# Patient Record
Sex: Male | Born: 1935 | Race: Black or African American | Hispanic: No | Marital: Married | Smoking: Never smoker
Health system: Southern US, Community
[De-identification: ages and names within clinical notes are randomized; demographics above are authoritative.]

## PROBLEM LIST (undated history)

## (undated) DIAGNOSIS — C61 Malignant neoplasm of prostate: Secondary | ICD-10-CM

## (undated) HISTORY — PX: PROSTATECTOMY: SHX69

## (undated) HISTORY — PX: BACK SURGERY: SHX140

---

## 2019-07-16 ENCOUNTER — Ambulatory Visit
Admission: EM | Admit: 2019-07-16 | Discharge: 2019-07-16 | Disposition: A | Discharge status: Home / Self Care | Payer: Medicare Other | Attending: Emergency Medicine | Admitting: Emergency Medicine

## 2019-07-16 ENCOUNTER — Other Ambulatory Visit: Payer: Self-pay

## 2019-07-16 DIAGNOSIS — M7989 Other specified soft tissue disorders: Secondary | ICD-10-CM | POA: Diagnosis not present

## 2019-07-16 DIAGNOSIS — T63481A Toxic effect of venom of other arthropod, accidental (unintentional), initial encounter: Secondary | ICD-10-CM

## 2019-07-16 DIAGNOSIS — L03012 Cellulitis of left finger: Secondary | ICD-10-CM

## 2019-07-16 HISTORY — DX: Malignant neoplasm of prostate: C61

## 2019-07-16 MED ORDER — PREDNISONE 20 MG PO TABS
40.0000 mg | ORAL_TABLET | Freq: Every day | ORAL | 0 refills | Status: DC
Start: 2019-07-16 — End: 2020-10-03

## 2019-07-16 MED ORDER — DOXYCYCLINE HYCLATE 100 MG PO CAPS
100.0000 mg | ORAL_CAPSULE | Freq: Two times a day (BID) | ORAL | 0 refills | Status: DC
Start: 2019-07-16 — End: 2020-10-03

## 2019-07-16 MED ORDER — MUPIROCIN CALCIUM 2 % EX CREA
1.0000 | TOPICAL_CREAM | Freq: Two times a day (BID) | CUTANEOUS | 0 refills | Status: DC
Start: 2019-07-16 — End: 2021-02-27

## 2019-07-16 NOTE — ED Triage Notes (Signed)
Pt reports he was sitting outside on Saturday when he thinks he got stung or bitten by something. A little knot on his left hand index finger. Began to turn red and swell. Warm to touch and tender.

## 2019-07-16 NOTE — Discharge Instructions (Signed)
Take medication as prescribed. Rest. Drink plenty of fluids.  Elevate.  Monitor.  Follow up with your primary care physician this week as needed. Return to Urgent care for new or worsening concerns.

## 2019-07-16 NOTE — ED Provider Notes (Signed)
[facility] ____________________________________________  Time seen: Approximately 1:02 PM  I have reviewed the triage vital signs and the nursing notes.   HISTORY  Chief Complaint Finger Swelling   HPI Jamie Brennan is a 86 y.o. male presenting for evaluation of left index finger swelling.  Patient reports this past Saturday afternoon he was sitting outside and felt a bite or sting to his left index finger, and reports when he looked he saw a small dot that was itchy and somewhat swollen.  Reports as that day progressed he had increased swelling to the same area.  Reports swelling is still present.  States somewhat itchy and somewhat red.  Denies any drainage.  Denies any paresthesias, decreased strength.  Reports some decrease movement due to swelling.  Denies fall, or other injury.  Reports immunizations are up-to-date.  Denies diabetes, renal insufficiency or recent antibiotic use.  Reports otherwise doing well.  Denies chest pain, shortness of breath, other edema.  [place]P: in [place]   Past Medical History:  Diagnosis Date   Prostate cancer ([place])     There are no problems to display for this patient.   Past Surgical History:  Procedure Laterality Date   BACK SURGERY     PROSTATECTOMY       No current facility-administered medications for this encounter.  Current Outpatient Medications:    doxycycline (VIBRAMYCIN) 100 MG capsule, Take 1 capsule (100 mg total) by mouth 2 (two) times daily., Disp: 20 capsule, Rfl: 0   mupirocin cream (BACTROBAN) 2 %, Apply 1 application topically 2 (two) times daily. For 5 days, Disp: 15 g, Rfl: 0   predniSONE (DELTASONE) 20 MG tablet, Take 2 tablets (40 mg total) by mouth daily., Disp: 10 tablet, Rfl: 0  Allergies Patient has no known allergies.  History reviewed. No pertinent family history.  Social History Social History   Tobacco Use   Smoking status: Never Smoker   Smokeless tobacco: Never Used  Brewing technologist Use: Never used  Substance Use Topics   Alcohol use: Not Currently   Drug use: Never    Review of Systems Constitutional: No fever ENT: Denies difficulty swallowing or throat swelling. Cardiovascular: Denies chest pain. Respiratory: Denies shortness of breath. Gastrointestinal: No abdominal pain.  Skin: Positive skin changes.  ____________________________________________   PHYSICAL EXAM:  VITAL SIGNS: ED Triage Vitals  Enc Vitals Group     BP 07/16/19 1222 (!) 160/82     Pulse Rate 07/16/19 1222 74     Resp 07/16/19 1222 17     Temp 07/16/19 1222 97.8 F (36.6 C)     Temp Source 07/16/19 1222 Oral     SpO2 07/16/19 1222 100 %     Weight 07/16/19 1220 145 lb (65.8 kg)     Height 07/16/19 1220 5\' 8"  (1.727 m)     Head Circumference --      Peak Flow --      Pain Score 07/16/19 1219 5     Pain Loc --      Pain Edu? --      Excl. in [place]? --     Constitutional: Alert and oriented. Well appearing and in no acute distress. Eyes: Conjunctivae are normal. ENT      Head: Normocephalic and atraumatic. Cardiovascular: Good peripheral circulation. Respiratory: Normal respiratory effort without tachypnea nor retractions. Musculoskeletal: Steady gait. Neurologic:  Normal speech and language.  Speech is normal. No gait instability.  Skin:  Skin is warm, dry.  Except: Left index finger lateral aspect of middle phalanx punctum present without foreign body noted with surrounding dorsal edema, no circumferential edema, dorsal erythema present, no circumferential erythema, no point bony tenderness, mildly diffuse tender, good distal resisted flexion and extension, normal distal sensation and capillary refill.  Left hand otherwise nontender. Psychiatric: Mood and affect are normal. Speech and behavior are normal. Patient exhibits appropriate insight and judgment   ___________________________________________   LABS (all labs ordered are listed, but only abnormal results are  displayed)  Labs Reviewed - No data to display ____________________________________________   PROCEDURES Procedures    INITIAL IMPRESSION / ASSESSMENT AND PLAN / ED COURSE  Pertinent labs & imaging results that were available during my care of the patient were reviewed by me and considered in my medical decision making (see chart for details).  Well-appearing patient.  No acute distress.  Suspect left index finger local reaction from insect bite or sting with concern of secondary cellulitis.  Will treat with oral doxycycline, topical Bactroban and oral prednisone.  Encouraged elevation, cool compress and monitoring.  Discussed follow-up and return parameters.Discussed indication, risks and benefits of medications with patient.   Discussed follow up with Primary care physician this week. Discussed follow up and return parameters including no resolution or any worsening concerns. Patient verbalized understanding and agreed to plan.   ____________________________________________   FINAL CLINICAL IMPRESSION(S) / ED DIAGNOSES  Final diagnoses:  Swelling of left index finger  Cellulitis of left index finger  Insect stings, accidental or unintentional, initial encounter     ED Discharge Orders         Ordered    predniSONE (DELTASONE) 20 MG tablet  Daily     Discontinue  Reprint     07/16/19 1301    doxycycline (VIBRAMYCIN) 100 MG capsule  2 times daily     Discontinue  Reprint     07/16/19 1301    mupirocin cream (BACTROBAN) 2 %  2 times daily     Discontinue  Reprint     07/16/19 1301           Note: This dictation was prepared with Dragon dictation along with smaller phrase technology. Any transcriptional errors that result from this process are unintentional.         [name], NP 07/16/19 1349

## 2020-10-03 ENCOUNTER — Encounter: Payer: Self-pay | Admitting: Urology

## 2020-10-03 ENCOUNTER — Ambulatory Visit: Payer: Medicare Other | Admitting: Urology

## 2020-10-03 ENCOUNTER — Other Ambulatory Visit: Payer: Self-pay

## 2020-10-03 VITALS — BP 130/78 | HR 71 | Ht 70.0 in | Wt 145.0 lb

## 2020-10-03 DIAGNOSIS — N320 Bladder-neck obstruction: Secondary | ICD-10-CM

## 2020-10-03 DIAGNOSIS — R339 Retention of urine, unspecified: Secondary | ICD-10-CM | POA: Diagnosis not present

## 2020-10-03 DIAGNOSIS — Z8546 Personal history of malignant neoplasm of prostate: Secondary | ICD-10-CM | POA: Diagnosis not present

## 2020-10-03 NOTE — Progress Notes (Signed)
10/03/2020 9:23 AM   Jamie Brennan Dec 22, 1931 [id number]  Referring provider: No referring provider defined for this encounter.  Chief Complaint  Patient presents with   Hematuria    HPI: Jamie Brennan presents for follow-up of a recent episode of hematuria, urinary retention and bilateral hydronephrosis.  Hospitalized in [place] late August 2022 after a syncopal episode/fall.  Creatinine was 3.3 and ultrasound with bilateral hydronephrosis and bladder distention Foley catheter was unable to be placed and was seen by urology with Foley catheter placement under cystoscopic guidance 09/09/2020 Cystoscopy note was reviewed and was noted to have a bladder neck contracture which was dilated over a wire to 24 [place] and a 20 [place] catheter was placed Follow-up CT 09/10/2020 with minimal bilateral hydronephrosis Prior history of prostate cancer s/p radical retropubic prostatectomy 2004 and subsequent radiation Hematuria resolved and Foley was removed prior to discharge on 09/14/2020 Discharge to rehab and was transported to the ED 09/17/2020 for hematuria and a creatinine of 2.5.  CT showed severe bilateral hydronephrosis and a distended bladder.  A Foley catheter was unable to be placed however was placed by urology. His niece lives locally.  He has no family and is living with her until he is able to return to [place] He has had no problems with the indwelling catheter   PMH: Past Medical History:  Diagnosis Date   Prostate cancer [facility])     Surgical History: Past Surgical History:  Procedure Laterality Date   BACK SURGERY     PROSTATECTOMY      Home Medications:  Allergies as of 10/03/2020   No Known Allergies      Medication List        Accurate as of October 03, 2020  9:23 AM. If you have any questions, ask your nurse or doctor.          STOP taking these medications    doxycycline 100 MG capsule Commonly known as: VIBRAMYCIN Stopped  by: Abbie Sons, MD   predniSONE 20 MG tablet Commonly known as: DELTASONE Stopped by: Abbie Sons, MD       TAKE these medications    mupirocin cream 2 % Commonly known as: BACTROBAN Apply 1 application topically 2 (two) times daily. For 5 days        Allergies: No Known Allergies  Family History: No family history on file.  Social History:  reports that he has never smoked. He has never used smokeless tobacco. He reports that he does not currently use alcohol. He reports that he does not use drugs.   Physical Exam: BP 130/78   Pulse 71   Ht 5\' 10"  (1.778 m)   Wt 145 lb (65.8 kg)   BMI 20.81 kg/m   Constitutional:  Alert and oriented, No acute distress. HEENT: [place] AT, moist mucus membranes.  Trachea midline, no masses. Cardiovascular: No clubbing, cyanosis, or edema. Respiratory: Normal respiratory effort, no increased work of breathing. GI: Abdomen is soft, nontender, nondistended, no abdominal masses GU: No CVA tenderness Lymph: No cervical or inguinal lymphadenopathy. Skin: No rashes, bruises or suspicious lesions. Neurologic: Grossly intact, no focal deficits, moving all 4 extremities. Psychiatric: Normal mood and affect.   Assessment & Plan:    1.  Urinary retention Since it is Friday did not recommend catheter removal today and he will follow-up next week for catheter removal/voiding trial  2.  Bladder neck contracture  3.  History prostate cancer   Abbie Sons, MD  [facility] [address] [place], [place] [address] [phone]

## 2020-10-06 ENCOUNTER — Ambulatory Visit: Payer: Medicare Other | Admitting: Physician Assistant

## 2020-10-06 ENCOUNTER — Other Ambulatory Visit: Payer: Self-pay

## 2020-10-06 DIAGNOSIS — R339 Retention of urine, unspecified: Secondary | ICD-10-CM | POA: Diagnosis not present

## 2020-10-06 LAB — BLADDER SCAN AMB NON-IMAGING

## 2020-10-06 NOTE — Progress Notes (Signed)
   10/06/2020 10:09 AM   Jamie Brennan [birth year] [id number]  CC: Chief Complaint  Patient presents with   Urinary Retention   HPI: Jamie Brennan is a 87 y.o. male with PMH prostate cancer and a recent history of recurrent urinary retention with bilateral hydronephrosis and hematuria with cystoscopy findings of bladder neck contracture, now s/p dilation who presents today for voiding trial.  He is accompanied today by his niece, who contributes to HPI.  Today he reports feeling well with no acute concerns.  Foley catheter removed in clinic in the morning, see procedure note for details.  Patient returns to clinic in the afternoon.  He drank at least 24 ounces of fluid and has been able to urinate.  PVR 60mL.  PMH: Past Medical History:  Diagnosis Date   Prostate cancer [facility])     Surgical History: Past Surgical History:  Procedure Laterality Date   BACK SURGERY     PROSTATECTOMY      Home Medications:  Allergies as of 10/06/2020   No Known Allergies      Medication List        Accurate as of October 06, 2020 10:09 AM. If you have any questions, ask your nurse or doctor.          mupirocin cream 2 % Commonly known as: BACTROBAN Apply 1 application topically 2 (two) times daily. For 5 days        Allergies:  No Known Allergies  Family History: No family history on file.  Social History:   reports that he has never smoked. He has never used smokeless tobacco. He reports that he does not currently use alcohol. He reports that he does not use drugs.  Physical Exam: There were no vitals taken for this visit.  Constitutional:  Alert and oriented, no acute distress, nontoxic appearing HEENT: [place], AT Cardiovascular: No clubbing, cyanosis, or edema Respiratory: Normal respiratory effort, no increased work of breathing Skin: No rashes, bruises or suspicious lesions Neurologic: Grossly intact, no focal deficits, moving all 4 extremities Psychiatric:  Normal mood and affect  Laboratory Data: Results for orders placed or performed in visit on 10/06/20  BLADDER SCAN AMB NON-IMAGING  Result Value Ref Range   Scan Result 70mL    Catheter Removal  Patient is present today for a catheter removal.  21ml of water was drained from the balloon. A 20FR Council tip foley cath was removed from the bladder no complications were noted . Patient tolerated well.  Performed by: Debroah Loop, PA-C   Assessment & Plan:   1. Urinary retention Voiding trial passed.  We will bring him back in 1 month to recheck his residual.  In the interim, we discussed return precautions including difficulty urinating, lower abdominal pain, and abdominal distention.  He expressed understanding. - BLADDER SCAN AMB NON-IMAGING   Return in about 4 weeks (around 11/03/2020) for Repeat PVR.  Debroah Loop, PA-C  [facility] [address] [place], [place] [address] [phone]

## 2020-11-03 ENCOUNTER — Ambulatory Visit: Payer: Medicare Other | Admitting: Physician Assistant

## 2020-11-04 ENCOUNTER — Encounter: Payer: Self-pay | Admitting: Physician Assistant

## 2020-11-11 ENCOUNTER — Ambulatory Visit: Payer: Medicare Other | Attending: Internal Medicine

## 2020-11-11 ENCOUNTER — Encounter: Payer: Self-pay | Admitting: Physical Therapy

## 2020-11-11 DIAGNOSIS — R2689 Other abnormalities of gait and mobility: Secondary | ICD-10-CM | POA: Insufficient documentation

## 2020-11-11 NOTE — Therapy (Addendum)
[facility] [address], [place], [address] Phone: [phone]   Fax:  [phone]  Physical Therapy Evaluation  Patient Details  Name: Jamie Brennan MRN: [id number] Date of Birth: Aug 26, 1931 No data recorded  Encounter Date: 11/11/2020   PT End of Session - 11/11/20 1551     Visit Number 1    Number of Visits 17    Date for PT Re-Evaluation 01/06/21    Authorization Type BCBS Medicare    Authorization Time Period 11/11/20-01/06/21    Authorization - Visit Number --    Progress Note Due on Visit 10    PT Start Time 1300    PT Stop Time 1348    PT Time Calculation (min) 48 min    Equipment Utilized During Treatment Gait belt    Activity Tolerance Patient tolerated treatment well    Behavior During Therapy WFL for tasks assessed/performed             Past Medical History:  Diagnosis Date   Prostate cancer [facility])     Past Surgical History:  Procedure Laterality Date   BACK SURGERY     PROSTATECTOMY      There were no vitals filed for this visit.    Subjective Assessment - 11/11/20 1259     Subjective Jamie Brennan is a 87 year old male presenting with conerns of LE weakness and balance following a fall on 09/06/20 and Covid-19/rhabdomyolysis diagnosis. Pt reports falling outside while trying to pick up a "piece of bread." Pt reports not having a history of falling other than one fall approximately 1 year prior attibuted to pneumonia. He presents today amb with a RW that his niece reports he does not use frequently. [name]. He lives in a multilevel home with 10 steps to enter with railing on L side. Patient reports he was independent with all ADLs prior to falling. He currently reports independence in bathing, dressing, cooking, and cleaning. He is not currently driving. He is currently living with his niece who is his  primary caregiver. He reports has a PMH of prostate cancer, prostectomy, previous lumbar surgery, and difficulty hearing. Pt denies any unexplained weight fluctuation, saddle paresthesia, loss of bowel/bladder function, or unrelenting night pain at this time.    Patient is accompained by: Family member   Niece   Pertinent History Jamie Brennan is a 87 year old male presenting with conerns of LE weakness and balance following a fall on 09/06/20 and Covid-19/rhabdomyolysis diagnosis. Pt reports falling outside while trying to pick up a "piece of bread." Pt reports not having a history of falling other than one fall approximately 1 year prior attibuted to pneumonia. He presents today amb with a RW that his niece reports he does not use frequently. [name]. He lives in a multilevel home with 10 steps to enter with railing on L side. Patient reports he was independent with all ADLs prior to falling. He currently reports independence in bathing, dressing, cooking, cleaning. He is not currently driving. He is currently living with his niece who is his primary caregiver. He reports has a PMH of prostate cancer, prostectomy, previous lumbar surgery, and difficulty hearing. Pt denies any unexplained weight fluctuation, saddle paresthesia, loss of bowel/bladder function, or unrelenting night pain at this time.    Limitations Walking;Lifting;House hold activities  How long can you sit comfortably? hours    How long can you stand comfortably? 30-86min    How long can you walk comfortably? 30-45    Patient Stated Goals Living independent    Currently in Pain? No/denies              Hip AROM L/R  [facility] except extension approximately 50% limited bilaterally   Hip MMT L/R  Flex 4/4  Abd 4-/4-  Add 5/5  IR 4+/4+  ER 4/4  Knee MMT L/R  Flex: 4+/4+ Ext: 5/5   Knee AROM  WFL   Ankle AROM  WFL  Ankle MMT  WFL  Mental  Status Patient's fund of knowledge is within normal limits for educational level.  Sensation Grossly intact to light touch bilateral LEs as determined by testing dermatomes L2-S2 respectively except at L4 on LLE Proprioception and hot/cold testing deferred on this date  Posture  Rounded shoulders, forward head, decreased lumbar lordosis, increased thoracic kyphosis  Test and Measures:  TUG: 16 sec with bilat UE support to achieve standing  5xSTS: 16.17 sec with bilat UE support to achieve standing  10MWT:14.21 sec = 0.70 m/s  Picking object from floor: CGA with evidenced bilat knee valgus with squatting  Standing unsupported: able to stand > 1 min without support  Gait: scissoring gait, decreased speed, decreased step length/width, increased lateral trunk leaning  Stair negotiation: step to pattern with reliance of moderate BUE support to maintain balance and lack of eccentric control  Single leg balance: unable to perform     Objective measurements completed on examination: See above findings.      Ther-Ex PT reviewed the following HEP with patient with patient able to demonstrate a set of the following with min cuing for correction needed. PT educated patient on parameters of therex (how/when to inc/decrease intensity, frequency, rep/set range, stretch hold time, and purpose of therex) with verbalized understanding.    Sit to stands 3 x 8-10 reps 7days/week Standing Hip ABD 3 x 8-10 reps 7day/week Walking with RW  7days/week   *Patient educated on risk of falling and need of utilization of RW for safety with household and community ambulation. PT adjusted RW height and cued patient for correct walker proximity.        PT Education - 11/11/20 1552     Education Details Patient was educated on diagnosis, anatomy and pathology involved, prognosis, role of PT, and was given an HEP, demonstrating exercise with proper form following verbal and tactile cues, and was  given a paper hand out to continue exercise at home. Pt was educated on and agreed to plan of care.    Person(s) Educated Patient;Caregiver(s)    Methods Explanation;Demonstration    Comprehension Verbalized understanding;Returned demonstration              PT Short Term Goals - 11/11/20 1715       PT SHORT TERM GOAL #1   Title Pt will demonstrate independence with HEP to improve LE strength and balance for increased ability to participate with ADLs    Baseline HEP given    Time 4    Period Weeks    Status New    Target Date 12/09/20               PT Long Term Goals - 11/11/20 1718       PT LONG TERM GOAL #1   Title Patient will increase FOTO score to ** to indicate predicted increase in  functional mobility to complete ADLs    Baseline 11/11/20:    Time 8    Period Weeks    Status New    Target Date 01/06/21      PT LONG TERM GOAL #2   Title Pt will increase gait speed in 10MWT to >0.55m/s to demonstrate appropriate speed for limited community ambulation    Baseline 11/11/20: 0.70 m/s    Time 8    Period Weeks    Status New    Target Date 01/06/21      PT LONG TERM GOAL #3   Title Pt will perform 5XSTS within 12 seconds without UE support in order to demonstrate improved LE strength and decrease the likelihood of falling    Baseline 11/11/20: 16 sec BUE support                    Plan - 11/11/20 1635     Clinical Impression Statement Patient is a pleasant 87 y.o. male referred to PT for gait training and balance. Pt presents with decreased LE strength, decreased hip ROM, decreased activity tolerance, balance, abnormal gait, and postural dysfunction. Activity limitations with walking, standing, stair negotiation, sit to stand transfer, and driving prevent full participation with ADLs and community activities. Patient is at increased fall risk evidenced by TUG and 5XSTS and will benefit from skilled PT to address these impairments, achieve set goals, and  decrease likelihood of falling.    Personal Factors and Comorbidities Age;Comorbidity 1    Comorbidities prostate cancer    Examination-Activity Limitations Bathing;Dressing;Stairs;Locomotion Level;Stand;Lift;Transfers    Examination-Participation Restrictions Community Activity;Yard Work;Cleaning;Meal Prep;Driving    Stability/Clinical Decision Making Evolving/Moderate complexity    Clinical Decision Making Moderate    Rehab Potential Fair    PT Frequency 2x / week    PT Duration 8 weeks    PT Treatment/Interventions ADLs/Self Care Home Management;Cryotherapy;Traction;Gait training;Stair training;Functional mobility training;Therapeutic activities;Neuromuscular re-education;Balance training;Therapeutic exercise;Patient/family education;Manual techniques;Passive range of motion;Dry needling    PT Next Visit Plan review HEP/FGA/BERG goal    PT Home Exercise Plan Sit to Stands, standing hip ABD, walking with RW    Consulted and Agree with Plan of Care Patient             Patient will benefit from skilled therapeutic intervention in order to improve the following deficits and impairments:  Abnormal gait, Decreased balance, Decreased endurance, Decreased mobility, Difficulty walking, Pain, Postural dysfunction, Decreased strength, Decreased safety awareness, Decreased activity tolerance, Improper body mechanics, Decreased range of motion  Visit Diagnosis: Other abnormalities of gait and mobility     Problem List There are no problems to display for this patient.  Jamie Brennan, SPT  Jamie Brennan, Student-PT 11/11/2020, 5:33 PM  [facility] [address], [place], [address] Phone: [phone]   Fax:  [phone]  Name: Jamie Brennan MRN: [id number] Date of Birth: 1931/05/09

## 2020-11-18 ENCOUNTER — Ambulatory Visit: Payer: Medicare Other | Admitting: Physical Therapy

## 2020-11-18 ENCOUNTER — Encounter: Payer: Self-pay | Admitting: Physical Therapy

## 2020-11-18 DIAGNOSIS — R2689 Other abnormalities of gait and mobility: Secondary | ICD-10-CM | POA: Diagnosis not present

## 2020-11-18 NOTE — Therapy (Signed)
[name] [address], [place], [address] Phone: [phone]   Fax:  [phone]  Physical Therapy Treatment  Patient Details  Name: Jamie Brennan MRN: [id number] Date of Birth: 08-27-1931 Referring Provider (PT): Tama High III   Encounter Date: 11/18/2020   PT End of Session - 11/18/20 1645     Visit Number 2    Number of Visits 17    Date for PT Re-Evaluation 01/06/21    Authorization Type BCBS Medicare    Authorization Time Period 11/11/20-01/06/21    Authorization - Visit Number 2    Progress Note Due on Visit 10    PT Start Time 1300    PT Stop Time 1340    PT Time Calculation (min) 40 min    Equipment Utilized During Treatment Gait belt    Activity Tolerance Patient tolerated treatment well    Behavior During Therapy WFL for tasks assessed/performed             Past Medical History:  Diagnosis Date   Prostate cancer [facility])     Past Surgical History:  Procedure Laterality Date   BACK SURGERY     PROSTATECTOMY      There were no vitals filed for this visit.   Subjective Assessment - 11/18/20 1309     Subjective Pt reports active participation with HEP. He    Pertinent History Jamie Brennan is a 87 year old male presenting with conerns of LE weakness and balance following a fall on 09/06/20 and Covid-19/rhabdomyolysis diagnosis. Pt reports falling outside while trying to pick up a "piece of bread." Pt reports not having a history of falling other than one fall approximately 1 year prior attibuted to pneumonia. He presents today amb with a RW that his niece reports he does not use frequently. [name]. He lives in a multilevel home with 10 steps to enter with railing on L side. Patient reports he was independent with all ADLs prior to falling. He currently reports independence in bathing, dressing, cooking,  cleaning. He is not currently driving. He is currently living with his niece who is his primary caregiver. He reports has a PMH of prostate cancer, prostectomy, previous lumbar surgery, and difficulty hearing. Pt denies any unexplained weight fluctuation, saddle paresthesia, loss of bowel/bladder function, or unrelenting night pain at this time.    Limitations Walking;Lifting;House hold activities    How long can you sit comfortably? hours    How long can you stand comfortably? 30-40min    How long can you walk comfortably? 30-45    Patient Stated Goals Living independent            Therex   Nu step level 2 x 5 min seat 9 SPM >80  Sit to Stands 2 x 10 reps with cues for decreased use of UE support and pushing off with knee to assist  Standing Hip ABD 2 x 10 reps with cueing and demonstration to complete with good carry over  Therapeutic Activity Family Education/Training: Stair negotiation x 4 trials x 4 6'" steps; PT explained, demonstrated proper guarding, form, and positioning with patient caregiver able to return demonstration and verbalize understanding. Pt and caregiver educated on guarding from behind going up/ and from the front going going down.   Dynamic gait testing FGA: 11/30 score Education on findings from FGA and carry over into real life gait  challenges                    PT Education - 11/18/20 1659     Education Details Pt and caregiver educated on safe stair negotiation.    Person(s) Educated Patient;Caregiver(s)    Methods Explanation;Demonstration    Comprehension Verbalized understanding;Returned demonstration              PT Short Term Goals - 11/11/20 1715       PT SHORT TERM GOAL #1   Title Pt will demonstrate independence with HEP to improve LE strength and balance for increased ability to participate with ADLs    Baseline HEP given    Time 4    Period Weeks    Status New    Target Date 12/09/20               PT Long  Term Goals - 11/18/20 1709       PT LONG TERM GOAL #1   Title Patient will increase FOTO score to ** to indicate predicted increase in functional mobility to complete ADLs    Baseline 11/11/20:    Time 8    Period Weeks    Status New      PT LONG TERM GOAL #2   Title Pt will increase gait speed in 10MWT to >0.62m/s to demonstrate appropriate speed for limited community ambulation    Baseline 11/11/20: 0.70 m/s    Time 8    Period Weeks    Status New      PT LONG TERM GOAL #3   Title Pt will perform 5XSTS within 12 seconds without UE support in order to demonstrate improved LE strength and decrease the likelihood of falling    Baseline 11/11/20: 16 sec BUE support    Time 8    Period Weeks    Status New    Target Date 01/06/21      PT LONG TERM GOAL #4   Title Patient will demonstrate decreased risk for falls with function gait assessment by performing 15/30. (4+MCID)    Baseline 11/18/20: 11/30    Time 8    Period Weeks    Status New    Target Date 01/06/21                   Plan - 11/18/20 1648     Clinical Impression Statement PT initiated LE strengthening by reviewing patient HEP. PT also performed FGA with patient scoring 11/30 indicating increased fall risk in community-dwelling older adults. PT also educated caregiver in safely guarding patient with negotiation of stairs. PT explained, demonstrated proper guarding, form, and positioning with patient caregiver able to return demonstration and verbalize understanding. Pt is very motivated and able to correct with multimodal cueing. Pt will continue to benefit from skilled PT to achieve set goals. Continue PT POC.    Personal Factors and Comorbidities Age;Comorbidity 1    Comorbidities prostate cancer    Examination-Activity Limitations Bathing;Dressing;Stairs;Locomotion Level;Stand;Lift;Transfers    Examination-Participation Restrictions Community Activity;Yard Work;Cleaning;Meal Prep;Driving    Stability/Clinical  Decision Making Evolving/Moderate complexity    Clinical Decision Making Moderate    Rehab Potential Fair    PT Frequency 2x / week    PT Duration 8 weeks    PT Treatment/Interventions ADLs/Self Care Home Management;Cryotherapy;Traction;Gait training;Stair training;Functional mobility training;Therapeutic activities;Neuromuscular re-education;Balance training;Therapeutic exercise;Patient/family education;Manual techniques;Passive range of motion;Dry needling    PT Next Visit Plan Gait training, balance, strength training    PT Home  Exercise Plan Sit to Stands, standing hip ABD, walking with RW    Consulted and Agree with Plan of Care Patient             Patient will benefit from skilled therapeutic intervention in order to improve the following deficits and impairments:  Abnormal gait, Decreased balance, Decreased endurance, Decreased mobility, Difficulty walking, Pain, Postural dysfunction, Decreased strength, Decreased safety awareness, Decreased activity tolerance, Improper body mechanics, Decreased range of motion  Visit Diagnosis: Other abnormalities of gait and mobility     Problem List There are no problems to display for this patient.    Durwin Reges DPT Sharion Settler, SPT  Durwin Reges, PT 11/19/2020, 10:16 AM  [facility] [address], [place], [address] Phone: [phone]   Fax:  [phone]  Name: Jamie Brennan MRN: [id number] Date of Birth: 1931-11-26

## 2020-11-20 ENCOUNTER — Ambulatory Visit: Payer: Medicare Other | Admitting: Physical Therapy

## 2020-11-20 ENCOUNTER — Encounter: Payer: Self-pay | Admitting: Physical Therapy

## 2020-11-20 DIAGNOSIS — R2689 Other abnormalities of gait and mobility: Secondary | ICD-10-CM

## 2020-11-20 NOTE — Therapy (Signed)
[facility] [address], [place], [address] Phone: [phone]   Fax:  [phone]  Physical Therapy Treatment  Patient Details  Name: Jamie Brennan MRN: [id number] Date of Birth: October 21, 1931 Referring Provider (PT): Tama High III   Encounter Date: 11/20/2020   PT End of Session - 11/20/20 1523     Visit Number 3    Number of Visits 17    Date for PT Re-Evaluation 01/06/21    Authorization Type BCBS Medicare    Authorization Time Period 11/11/20-01/06/21    Authorization - Visit Number 3    Progress Note Due on Visit 10    PT Start Time 9924    PT Stop Time 1558    PT Time Calculation (min) 41 min    Equipment Utilized During Treatment Gait belt    Activity Tolerance Patient tolerated treatment well    Behavior During Therapy WFL for tasks assessed/performed             Past Medical History:  Diagnosis Date   Prostate cancer [facility])     Past Surgical History:  Procedure Laterality Date   BACK SURGERY     PROSTATECTOMY      There were no vitals filed for this visit.   Subjective Assessment - 11/20/20 1536     Subjective Pt reports feeling well and denies having any falls.    Pertinent History Jamie Brennan is a 87 year old male presenting with conerns of LE weakness and balance following a fall on 09/06/20 and Covid-19/rhabdomyolysis diagnosis. Pt reports falling outside while trying to pick up a "piece of bread." Pt reports not having a history of falling other than one fall approximately 1 year prior attibuted to pneumonia. He presents today amb with a RW that his niece reports he does not use frequently. [name]. He lives in a multilevel home with 10 steps to enter with railing on L side. Patient reports he was independent with all ADLs prior to falling. He currently reports independence in bathing, dressing,  cooking, cleaning. He is not currently driving. He is currently living with his niece who is his primary caregiver. He reports has a PMH of prostate cancer, prostectomy, previous lumbar surgery, and difficulty hearing. Pt denies any unexplained weight fluctuation, saddle paresthesia, loss of bowel/bladder function, or unrelenting night pain at this time.    Limitations Walking;Lifting;House hold activities    How long can you sit comfortably? hours    How long can you stand comfortably? 30-36min    How long can you walk comfortably? 30-45    Patient Stated Goals Living independent             Therex   Nu Step L2 x 5 min seat Level 9 for LE strengthening   Alternating toe tap to 8" step with emphasis on increased dorsiflexion 2 x 10-12 reps each LE  Sit to Stands 1 x 10 reps without UE use; cue to decreased LE assist on edge of chair  Lateral step downs from 4' step 2 x 10 reps each LE with cues for eccentric control; patient continues to rely heavy on single UE and lack eccentric control  Sit to Stands 1 x 10 reps without UE use   Therapeutic Activity     Ambulation (approximately 287ft) on level, unlevel side walk, ramp , and 5 8" steps with utilization of 1  handrail. Pt needed CGA to safely negotiate surfaces; Pt demonstrated slow shuffling gait with decreases dorsiflexion and foot clearance L>R through swing phase of gait. Able to correct for short periods of time for cuing,cuing needed for all clearings (curbs, sidewalk cracks, doorway entry) Pt also demonstrated evidence of imbalance with increased deviations needing CGA for safety   Stair negotiation 12 4" steps with supervision<>CGA with reciprocal gait pattern and single UE support                   PT Education - 11/20/20 1615     Education Details Pt educated on step height and toe clearance and importance to increase safety with walking.    Person(s) Educated Patient    Methods  Explanation;Demonstration    Comprehension Verbalized understanding;Returned demonstration              PT Short Term Goals - 11/11/20 1715       PT SHORT TERM GOAL #1   Title Pt will demonstrate independence with HEP to improve LE strength and balance for increased ability to participate with ADLs    Baseline HEP given    Time 4    Period Weeks    Status New    Target Date 12/09/20               PT Long Term Goals - 11/18/20 1709       PT LONG TERM GOAL #1   Title Patient will increase FOTO score to ** to indicate predicted increase in functional mobility to complete ADLs    Baseline 11/11/20:    Time 8    Period Weeks    Status New      PT LONG TERM GOAL #2   Title Pt will increase gait speed in 10MWT to >0.58m/s to demonstrate appropriate speed for limited community ambulation    Baseline 11/11/20: 0.70 m/s    Time 8    Period Weeks    Status New      PT LONG TERM GOAL #3   Title Pt will perform 5XSTS within 12 seconds without UE support in order to demonstrate improved LE strength and decrease the likelihood of falling    Baseline 11/11/20: 16 sec BUE support    Time 8    Period Weeks    Status New    Target Date 01/06/21      PT LONG TERM GOAL #4   Title Patient will demonstrate decreased risk for falls with function gait assessment by performing 15/30. (4+MCID)    Baseline 11/18/20: 11/30    Time 8    Period Weeks    Status New    Target Date 01/06/21                   Plan - 11/20/20 1616     Clinical Impression Statement PT continued LE strengthening and gaiti/stair training in order to promote safe navigation at home and community. Pt educated patient on importance of step height and toe clearance for safe ambulation. Pt continues to demonstrate slow shuffling gait with decreased dorsiflexion and foot clearance L>R through swing phase of gait. Pt also demonstrated evidence of imbalance with increased deviations on unlevel surfaces needing  CGA. Continue PT POC as able.    Personal Factors and Comorbidities Age;Comorbidity 1    Comorbidities prostate cancer    Examination-Activity Limitations Bathing;Dressing;Stairs;Locomotion Level;Stand;Lift;Transfers    Examination-Participation Restrictions Community Activity;Yard Work;Cleaning;Meal Prep;Driving    Stability/Clinical Decision Making Evolving/Moderate complexity  Clinical Decision Making Moderate    Rehab Potential Fair    PT Frequency 2x / week    PT Duration 8 weeks    PT Treatment/Interventions ADLs/Self Care Home Management;Cryotherapy;Traction;Gait training;Stair training;Functional mobility training;Therapeutic activities;Neuromuscular re-education;Balance training;Therapeutic exercise;Patient/family education;Manual techniques;Passive range of motion;Dry needling    PT Next Visit Plan Gait training, balance, strength training    PT Home Exercise Plan Sit to Stands, standing hip ABD, walking with RW    Consulted and Agree with Plan of Care Patient             Patient will benefit from skilled therapeutic intervention in order to improve the following deficits and impairments:  Abnormal gait, Decreased balance, Decreased endurance, Decreased mobility, Difficulty walking, Pain, Postural dysfunction, Decreased strength, Decreased safety awareness, Decreased activity tolerance, Improper body mechanics, Decreased range of motion  Visit Diagnosis: Other abnormalities of gait and mobility     Problem List There are no problems to display for this patient.   Durwin Reges DPT Sharion Settler, SPT  Durwin Reges, PT 11/21/2020, 9:57 AM  [facility] [address], [place], [address] Phone: [phone]   Fax:  [phone]  Name: Jamie Brennan MRN: [id number] Date of Birth: 1931/11/07

## 2020-11-25 ENCOUNTER — Ambulatory Visit: Payer: Medicare Other | Admitting: Physical Therapy

## 2020-11-25 ENCOUNTER — Encounter: Payer: Self-pay | Admitting: Physical Therapy

## 2020-11-25 DIAGNOSIS — R2689 Other abnormalities of gait and mobility: Secondary | ICD-10-CM | POA: Diagnosis not present

## 2020-11-25 NOTE — Therapy (Signed)
[facility] [address] [place], [address] Phone: [phone]   Fax:  [phone]  Physical Therapy Treatment  Patient Details  Name: Jamie Brennan MRN: [id number] Date of Birth: [birth year] Referring Provider (PT): Tama High III   Encounter Date: 11/25/2020   PT End of Session - 11/25/20 1349     Visit Number 4    Number of Visits 17    Date for PT Re-Evaluation 01/06/21    Authorization Type BCBS Medicare    Authorization Time Period 11/11/20-01/06/21    Authorization - Visit Number 4    Progress Note Due on Visit 10    PT Start Time 5400    PT Stop Time 1423    PT Time Calculation (min) 38 min    Equipment Utilized During Treatment Gait belt    Activity Tolerance Patient tolerated treatment well    Behavior During Therapy WFL for tasks assessed/performed             Past Medical History:  Diagnosis Date   Prostate cancer [facility])     Past Surgical History:  Procedure Laterality Date   BACK SURGERY     PROSTATECTOMY      There were no vitals filed for this visit.   Subjective Assessment - 11/25/20 1347     Subjective Pt reports feeling well, denies having any falls, and is active in HEP.    Pertinent History Jamie Brennan is a 87 year old male presenting with conerns of LE weakness and balance following a fall on 09/06/20 and Covid-19/rhabdomyolysis diagnosis. Pt reports falling outside while trying to pick up a "piece of bread." Pt reports not having a history of falling other than one fall approximately 1 year prior attibuted to pneumonia. He presents today amb with a RW that his niece reports he does not use frequently. [name]. He lives in a multilevel home with 10 steps to enter with railing on L side. Patient reports he was independent with all ADLs prior to falling. He currently reports independence in  bathing, dressing, cooking, cleaning. He is not currently driving. He is currently living with his niece who is his primary caregiver. He reports has a PMH of prostate cancer, prostectomy, previous lumbar surgery, and difficulty hearing. Pt denies any unexplained weight fluctuation, saddle paresthesia, loss of bowel/bladder function, or unrelenting night pain at this time.    Limitations Walking;Lifting;House hold activities    How long can you sit comfortably? hours    How long can you stand comfortably? 30-72min    How long can you walk comfortably? 30-45    Patient Stated Goals Living independent            Therex    Nu Step L3 x 5 min seat Level 9 for LE strengthening   Omega Leg Extension 3 x 10 reps # 15 with cues for contraction through full ROM and eccentric control of wt    Toe tap to " colored dot"  with emphasis on motor control and balance 2 x 10-12 reps each LE FWD/LAT; CGA<>minA to maintain balance   Stair negotiation 12 4" steps with supervision<>CGA with reciprocal gait pattern and single/no UE support with reliance on railing for balance  Step Overs (half foam roller) 1 x 10 reps each LE with cues for bilateral foot clearance (lead & trail foot);CGA needed to maintain balance with stepping  over    FWD Eccentric step downs from 4' step 2 x 10 reps each LE with cues for eccentric control; patient continues to rely heavy on single UE and lack eccentric control  Seated Heel <> Toe tapping 1 x 12 reps with demonstration and multimodal cueing for proper technique               PT Education - 11/25/20 1348     Education Details therex form/technique    Person(s) Educated Patient    Methods Explanation;Demonstration    Comprehension Verbalized understanding;Returned demonstration              PT Short Term Goals - 11/11/20 1715       PT SHORT TERM GOAL #1   Title Pt will demonstrate independence with HEP to improve LE strength and balance for increased  ability to participate with ADLs    Baseline HEP given    Time 4    Period Weeks    Status New    Target Date 12/09/20               PT Long Term Goals - 11/18/20 1709       PT LONG TERM GOAL #1   Title Patient will increase FOTO score to ** to indicate predicted increase in functional mobility to complete ADLs    Baseline 11/11/20:    Time 8    Period Weeks    Status New      PT LONG TERM GOAL #2   Title Pt will increase gait speed in 10MWT to >0.31m/s to demonstrate appropriate speed for limited community ambulation    Baseline 11/11/20: 0.70 m/s    Time 8    Period Weeks    Status New      PT LONG TERM GOAL #3   Title Pt will perform 5XSTS within 12 seconds without UE support in order to demonstrate improved LE strength and decrease the likelihood of falling    Baseline 11/11/20: 16 sec BUE support    Time 8    Period Weeks    Status New    Target Date 01/06/21      PT LONG TERM GOAL #4   Title Patient will demonstrate decreased risk for falls with function gait assessment by performing 15/30. (4+MCID)    Baseline 11/18/20: 11/30    Time 8    Period Weeks    Status New    Target Date 01/06/21                   Plan - 11/25/20 1759     Clinical Impression Statement PT continued LE strengthening, gait/stair training, and dynamic balance training in order to promote safe navigation at home and community. Pt continued educating patient on importance of step height and toe clearance for safe ambulation. Pt continues to demonstrate slow shuffling gait with decreased dorsiflexion and foot clearance L>R through swing phase of gait. Continue PT POC as able.    Personal Factors and Comorbidities Age;Comorbidity 1    Comorbidities prostate cancer    Examination-Activity Limitations Bathing;Dressing;Stairs;Locomotion Level;Stand;Lift;Transfers    Examination-Participation Restrictions Community Activity;Yard Work;Cleaning;Meal Prep;Driving    Stability/Clinical  Decision Making Evolving/Moderate complexity    Clinical Decision Making Moderate    Rehab Potential Fair    PT Frequency 2x / week    PT Duration 8 weeks    PT Treatment/Interventions ADLs/Self Care Home Management;Cryotherapy;Traction;Gait training;Stair training;Functional mobility training;Therapeutic activities;Neuromuscular re-education;Balance training;Therapeutic exercise;Patient/family education;Manual techniques;Passive range of  motion;Dry needling    PT Next Visit Plan Gait training, balance, strength training    PT Home Exercise Plan Sit to Stands, standing hip ABD, walking with RW    Consulted and Agree with Plan of Care Patient             Patient will benefit from skilled therapeutic intervention in order to improve the following deficits and impairments:  Abnormal gait, Decreased balance, Decreased endurance, Decreased mobility, Difficulty walking, Pain, Postural dysfunction, Decreased strength, Decreased safety awareness, Decreased activity tolerance, Improper body mechanics, Decreased range of motion  Visit Diagnosis: Other abnormalities of gait and mobility     Problem List There are no problems to display for this patient.  Sharion Settler, SPT  [employer], PT 11/26/2020, 11:25 AM  [facility] [address] [place], [address] Phone: [phone]   Fax:  [phone]  Name: BARY LIMBACH MRN: [id number] Date of Birth: 09-18-1931

## 2020-11-27 ENCOUNTER — Ambulatory Visit: Payer: Medicare Other | Admitting: Physical Therapy

## 2020-11-27 ENCOUNTER — Encounter: Payer: Self-pay | Admitting: Physical Therapy

## 2020-11-27 DIAGNOSIS — R2689 Other abnormalities of gait and mobility: Secondary | ICD-10-CM | POA: Diagnosis not present

## 2020-11-27 NOTE — Therapy (Signed)
[facility] [address] [place], [address] Phone: [phone]   Fax:  [phone]  Physical Therapy Treatment  Patient Details  Name: Jamie Brennan MRN: [id number] Date of Birth: 06/24/1931 Referring Provider (PT): Tama High III   Encounter Date: 11/27/2020   PT End of Session - 11/27/20 1435     Visit Number 5    Number of Visits 17    Date for PT Re-Evaluation 01/06/21    Authorization Type BCBS Medicare    Authorization Time Period 11/11/20-01/06/21    Authorization - Visit Number 5    Progress Note Due on Visit 10    PT Start Time 1429    PT Stop Time 1510    PT Time Calculation (min) 41 min    Equipment Utilized During Treatment Gait belt    Activity Tolerance Patient tolerated treatment well    Behavior During Therapy WFL for tasks assessed/performed             Past Medical History:  Diagnosis Date   Prostate cancer [facility])     Past Surgical History:  Procedure Laterality Date   BACK SURGERY     PROSTATECTOMY      There were no vitals filed for this visit.   Subjective Assessment - 11/27/20 1428     Subjective Pt reports feeling well, denies having any falls, and is active in HEP.    Pertinent History Jamie Brennan is a 87 year old male presenting with conerns of LE weakness and balance following a fall on 09/06/20 and Covid-19/rhabdomyolysis diagnosis. Pt reports falling outside while trying to pick up a "piece of bread." Pt reports not having a history of falling other than one fall approximately 1 year prior attibuted to pneumonia. He presents today amb with a RW that his niece reports he does not use frequently. [name]. He lives in a multilevel home with 10 steps to enter with railing on L side. Patient reports he was independent with all ADLs prior to falling. He currently reports independence in  bathing, dressing, cooking, cleaning. He is not currently driving. He is currently living with his niece who is his primary caregiver. He reports has a PMH of prostate cancer, prostectomy, previous lumbar surgery, and difficulty hearing. Pt denies any unexplained weight fluctuation, saddle paresthesia, loss of bowel/bladder function, or unrelenting night pain at this time.    Limitations Walking;Lifting;House hold activities    How long can you sit comfortably? hours    How long can you stand comfortably? 30-76min    How long can you walk comfortably? 30-45    Patient Stated Goals Living independent              Therex    Recumbent Bike 2 x 5 min seat Level 11 for LE strengthening    TRX Squats 2 x 8 reps with cues for foot alignment and eccentric control;CGA   Step Overs (half foam roller) 1 x 10 reps each LE with cues for bilateral foot clearance (lead & trail foot);CGA needed to maintain balance with stepping over    FWD/BWD step downs from 6' step 2 x 10 reps each LE with cues for eccentric control; patient continues to rely heavy on single UE and lack eccentric control   Seated Heel <> Toe tapping 1 x 12 reps with demonstration and multimodal cueing for proper technique  AMB  approximately 300 ft with  instructed variability in gait speed (fast,slow,self-selected speed) with emphasis on turning around 360 deg x 4; CGA            PT Education - 11/27/20 1434     Education Details therex form/technique    Person(s) Educated Patient    Methods Explanation;Demonstration    Comprehension Verbalized understanding;Returned demonstration              PT Short Term Goals - 11/11/20 1715       PT SHORT TERM GOAL #1   Title Pt will demonstrate independence with HEP to improve LE strength and balance for increased ability to participate with ADLs    Baseline HEP given    Time 4    Period Weeks    Status New    Target Date 12/09/20               PT Long Term  Goals - 11/18/20 1709       PT LONG TERM GOAL #1   Title Patient will increase FOTO score to ** to indicate predicted increase in functional mobility to complete ADLs    Baseline 11/11/20:    Time 8    Period Weeks    Status New      PT LONG TERM GOAL #2   Title Pt will increase gait speed in 10MWT to >0.22m/s to demonstrate appropriate speed for limited community ambulation    Baseline 11/11/20: 0.70 m/s    Time 8    Period Weeks    Status New      PT LONG TERM GOAL #3   Title Pt will perform 5XSTS within 12 seconds without UE support in order to demonstrate improved LE strength and decrease the likelihood of falling    Baseline 11/11/20: 16 sec BUE support    Time 8    Period Weeks    Status New    Target Date 01/06/21      PT LONG TERM GOAL #4   Title Patient will demonstrate decreased risk for falls with function gait assessment by performing 15/30. (4+MCID)    Baseline 11/18/20: 11/30    Time 8    Period Weeks    Status New    Target Date 01/06/21                   Plan - 11/27/20 1532     Clinical Impression Statement PT continued LE strengthening, gait/stair training, and dynamic balance training in order to promote safe navigation at home and community. Pt continued educating patient on importance of step height and toe clearance for safe ambulation. Pt continues to demonstrate decreased dorsiflexion and foot clearance L>R through swing phase of gait. Continue PT POC as able.    Personal Factors and Comorbidities Age;Comorbidity 1    Comorbidities prostate cancer    Examination-Activity Limitations Bathing;Dressing;Stairs;Locomotion Level;Stand;Lift;Transfers    Examination-Participation Restrictions Community Activity;Yard Work;Cleaning;Meal Prep;Driving    Stability/Clinical Decision Making Evolving/Moderate complexity    Clinical Decision Making Moderate    Rehab Potential Fair    PT Frequency 2x / week    PT Duration 8 weeks    PT Treatment/Interventions  ADLs/Self Care Home Management;Cryotherapy;Traction;Gait training;Stair training;Functional mobility training;Therapeutic activities;Neuromuscular re-education;Balance training;Therapeutic exercise;Patient/family education;Manual techniques;Passive range of motion;Dry needling    PT Next Visit Plan Gait training, balance, strength training    PT Home Exercise Plan Sit to Stands, standing hip ABD, walking with RW, seated heel/toe taps    Consulted  and Agree with Plan of Care Patient             Patient will benefit from skilled therapeutic intervention in order to improve the following deficits and impairments:  Abnormal gait, Decreased balance, Decreased endurance, Decreased mobility, Difficulty walking, Pain, Postural dysfunction, Decreased strength, Decreased safety awareness, Decreased activity tolerance, Improper body mechanics, Decreased range of motion  Visit Diagnosis: Other abnormalities of gait and mobility     Problem List There are no problems to display for this patient.    Durwin Reges DPT Sharion Settler, SPT  Durwin Reges, PT 11/28/2020, 11:04 AM  [facility] [address] [place], [address] Phone: [phone]   Fax:  [phone]  Name: Jamie Brennan MRN: [id number] Date of Birth: 1931-08-19

## 2020-12-02 ENCOUNTER — Ambulatory Visit: Payer: Medicare Other | Admitting: Physical Therapy

## 2020-12-02 ENCOUNTER — Encounter: Payer: Self-pay | Admitting: Physical Therapy

## 2020-12-02 DIAGNOSIS — R2689 Other abnormalities of gait and mobility: Secondary | ICD-10-CM

## 2020-12-02 NOTE — Therapy (Signed)
[facility] [address], [place], [address] Phone: [phone]   Fax:  [phone]  Physical Therapy Treatment  Patient Details  Name: Jamie Brennan MRN: [id number] Date of Birth: [birth year] Referring Provider (PT): Tama High III   Encounter Date: 12/02/2020   PT End of Session - 12/02/20 1313     Visit Number 6    Number of Visits 17    Date for PT Re-Evaluation 01/06/21    Authorization Type BCBS Medicare    Authorization Time Period 11/11/20-01/06/21    Authorization - Visit Number 6    Progress Note Due on Visit 10    PT Start Time 1302    PT Stop Time 1340    PT Time Calculation (min) 38 min    Equipment Utilized During Treatment Gait belt    Activity Tolerance Patient tolerated treatment well    Behavior During Therapy WFL for tasks assessed/performed             Past Medical History:  Diagnosis Date   Prostate cancer [facility])     Past Surgical History:  Procedure Laterality Date   BACK SURGERY     PROSTATECTOMY      There were no vitals filed for this visit.   Subjective Assessment - 12/02/20 1305     Subjective Pt reports he is feeling well overall. Reports compliance with HEP and no falls since last visit. Pt reports he has been trying to pick his feet up more when he walks.    Pertinent History Jamie Brennan is a 87 year old male presenting with conerns of LE weakness and balance following a fall on 09/06/20 and Covid-19/rhabdomyolysis diagnosis. Pt reports falling outside while trying to pick up a "piece of bread." Pt reports not having a history of falling other than one fall approximately 1 year prior attibuted to pneumonia. He presents today amb with a RW that his niece reports he does not use frequently. [name]. He lives in a multilevel home with 10 steps to enter with railing on L side. Patient  reports he was independent with all ADLs prior to falling. He currently reports independence in bathing, dressing, cooking, cleaning. He is not currently driving. He is currently living with his niece who is his primary caregiver. He reports has a PMH of prostate cancer, prostectomy, previous lumbar surgery, and difficulty hearing. Pt denies any unexplained weight fluctuation, saddle paresthesia, loss of bowel/bladder function, or unrelenting night pain at this time.    Limitations Walking;Lifting;House hold activities    How long can you sit comfortably? hours    How long can you stand comfortably? 30-72min    How long can you walk comfortably? 30-45                Therex    Recumbent Bike 2 x 5 min seat Level 11 for LE strengthening    Alt heel taps to 6in step with unilateral handrail support 2x 12 with min cuing for "light" touch and control with lower with good carry over  Alt step over 6in hurdle to heel contact and return 2x 12 with min cuing for heel contact Carry over of the above exercise in walking with SPC 287ft with CGA for safety, cuing for heel strike with "large step" with good carry over, more difficulty with compliance of LLE  Squat to slightly elevated mat table  2x 8 with cuing for full hip ext in standing with good carry over                            PT Education - 12/02/20 1312     Education Details therex form/technique    Person(s) Educated Patient    Methods Explanation;Demonstration;Verbal cues    Comprehension Verbalized understanding;Returned demonstration;Verbal cues required              PT Short Term Goals - 11/11/20 1715       PT SHORT TERM GOAL #1   Title Pt will demonstrate independence with HEP to improve LE strength and balance for increased ability to participate with ADLs    Baseline HEP given    Time 4    Period Weeks    Status New    Target Date 12/09/20               PT Long Term Goals - 11/18/20  1709       PT LONG TERM GOAL #1   Title Patient will increase FOTO score to ** to indicate predicted increase in functional mobility to complete ADLs    Baseline 11/11/20:    Time 8    Period Weeks    Status New      PT LONG TERM GOAL #2   Title Pt will increase gait speed in 10MWT to >0.72m/s to demonstrate appropriate speed for limited community ambulation    Baseline 11/11/20: 0.70 m/s    Time 8    Period Weeks    Status New      PT LONG TERM GOAL #3   Title Pt will perform 5XSTS within 12 seconds without UE support in order to demonstrate improved LE strength and decrease the likelihood of falling    Baseline 11/11/20: 16 sec BUE support    Time 8    Period Weeks    Status New    Target Date 01/06/21      PT LONG TERM GOAL #4   Title Patient will demonstrate decreased risk for falls with function gait assessment by performing 15/30. (4+MCID)    Baseline 11/18/20: 11/30    Time 8    Period Weeks    Status New    Target Date 01/06/21                   Plan - 12/02/20 1337     Clinical Impression Statement PT continued therex progression for general BLE strengthening, and balance with contralateral heel clearance with carry over into functional gait with success. Patient is motivated throughout session and is able to complete all therex with proper technique following multimodal cuing. Patien tis able to carry over concepts of therx with increased foot clearance and heel strike, some continued cuing needed for LLE carry over. PT will continue progression as able.    Personal Factors and Comorbidities Age;Comorbidity 1    Comorbidities prostate cancer    Examination-Activity Limitations Bathing;Dressing;Stairs;Locomotion Level;Stand;Lift;Transfers    Examination-Participation Restrictions Community Activity;Yard Work;Cleaning;Meal Prep;Driving    Stability/Clinical Decision Making Evolving/Moderate complexity    Clinical Decision Making Moderate    Rehab Potential Fair     PT Frequency 2x / week    PT Duration 8 weeks    PT Treatment/Interventions ADLs/Self Care Home Management;Cryotherapy;Traction;Gait training;Stair training;Functional mobility training;Therapeutic activities;Neuromuscular re-education;Balance training;Therapeutic exercise;Patient/family education;Manual techniques;Passive range of motion;Dry needling    PT Next Visit Plan Gait training,  balance, strength training    PT Home Exercise Plan Sit to Stands, standing hip ABD, walking with RW, seated heel/toe taps    Consulted and Agree with Plan of Care Patient             Patient will benefit from skilled therapeutic intervention in order to improve the following deficits and impairments:  Abnormal gait, Decreased balance, Decreased endurance, Decreased mobility, Difficulty walking, Pain, Postural dysfunction, Decreased strength, Decreased safety awareness, Decreased activity tolerance, Improper body mechanics, Decreased range of motion  Visit Diagnosis: Other abnormalities of gait and mobility     Problem List There are no problems to display for this patient.  Durwin Reges DPT Durwin Reges, PT 12/02/2020, 1:43 PM  [facility] [address], [place], [address] Phone: [phone]   Fax:  [phone]  Name: Jamie Brennan MRN: [id number] Date of Birth: 07-06-1931

## 2020-12-09 ENCOUNTER — Ambulatory Visit: Payer: Medicare Other | Admitting: Physical Therapy

## 2020-12-09 ENCOUNTER — Other Ambulatory Visit: Payer: Self-pay

## 2020-12-09 ENCOUNTER — Encounter: Payer: Self-pay | Admitting: Physical Therapy

## 2020-12-09 DIAGNOSIS — R2689 Other abnormalities of gait and mobility: Secondary | ICD-10-CM

## 2020-12-09 NOTE — Therapy (Signed)
[facility] [address] [place], [address] Phone: [phone]   Fax:  [phone]  Physical Therapy Treatment  Patient Details  Name: Jamie Brennan MRN: [id number] Date of Birth: 04-14-1931 Referring Provider (PT): Tama High III   Encounter Date: 12/09/2020   PT End of Session - 12/09/20 1356     Visit Number 7    Number of Visits 17    Date for PT Re-Evaluation 01/06/21    Authorization Type BCBS Medicare    Authorization Time Period 11/11/20-01/06/21    Authorization - Visit Number 7    Progress Note Due on Visit 10    PT Start Time 1347    PT Stop Time 1425    PT Time Calculation (min) 38 min    Equipment Utilized During Treatment Gait belt    Activity Tolerance Patient tolerated treatment well    Behavior During Therapy WFL for tasks assessed/performed             Past Medical History:  Diagnosis Date   Prostate cancer [facility])     Past Surgical History:  Procedure Laterality Date   BACK SURGERY     PROSTATECTOMY      There were no vitals filed for this visit.   Subjective Assessment - 12/09/20 1353     Subjective Patient reports he is doing well overall, completing HEP every am. He walks into lcinic without RW, reports he wants to get away from using his, as it is bulky. No falls or other changes in medical history to report.    Pertinent History Jamie Brennan is a 87 year old male presenting with conerns of LE weakness and balance following a fall on 09/06/20 and Covid-19/rhabdomyolysis diagnosis. Pt reports falling outside while trying to pick up a "piece of bread." Pt reports not having a history of falling other than one fall approximately 1 year prior attibuted to pneumonia. He presents today amb with a RW that his niece reports he does not use frequently. [name]. He lives in a multilevel home with 10  steps to enter with railing on L side. Patient reports he was independent with all ADLs prior to falling. He currently reports independence in bathing, dressing, cooking, cleaning. He is not currently driving. He is currently living with his niece who is his primary caregiver. He reports has a PMH of prostate cancer, prostectomy, previous lumbar surgery, and difficulty hearing. Pt denies any unexplained weight fluctuation, saddle paresthesia, loss of bowel/bladder function, or unrelenting night pain at this time.    Limitations Walking;Lifting;House hold activities    How long can you sit comfortably? hours    How long can you stand comfortably? 30-73min    How long can you walk comfortably? 30-45    Patient Stated Goals Living independent               Therex    Recumbent Bike 2 x 5 min seat Level 11 for LE strengthening    Alt heel taps to 6in step with unilateral 2 finger support x12; on foam x12 with min cuing for increased hip flexion height  STS 2x 10 with cuing for light chair tap, pt unable to control descent to the point of not touching chair (decreased control after 100d); max cuing needed for full hip ext with upright position  Omega leg press 35# x10; 45# x10 (initially attempted  55# unable)  Carry over of the above exercise in walking with SPC 273ft with CGA for safety, cuing for heel strike with "large step" with good carry over, more difficulty with compliance of LLE  tandem walking 64ft with SPC CGA for safety; w/o AD CGA for safety, multiple foot down/stepping strategies needed to maintain balance                        PT Education - 12/09/20 1354     Education Details therex form/technique    Person(s) Educated Patient    Methods Explanation;Demonstration;Verbal cues    Comprehension Verbalized understanding;Verbal cues required;Returned demonstration              PT Short Term Goals - 11/11/20 1715       PT SHORT TERM GOAL #1    Title Pt will demonstrate independence with HEP to improve LE strength and balance for increased ability to participate with ADLs    Baseline HEP given    Time 4    Period Weeks    Status New    Target Date 12/09/20               PT Long Term Goals - 11/18/20 1709       PT LONG TERM GOAL #1   Title Patient will increase FOTO score to ** to indicate predicted increase in functional mobility to complete ADLs    Baseline 11/11/20:    Time 8    Period Weeks    Status New      PT LONG TERM GOAL #2   Title Pt will increase gait speed in 10MWT to >0.41m/s to demonstrate appropriate speed for limited community ambulation    Baseline 11/11/20: 0.70 m/s    Time 8    Period Weeks    Status New      PT LONG TERM GOAL #3   Title Pt will perform 5XSTS within 12 seconds without UE support in order to demonstrate improved LE strength and decrease the likelihood of falling    Baseline 11/11/20: 16 sec BUE support    Time 8    Period Weeks    Status New    Target Date 01/06/21      PT LONG TERM GOAL #4   Title Patient will demonstrate decreased risk for falls with function gait assessment by performing 15/30. (4+MCID)    Baseline 11/18/20: 11/30    Time 8    Period Weeks    Status New    Target Date 01/06/21                   Plan - 12/09/20 1414     Clinical Impression Statement PT continued therex progression for general BLE strengthening and balance with continued emphasis on components of normalized gait (increased hip  flex, increased DF with heel strike, increased hip ext in stance phase) with success. Paitent is able to comply with cuing for proper technique of all therex with excellent carry over. Patient needs some redirection with foot clearance with gait, does much better with [facility] with complying than without AD- educated patient on this with understanding. PT will continue progression as able.    Personal Factors and Comorbidities Age;Comorbidity 1    Comorbidities  prostate cancer    Examination-Activity Limitations Bathing;Dressing;Stairs;Locomotion Level;Stand;Lift;Transfers    Examination-Participation Restrictions Community Activity;Yard Work;Cleaning;Meal Prep;Driving    Stability/Clinical Decision Making Evolving/Moderate complexity    Clinical Decision Making Moderate  Rehab Potential Fair    PT Frequency 2x / week    PT Duration 8 weeks    PT Treatment/Interventions ADLs/Self Care Home Management;Cryotherapy;Traction;Gait training;Stair training;Functional mobility training;Therapeutic activities;Neuromuscular re-education;Balance training;Therapeutic exercise;Patient/family education;Manual techniques;Passive range of motion;Dry needling    PT Next Visit Plan Gait training, balance, strength training    PT Home Exercise Plan Sit to Stands, standing hip ABD, walking with RW, seated heel/toe taps    Consulted and Agree with Plan of Care Patient             Patient will benefit from skilled therapeutic intervention in order to improve the following deficits and impairments:  Abnormal gait, Decreased balance, Decreased endurance, Decreased mobility, Difficulty walking, Pain, Postural dysfunction, Decreased strength, Decreased safety awareness, Decreased activity tolerance, Improper body mechanics, Decreased range of motion  Visit Diagnosis: Other abnormalities of gait and mobility     Problem List There are no problems to display for this patient.  Durwin Reges DPT Durwin Reges, PT 12/09/2020, 2:27 PM  [facility] [address] [place], [address] Phone: [phone]   Fax:  [phone]  Name: Jamie Brennan MRN: [id number] Date of Birth: 1931-09-24

## 2020-12-11 ENCOUNTER — Ambulatory Visit: Payer: Medicare Other | Attending: Internal Medicine | Admitting: Physical Therapy

## 2020-12-11 ENCOUNTER — Encounter: Payer: Self-pay | Admitting: Physical Therapy

## 2020-12-11 DIAGNOSIS — R2689 Other abnormalities of gait and mobility: Secondary | ICD-10-CM | POA: Diagnosis not present

## 2020-12-11 NOTE — Therapy (Signed)
[facility] [address] [place], [address] Phone: [phone]   Fax:  [phone]  Physical Therapy Treatment  Patient Details  Name: Jamie Brennan MRN: [id number] Date of Birth: [birth year] Referring Provider (PT): Tama High III   Encounter Date: 12/11/2020   PT End of Session - 12/11/20 1434     Visit Number 8    Number of Visits 17    Date for PT Re-Evaluation 01/06/21    Authorization Type BCBS Medicare    Authorization Time Period 11/11/20-01/06/21    Authorization - Visit Number 8    Progress Note Due on Visit 10    PT Start Time 1430    PT Stop Time 1510    PT Time Calculation (min) 40 min    Equipment Utilized During Treatment Gait belt    Activity Tolerance Patient tolerated treatment well    Behavior During Therapy WFL for tasks assessed/performed             Past Medical History:  Diagnosis Date   Prostate cancer [facility])     Past Surgical History:  Procedure Laterality Date   BACK SURGERY     PROSTATECTOMY      There were no vitals filed for this visit.   Subjective Assessment - 12/11/20 1432     Subjective Pt reports he is doing well overall, completing HEP. Ambulates into clinic without AD, reports he does use it functionally at home and in the community. Feels like next week maybe he could graduate from therapy.    Pertinent History Jamie Brennan is a 87 year old male presenting with conerns of LE weakness and balance following a fall on 09/06/20 and Covid-19/rhabdomyolysis diagnosis. Pt reports falling outside while trying to pick up a "piece of bread." Pt reports not having a history of falling other than one fall approximately 1 year prior attibuted to pneumonia. He presents today amb with a RW that his niece reports he does not use frequently. [name]. He lives in a multilevel home with 10 steps to  enter with railing on L side. Patient reports he was independent with all ADLs prior to falling. He currently reports independence in bathing, dressing, cooking, cleaning. He is not currently driving. He is currently living with his niece who is his primary caregiver. He reports has a PMH of prostate cancer, prostectomy, previous lumbar surgery, and difficulty hearing. Pt denies any unexplained weight fluctuation, saddle paresthesia, loss of bowel/bladder function, or unrelenting night pain at this time.    Limitations Walking;Lifting;House hold activities    How long can you sit comfortably? hours    How long can you stand comfortably? 30-77min    How long can you walk comfortably? 30-45    Patient Stated Goals Living independent                Therex    Recumbent Bike L3 x 5 min seat Level 11 for LE strengthening    Dynamic balance with gait 214ft with horizontal and vertical head changes; 28ft with speed changes called out at random; all 479ft with CGA and without AD  Dynamic balance with gait 166ft with 7 6in hurdles negotation with SPC- cuing needed for sequencing with good carry over; and subsequent 131ft without AD; CGA for both min speed decrease without AD, good maintenance of balance with continued ambulation   STS 2x 10  with cuing for full stand with full bilat hip and knee ext with good carry over, min cuing for eccentric control with decent carry over  Omega leg press 45# x10; 55# x10 with min cuing for eccentric control with good carry over   SLS with 2 finger support LLE 4sec RLE 14sec R tandem with CGA for safety 12sec; L tandem with CGA for safety 5sec (multiple attempts                            PT Education - 12/11/20 1434     Education Details therex form/technique    Person(s) Educated Patient    Methods Explanation;Demonstration;Verbal cues    Comprehension Verbalized understanding;Returned demonstration;Verbal cues required               PT Short Term Goals - 11/11/20 1715       PT SHORT TERM GOAL #1   Title Pt will demonstrate independence with HEP to improve LE strength and balance for increased ability to participate with ADLs    Baseline HEP given    Time 4    Period Weeks    Status New    Target Date 12/09/20               PT Long Term Goals - 11/18/20 1709       PT LONG TERM GOAL #1   Title Patient will increase FOTO score to ** to indicate predicted increase in functional mobility to complete ADLs    Baseline 11/11/20:    Time 8    Period Weeks    Status New      PT LONG TERM GOAL #2   Title Pt will increase gait speed in 10MWT to >0.15m/s to demonstrate appropriate speed for limited community ambulation    Baseline 11/11/20: 0.70 m/s    Time 8    Period Weeks    Status New      PT LONG TERM GOAL #3   Title Pt will perform 5XSTS within 12 seconds without UE support in order to demonstrate improved LE strength and decrease the likelihood of falling    Baseline 11/11/20: 16 sec BUE support    Time 8    Period Weeks    Status New    Target Date 01/06/21      PT LONG TERM GOAL #4   Title Patient will demonstrate decreased risk for falls with function gait assessment by performing 15/30. (4+MCID)    Baseline 11/18/20: 11/30    Time 8    Period Weeks    Status New    Target Date 01/06/21                   Plan - 12/11/20 1449     Clinical Impression Statement PT continued therex progression for functional strengthening and dynamic balance with gait with success. Pt is able to comply with all cuing for proper technique of therex and dynamic balance with good motivation throughout session. Patient requires CGA for all dynamic balance for safety, but demonstrates excellent safety awareness this session. PT will continue progression as able.    Personal Factors and Comorbidities Age;Comorbidity 1    Comorbidities prostate cancer    Examination-Activity Limitations  Bathing;Dressing;Stairs;Locomotion Level;Stand;Lift;Transfers    Examination-Participation Restrictions Community Activity;Yard Work;Cleaning;Meal Prep;Driving    Stability/Clinical Decision Making Evolving/Moderate complexity    Clinical Decision Making Moderate    Rehab Potential Fair    PT  Frequency 2x / week    PT Duration 8 weeks    PT Treatment/Interventions ADLs/Self Care Home Management;Cryotherapy;Traction;Gait training;Stair training;Functional mobility training;Therapeutic activities;Neuromuscular re-education;Balance training;Therapeutic exercise;Patient/family education;Manual techniques;Passive range of motion;Dry needling    PT Next Visit Plan Gait training, balance, strength training    PT Home Exercise Plan Sit to Stands, standing hip ABD, walking with RW, seated heel/toe taps    Consulted and Agree with Plan of Care Patient             Patient will benefit from skilled therapeutic intervention in order to improve the following deficits and impairments:  Abnormal gait, Decreased balance, Decreased endurance, Decreased mobility, Difficulty walking, Pain, Postural dysfunction, Decreased strength, Decreased safety awareness, Decreased activity tolerance, Improper body mechanics, Decreased range of motion  Visit Diagnosis: Other abnormalities of gait and mobility     Problem List There are no problems to display for this patient.  Durwin Reges DPT Durwin Reges, PT 12/11/2020, 3:13 PM  [facility] [address] [place], [address] Phone: [phone]   Fax:  [phone]  Name: Jamie Brennan MRN: [id number] Date of Birth: 22-Aug-1931

## 2020-12-16 ENCOUNTER — Ambulatory Visit: Payer: Medicare Other | Admitting: Physical Therapy

## 2020-12-16 ENCOUNTER — Encounter: Payer: Self-pay | Admitting: Physical Therapy

## 2020-12-16 DIAGNOSIS — R2689 Other abnormalities of gait and mobility: Secondary | ICD-10-CM

## 2020-12-16 NOTE — Therapy (Signed)
[facility] [address], [place], [address] Phone: [phone]   Fax:  [phone]  Physical Therapy Treatment/DC Summary  Reporting Period 11/11/20 - 12/16/20  Patient Details  Name: Jamie Brennan MRN: [id number] Date of Birth: 02-04-1931 Referring Provider (PT): Tama High III   Encounter Date: 12/16/2020   PT End of Session - 12/16/20 1440     Visit Number 9    Number of Visits 17    Date for PT Re-Evaluation 01/06/21    Authorization Type BCBS Medicare    Authorization Time Period 11/11/20-01/06/21    Authorization - Visit Number 9    Progress Note Due on Visit 10    PT Start Time 5102    PT Stop Time 1429    PT Time Calculation (min) 44 min    Activity Tolerance Patient tolerated treatment well    Behavior During Therapy [facility] for tasks assessed/performed             Past Medical History:  Diagnosis Date   Prostate cancer [facility])     Past Surgical History:  Procedure Laterality Date   BACK SURGERY     PROSTATECTOMY      There were no vitals filed for this visit.   Subjective Assessment - 12/16/20 1432     Subjective Patient reports no adverse events since last visit, completing HEP. Patient has no more appts scheduled, plans to return to [place] soon, believes he is prepared to d/c to HEP for progress.    Pertinent History Jamie Brennan is a 87 year old male presenting with conerns of LE weakness and balance following a fall on 09/06/20 and Covid-19/rhabdomyolysis diagnosis. Pt reports falling outside while trying to pick up a "piece of bread." Pt reports not having a history of falling other than one fall approximately 1 year prior attibuted to pneumonia. He presents today amb with a RW that his niece reports he does not use frequently. [name]. He lives in a multilevel home with 10 steps to enter with railing on L side.  Patient reports he was independent with all ADLs prior to falling. He currently reports independence in bathing, dressing, cooking, cleaning. He is not currently driving. He is currently living with his niece who is his primary caregiver. He reports has a PMH of prostate cancer, prostectomy, previous lumbar surgery, and difficulty hearing. Pt denies any unexplained weight fluctuation, saddle paresthesia, loss of bowel/bladder function, or unrelenting night pain at this time.    Limitations Walking;Lifting;House hold activities    How long can you sit comfortably? hours    How long can you stand comfortably? 30-74mn    How long can you walk comfortably? 30-45    Patient Stated Goals Living independent             Therex    Recumbent Bike L3 x 5 min seat Level 11 for LE strengthening    FGA with patient requiring at least supervision for all parts, minA to prevent running into objects with eyes closed, and CGA for tandem walking  5xSTS x2 trials   PT reviewed the following HEP with patient with patient able to demonstrate a set of the following with min cuing for correction needed. PT educated patient on parameters of therex (how/when to inc/decrease intensity, frequency, rep/set range, stretch hold time, and purpose of therex) with verbalized understanding.  Sit to Stand  Without Arm Support - 2-3 x weekly - 3 sets - 10 reps Step Up - 1 x daily - 2-3 x weekly - 3 sets - 10 reps Side Stepping with Counter Support - 1 x daily - 2-3 x weekly - 3 sets - 10 reps Half Tandem Stance Balance with Eyes Closed - 2-3 x weekly - 10-15 hold                           PT Education - 12/16/20 1439     Education Details therex form/technique, d/c HEP    Person(s) Educated Patient    Methods Explanation;Demonstration;Verbal cues;Handout    Comprehension Verbalized understanding;Returned demonstration;Verbal cues required              PT Short Term Goals - 11/11/20 1715        PT SHORT TERM GOAL #1   Title Pt will demonstrate independence with HEP to improve LE strength and balance for increased ability to participate with ADLs    Baseline HEP given    Time 4    Period Weeks    Status New    Target Date 12/09/20               PT Long Term Goals - 12/16/20 1358       PT LONG TERM GOAL #1   Title Patient will increase FOTO score to 66 to indicate predicted increase in functional mobility to complete ADLs    Baseline 11/11/20: 51; 12/16/20 67    Time 8    Period Weeks    Status Achieved      PT LONG TERM GOAL #2   Title Pt will increase gait speed in 10MWT to >0.59ms to demonstrate appropriate speed for limited community ambulation    Baseline 11/11/20: 0.70 m/s; 12/16/20 without AD 0.829m; with SPC 1.52m67m   Time 8    Period Weeks    Status Achieved      PT LONG TERM GOAL #3   Title Pt will perform 5XSTS within 12 seconds without UE support in order to demonstrate improved LE strength and decrease the likelihood of falling    Baseline 11/11/20: 16 sec BUE support; 12/16/20 13sec without UE use    Time 8    Period Weeks    Status Partially Met      PT LONG TERM GOAL #4   Title Patient will demonstrate decreased risk for falls with function gait assessment by performing 15/30. (4+MCID)    Baseline 11/18/20: 11/30; 12/16/20 15/30    Time 8    Period Weeks    Status Achieved    Target Date 01/06/21                   Plan - 12/16/20 1447     Clinical Impression Statement PT reassessed goals this session where patient has met most goals, demonstrig increased gait speed for limited community ambulation, decreased fall risk, and increased gross strength. Pt still in fall risk category according to FGA, though he has increased his score by the MCID. Patient understands his risk to fall and verbalizes understanding of reducing fall risk with continuing ambulation with increased foot clearance, use of SPC whe out of house, and keeping his  life alert on at all times. Paitent demonstrates and verbalizes understanding of HEP for maintaining and increasing strength, and decreasing fall risk. handout given. Pt to d/c PT    Personal Factors and  Comorbidities Age;Comorbidity 1    Comorbidities prostate cancer    Examination-Activity Limitations Bathing;Dressing;Stairs;Locomotion Level;Stand;Lift;Transfers    Examination-Participation Restrictions Community Activity;Yard Work;Cleaning;Meal Prep;Driving    Stability/Clinical Decision Making Evolving/Moderate complexity    Clinical Decision Making Moderate    Rehab Potential Fair    PT Frequency 2x / week    PT Duration 8 weeks    PT Treatment/Interventions ADLs/Self Care Home Management;Cryotherapy;Traction;Gait training;Stair training;Functional mobility training;Therapeutic activities;Neuromuscular re-education;Balance training;Therapeutic exercise;Patient/family education;Manual techniques;Passive range of motion;Dry needling    PT Next Visit Plan Gait training, balance, strength training    PT Home Exercise Plan Sit to Stands, standing hip ABD, walking with RW, seated heel/toe taps    Consulted and Agree with Plan of Care Patient             Patient will benefit from skilled therapeutic intervention in order to improve the following deficits and impairments:  Abnormal gait, Decreased balance, Decreased endurance, Decreased mobility, Difficulty walking, Pain, Postural dysfunction, Decreased strength, Decreased safety awareness, Decreased activity tolerance, Improper body mechanics, Decreased range of motion  Visit Diagnosis: Other abnormalities of gait and mobility     Problem List There are no problems to display for this patient.  Thank you for this referral.  Durwin Reges DPT Durwin Reges, PT 12/16/2020, 3:06 PM  [facility] [address] [place], [address] Phone: [phone]   Fax:   [phone]  Name: Jamie Brennan MRN: [id number] Date of Birth: 15-May-1931

## 2021-02-20 ENCOUNTER — Emergency Department: Payer: Medicare Other

## 2021-02-20 ENCOUNTER — Inpatient Hospital Stay
Admission: EM | Admit: 2021-02-20 | Discharge: 2021-02-27 | DRG: 481 | Disposition: A | Discharge status: Skilled Nursing Facility | Payer: Medicare Other | Attending: Internal Medicine | Admitting: Internal Medicine

## 2021-02-20 ENCOUNTER — Encounter: Payer: Self-pay | Admitting: Family Medicine

## 2021-02-20 ENCOUNTER — Other Ambulatory Visit: Payer: Self-pay

## 2021-02-20 DIAGNOSIS — S72002A Fracture of unspecified part of neck of left femur, initial encounter for closed fracture: Secondary | ICD-10-CM | POA: Diagnosis present

## 2021-02-20 DIAGNOSIS — Y929 Unspecified place or not applicable: Secondary | ICD-10-CM | POA: Diagnosis not present

## 2021-02-20 DIAGNOSIS — Z8546 Personal history of malignant neoplasm of prostate: Secondary | ICD-10-CM | POA: Diagnosis not present

## 2021-02-20 DIAGNOSIS — Z20822 Contact with and (suspected) exposure to covid-19: Secondary | ICD-10-CM | POA: Diagnosis present

## 2021-02-20 DIAGNOSIS — Z801 Family history of malignant neoplasm of trachea, bronchus and lung: Secondary | ICD-10-CM | POA: Diagnosis not present

## 2021-02-20 DIAGNOSIS — S72009A Fracture of unspecified part of neck of unspecified femur, initial encounter for closed fracture: Secondary | ICD-10-CM

## 2021-02-20 DIAGNOSIS — Z66 Do not resuscitate: Secondary | ICD-10-CM | POA: Diagnosis present

## 2021-02-20 DIAGNOSIS — S72142A Displaced intertrochanteric fracture of left femur, initial encounter for closed fracture: Principal | ICD-10-CM | POA: Diagnosis present

## 2021-02-20 DIAGNOSIS — Z8249 Family history of ischemic heart disease and other diseases of the circulatory system: Secondary | ICD-10-CM | POA: Diagnosis not present

## 2021-02-20 DIAGNOSIS — E538 Deficiency of other specified B group vitamins: Secondary | ICD-10-CM | POA: Diagnosis present

## 2021-02-20 DIAGNOSIS — E1122 Type 2 diabetes mellitus with diabetic chronic kidney disease: Secondary | ICD-10-CM | POA: Diagnosis present

## 2021-02-20 DIAGNOSIS — Z806 Family history of leukemia: Secondary | ICD-10-CM

## 2021-02-20 DIAGNOSIS — D649 Anemia, unspecified: Secondary | ICD-10-CM | POA: Diagnosis not present

## 2021-02-20 DIAGNOSIS — W19XXXA Unspecified fall, initial encounter: Secondary | ICD-10-CM

## 2021-02-20 DIAGNOSIS — D62 Acute posthemorrhagic anemia: Secondary | ICD-10-CM | POA: Diagnosis not present

## 2021-02-20 DIAGNOSIS — N1831 Chronic kidney disease, stage 3a: Secondary | ICD-10-CM | POA: Diagnosis present

## 2021-02-20 DIAGNOSIS — W07XXXA Fall from chair, initial encounter: Secondary | ICD-10-CM | POA: Diagnosis present

## 2021-02-20 DIAGNOSIS — D638 Anemia in other chronic diseases classified elsewhere: Secondary | ICD-10-CM | POA: Diagnosis present

## 2021-02-20 DIAGNOSIS — Z23 Encounter for immunization: Secondary | ICD-10-CM | POA: Diagnosis present

## 2021-02-20 DIAGNOSIS — Z794 Long term (current) use of insulin: Secondary | ICD-10-CM | POA: Diagnosis not present

## 2021-02-20 DIAGNOSIS — M25552 Pain in left hip: Secondary | ICD-10-CM | POA: Diagnosis present

## 2021-02-20 DIAGNOSIS — S72002P Fracture of unspecified part of neck of left femur, subsequent encounter for closed fracture with malunion: Secondary | ICD-10-CM | POA: Diagnosis not present

## 2021-02-20 LAB — CBC WITH DIFFERENTIAL/PLATELET
Abs Immature Granulocytes: 0.03 10*3/uL (ref 0.00–0.07)
Basophils Absolute: 0 10*3/uL (ref 0.0–0.1)
Basophils Relative: 0 %
Eosinophils Absolute: 0 10*3/uL (ref 0.0–0.5)
Eosinophils Relative: 0 %
HCT: 37.2 % — ABNORMAL LOW (ref 39.0–52.0)
Hemoglobin: 12.2 g/dL — ABNORMAL LOW (ref 13.0–17.0)
Immature Granulocytes: 0 %
Lymphocytes Relative: 9 %
Lymphs Abs: 0.8 10*3/uL (ref 0.7–4.0)
MCH: 30.4 pg (ref 26.0–34.0)
MCHC: 32.8 g/dL (ref 30.0–36.0)
MCV: 92.8 fL (ref 80.0–100.0)
Monocytes Absolute: 0.6 10*3/uL (ref 0.1–1.0)
Monocytes Relative: 7 %
Neutro Abs: 7.2 10*3/uL (ref 1.7–7.7)
Neutrophils Relative %: 84 %
Platelets: 286 10*3/uL (ref 150–400)
RBC: 4.01 MIL/uL — ABNORMAL LOW (ref 4.22–5.81)
RDW: 12.2 % (ref 11.5–15.5)
WBC: 8.7 10*3/uL (ref 4.0–10.5)
nRBC: 0 % (ref 0.0–0.2)

## 2021-02-20 LAB — APTT: aPTT: 28 seconds (ref 24–36)

## 2021-02-20 LAB — TROPONIN I (HIGH SENSITIVITY): Troponin I (High Sensitivity): 8 ng/L (ref <18)

## 2021-02-20 LAB — COMPREHENSIVE METABOLIC PANEL
ALT: 30 U/L (ref 0–44)
AST: 28 U/L (ref 15–41)
Albumin: 4.3 g/dL (ref 3.5–5.0)
Alkaline Phosphatase: 97 U/L (ref 38–126)
Anion gap: 10 (ref 5–15)
BUN: 35 mg/dL — ABNORMAL HIGH (ref 8–23)
CO2: 24 mmol/L (ref 22–32)
Calcium: 9 mg/dL (ref 8.9–10.3)
Chloride: 105 mmol/L (ref 98–111)
Creatinine, Ser: 1.62 mg/dL — ABNORMAL HIGH (ref 0.61–1.24)
GFR, Estimated: 41 mL/min — ABNORMAL LOW (ref >60)
Glucose, Bld: 136 mg/dL — ABNORMAL HIGH (ref 70–99)
Potassium: 4.6 mmol/L (ref 3.5–5.1)
Sodium: 139 mmol/L (ref 135–145)
Total Bilirubin: 0.7 mg/dL (ref 0.3–1.2)
Total Protein: 8.2 g/dL — ABNORMAL HIGH (ref 6.5–8.1)

## 2021-02-20 LAB — RESP PANEL BY RT-PCR (FLU A&B, COVID) ARPGX2
Influenza A by PCR: NEGATIVE
Influenza B by PCR: NEGATIVE
SARS Coronavirus 2 by RT PCR: NEGATIVE

## 2021-02-20 LAB — PROTIME-INR
INR: 1 (ref 0.8–1.2)
Prothrombin Time: 13.5 seconds (ref 11.4–15.2)

## 2021-02-20 MED ORDER — CEFAZOLIN SODIUM-DEXTROSE 2-4 GM/100ML-% IV SOLN
2.0000 g | INTRAVENOUS | Status: DC
  Filled 2021-02-20: qty 100

## 2021-02-20 MED ORDER — MAGNESIUM HYDROXIDE 400 MG/5ML PO SUSP
30.0000 mL | Freq: Every day | ORAL | Status: DC | PRN
  Administered 2021-02-22: 30 mL via ORAL
  Filled 2021-02-20: qty 30

## 2021-02-20 MED ORDER — ACETAMINOPHEN 325 MG PO TABS
650.0000 mg | ORAL_TABLET | ORAL | Status: DC | PRN
  Administered 2021-02-20: 650 mg via ORAL
  Filled 2021-02-20: qty 2

## 2021-02-20 MED ORDER — ONDANSETRON HCL 4 MG/2ML IJ SOLN: 4.0000 mg | INTRAMUSCULAR | Status: DC | PRN

## 2021-02-20 MED ORDER — HYDROCODONE-ACETAMINOPHEN 5-325 MG PO TABS
1.0000 | ORAL_TABLET | Freq: Four times a day (QID) | ORAL | Status: DC | PRN
  Administered 2021-02-21 – 2021-02-22 (×2): 1 via ORAL
  Administered 2021-02-22 – 2021-02-23 (×2): 2 via ORAL
  Administered 2021-02-23 – 2021-02-27 (×8): 1 via ORAL
  Filled 2021-02-20 (×5): qty 1
  Filled 2021-02-20: qty 2
  Filled 2021-02-20 (×5): qty 1
  Filled 2021-02-20: qty 2

## 2021-02-20 MED ORDER — MORPHINE SULFATE (PF) 2 MG/ML IV SOLN: 0.5000 mg | INTRAVENOUS | Status: DC | PRN

## 2021-02-20 MED ORDER — MUPIROCIN 2 % EX OINT
1.0000 "application " | TOPICAL_OINTMENT | Freq: Two times a day (BID) | CUTANEOUS | Status: DC
  Filled 2021-02-20: qty 22

## 2021-02-20 MED ORDER — FENTANYL CITRATE PF 50 MCG/ML IJ SOSY: 50.0000 ug | PREFILLED_SYRINGE | Freq: Once | INTRAMUSCULAR | Status: DC | PRN

## 2021-02-20 MED ORDER — TRAZODONE HCL 50 MG PO TABS
25.0000 mg | ORAL_TABLET | Freq: Every evening | ORAL | Status: DC | PRN
  Administered 2021-02-20: 23:00:00 25 mg via ORAL
  Filled 2021-02-20: qty 1

## 2021-02-20 MED ORDER — PNEUMOCOCCAL VAC POLYVALENT 25 MCG/0.5ML IJ INJ
0.5000 mL | INJECTION | INTRAMUSCULAR | Status: AC
  Administered 2021-02-22: 0.5 mL via INTRAMUSCULAR
  Filled 2021-02-20: qty 0.5

## 2021-02-20 NOTE — H&P (Signed)
[place]   PATIENT NAME: Jamie Brennan    MR#:  [id number]  DATE OF BIRTH:  07/31/1931  DATE OF ADMISSION:  02/20/2021  PRIMARY CARE PHYSICIAN: System, Provider Not In   Patient is coming from: Home  REQUESTING/REFERRING PHYSICIAN: Kelton Pillar, MD  CHIEF COMPLAINT:   Chief Complaint  Patient presents with   Fall  Left hip pain.  HISTORY OF PRESENT ILLNESS:  Jamie Brennan is a 88 y.o. Caucasian male with medical history significant for prostate cancer, stage IIIa chronic kidney disease, duodenitis, who presented to the ER with acute onset of mechanical fall while getting out of his chair, twisting and losing balance per his report.  He fell on his left side with subsequent left hip pain and inability to ambulate.  He denies any presyncope or syncope, chest pain or palpitations.  Headache or dizziness or blurred vision, paresthesias or focal muscle weakness before or after his fall.  No dysuria, oliguria or hematuria or flank pain.  No cough or wheezing or dyspnea.  No chest pain or palpitations.  ED Course: When he came to the ER, BP was 160/108 with otherwise normal vital signs.  Labs revealed a BUN of 35 and creatinine 1.62 and CBC showed mild anemia with hemoglobin 12.2 hematocrit 37.2.  Involves antigens and COVID-19 PCR came back negative.  MRSA PCR was negative as Staph aureus was negative as well.    EKG as reviewed by me : EKG showed normal sinus rhythm rate of 81 with poor R wave progression and T wave inversion inferiorly Imaging: Head noncontrasted CT scan revealed prominence of the lateral ventricles that may be related to predominant atrophy although component of normal pressure/communicating hydrocephalus cannot be excluded.  No acute displaced fracture or traumatic listhesis of the cervical spine and multilevel moderate to severe degenerative changes of the spine with associated multilevel severe osseous neuroforaminal stenosis.  Pelvic CT scan revealed  intertrochanteric left hip fracture with no pelvic fracture.  The patient was given 50 mcg of IV fentanyl.  He will be admitted to a medical bed for further evaluation and management. PAST MEDICAL HISTORY:   Past Medical History:  Diagnosis Date   Prostate cancer ([place])   -Stage IIIa chronic kidney disease - Duodenitis - Urinary retention  PAST SURGICAL HISTORY:   Past Surgical History:  Procedure Laterality Date   BACK SURGERY     PROSTATECTOMY      SOCIAL HISTORY:   Social History   Tobacco Use   Smoking status: Never   Smokeless tobacco: Never  Substance Use Topics   Alcohol use: Not Currently    FAMILY HISTORY:   His sister died from lung cancer.  His mother and brother had MI.  He has a brother who had leukemia.  DRUG ALLERGIES:  No Known Allergies  REVIEW OF SYSTEMS:   ROS As per history of present illness. All pertinent systems were reviewed above. Constitutional, HEENT, cardiovascular, respiratory, GI, GU, musculoskeletal, neuro, psychiatric, endocrine, integumentary and hematologic systems were reviewed and are otherwise negative/unremarkable except for positive findings mentioned above in the HPI.   MEDICATIONS AT HOME:   Prior to Admission medications   Medication Sig Start Date End Date Taking? Authorizing Provider  mupirocin cream (BACTROBAN) 2 % Apply 1 application topically 2 (two) times daily. For 5 days Patient not taking: Reported on 02/20/2021 07/16/19   [name], NP      VITAL SIGNS:  Blood pressure 140/85, pulse 89, temperature 98  F (36.7 C), temperature source Oral, resp. rate 16, height 5\' 10"  (1.778 m), weight 81.6 kg, SpO2 99 %.  PHYSICAL EXAMINATION:  Physical Exam  GENERAL:  88 y.o.-year-old Caucasian male patient lying in the bed with no acute distress.  EYES: Pupils equal, round, reactive to light and accommodation. No scleral icterus. Extraocular muscles intact.  HEENT: Head atraumatic, normocephalic. Oropharynx and  nasopharynx clear.  NECK:  Supple, no jugular venous distention. No thyroid enlargement, no tenderness.  LUNGS: Normal breath sounds bilaterally, no wheezing, rales,rhonchi or crepitation. No use of accessory muscles of respiration.  CARDIOVASCULAR: Regular rate and rhythm, S1, S2 normal. No murmurs, rubs, or gallops.  ABDOMEN: Soft, nondistended, nontender. Bowel sounds present. No organomegaly or mass.  EXTREMITIES: No pedal edema, cyanosis, or clubbing.  NEUROLOGIC: Cranial nerves II through XII are intact. Muscle strength 5/5 in all extremities. Sensation intact. Gait not checked. Musculoskeletal: Left lateral hip tenderness. PSYCHIATRIC: The patient is alert and oriented x 3.  Normal affect and good eye contact. SKIN: No obvious rash, lesion, or ulcer.   LABORATORY PANEL:   CBC Recent Labs  Lab 02/20/21 1920  WBC 8.7  HGB 12.2*  HCT 37.2*  PLT 286   ------------------------------------------------------------------------------------------------------------------  Chemistries  Recent Labs  Lab 02/20/21 1920  NA 139  K 4.6  CL 105  CO2 24  GLUCOSE 136*  BUN 35*  CREATININE 1.62*  CALCIUM 9.0  AST 28  ALT 30  ALKPHOS 97  BILITOT 0.7   ------------------------------------------------------------------------------------------------------------------  Cardiac Enzymes No results for input(s): TROPONINI in the last 168 hours. ------------------------------------------------------------------------------------------------------------------  RADIOLOGY:  CT HEAD WO CONTRAST (5MM)  Result Date: 02/20/2021 CLINICAL DATA:  Neck trauma (Age >= 65y); Head trauma, minor (Age >= 65y) EXAM: CT HEAD WITHOUT CONTRAST CT CERVICAL SPINE WITHOUT CONTRAST TECHNIQUE: Multidetector CT imaging of the head and cervical spine was performed following the standard protocol without intravenous contrast. Multiplanar CT image reconstructions of the cervical spine were also generated. RADIATION  DOSE REDUCTION: This exam was performed according to the departmental dose-optimization program which includes automated exposure control, adjustment of the mA and/or kV according to patient size and/or use of iterative reconstruction technique. COMPARISON:  None. FINDINGS: CT HEAD FINDINGS BRAIN: BRAIN Prominence of the lateral ventricles may be related to central predominant atrophy, although a component of normal pressure/communicating hydrocephalus cannot be excluded. Patchy and confluent areas of decreased attenuation are noted throughout the deep and periventricular white matter of the cerebral hemispheres bilaterally, compatible with chronic microvascular ischemic disease. No evidence of large-territorial acute infarction. No parenchymal hemorrhage. No mass lesion. No extra-axial collection. No mass effect or midline shift. No hydrocephalus. Basilar cisterns are patent. Vascular: No hyperdense vessel. Skull: No acute fracture or focal lesion. Sinuses/Orbits: Paranasal sinuses and mastoid air cells are clear. Bilateral lens replacement. Otherwise the orbits are unremarkable. Other: None. CT CERVICAL SPINE FINDINGS Alignment: Normal. Skull base and vertebrae: Multilevel moderate to severe degenerative changes of the spine. Associated multilevel severe osseous neural foraminal stenosis. No severe osseous central canal stenosis. No acute fracture. No aggressive appearing focal osseous lesion or focal pathologic process. Soft tissues and spinal canal: No prevertebral fluid or swelling. No visible canal hematoma. Upper chest: Unremarkable. Other: None. IMPRESSION: 1. No acute intracranial abnormality. 2. Prominence of the lateral ventricles may be related to central predominant atrophy, although a component of normal pressure/communicating hydrocephalus cannot be excluded. 3. No acute displaced fracture or traumatic listhesis of the cervical spine. 4. Multilevel moderate to severe degenerative changes of  the spine.  Associated multilevel severe osseous neural foraminal stenosis. Electronically Signed   By: Iven Finn M.D.   On: 02/20/2021 20:23   CT Cervical Spine Wo Contrast  Result Date: 02/20/2021 CLINICAL DATA:  Neck trauma (Age >= 65y); Head trauma, minor (Age >= 65y) EXAM: CT HEAD WITHOUT CONTRAST CT CERVICAL SPINE WITHOUT CONTRAST TECHNIQUE: Multidetector CT imaging of the head and cervical spine was performed following the standard protocol without intravenous contrast. Multiplanar CT image reconstructions of the cervical spine were also generated. RADIATION DOSE REDUCTION: This exam was performed according to the departmental dose-optimization program which includes automated exposure control, adjustment of the mA and/or kV according to patient size and/or use of iterative reconstruction technique. COMPARISON:  None. FINDINGS: CT HEAD FINDINGS BRAIN: BRAIN Prominence of the lateral ventricles may be related to central predominant atrophy, although a component of normal pressure/communicating hydrocephalus cannot be excluded. Patchy and confluent areas of decreased attenuation are noted throughout the deep and periventricular white matter of the cerebral hemispheres bilaterally, compatible with chronic microvascular ischemic disease. No evidence of large-territorial acute infarction. No parenchymal hemorrhage. No mass lesion. No extra-axial collection. No mass effect or midline shift. No hydrocephalus. Basilar cisterns are patent. Vascular: No hyperdense vessel. Skull: No acute fracture or focal lesion. Sinuses/Orbits: Paranasal sinuses and mastoid air cells are clear. Bilateral lens replacement. Otherwise the orbits are unremarkable. Other: None. CT CERVICAL SPINE FINDINGS Alignment: Normal. Skull base and vertebrae: Multilevel moderate to severe degenerative changes of the spine. Associated multilevel severe osseous neural foraminal stenosis. No severe osseous central canal stenosis. No acute fracture. No  aggressive appearing focal osseous lesion or focal pathologic process. Soft tissues and spinal canal: No prevertebral fluid or swelling. No visible canal hematoma. Upper chest: Unremarkable. Other: None. IMPRESSION: 1. No acute intracranial abnormality. 2. Prominence of the lateral ventricles may be related to central predominant atrophy, although a component of normal pressure/communicating hydrocephalus cannot be excluded. 3. No acute displaced fracture or traumatic listhesis of the cervical spine. 4. Multilevel moderate to severe degenerative changes of the spine. Associated multilevel severe osseous neural foraminal stenosis. Electronically Signed   By: Iven Finn M.D.   On: 02/20/2021 20:23   CT PELVIS WO CONTRAST  Result Date: 02/20/2021 CLINICAL DATA:  Fall, left hip pain/fracture EXAM: CT PELVIS WITHOUT CONTRAST TECHNIQUE: Multidetector CT imaging of the pelvis was performed following the standard protocol without intravenous contrast. RADIATION DOSE REDUCTION: This exam was performed according to the departmental dose-optimization program which includes automated exposure control, adjustment of the mA and/or kV according to patient size and/or use of iterative reconstruction technique. COMPARISON:  Left hip radiographs dated 02/20/2021 FINDINGS: Urinary Tract:  Bladder is within normal limits. Bowel:  Visualized bowel is unremarkable. Vascular/Lymphatic: No evidence of aneurysm. Atherosclerotic calcifications. No suspicious pelvic lymphadenopathy. Reproductive:  Status post prostatectomy. Other:  No pelvic ascites. Musculoskeletal: Intertrochanteric left hip fracture, mildly comminuted, but without significant displacement. No associated large fluid collection/hematoma. No pelvic fracture is seen. Mild degenerative changes of the lower lumbar spine with fusion at L4-5. IMPRESSION: Intertrochanteric left hip fracture. No pelvic fracture is seen. Electronically Signed   By: Julian Hy M.D.    On: 02/20/2021 20:21   DG Chest Portable 1 View  Result Date: 02/20/2021 CLINICAL DATA:  Fall EXAM: PORTABLE CHEST 1 VIEW COMPARISON:  None. FINDINGS: Heart size is normal. Tortuosity of the aorta is noted. The lungs are clear. The vascularity is normal. No effusion. Skin fold present on the right. No acute  bone finding. IMPRESSION: No active disease. Electronically Signed   By: Nelson Chimes M.D.   On: 02/20/2021 19:15   DG Hip Unilat W or Wo Pelvis 2-3 Views Left  Result Date: 02/20/2021 CLINICAL DATA:  Fall. EXAM: DG HIP (WITH OR WITHOUT PELVIS) 2-3V LEFT COMPARISON:  None. FINDINGS: Mildly displaced and comminuted appearing intertrochanteric fracture of the left femoral neck. The bones are osteopenic. No dislocation. The soft tissues are unremarkable. Multiple surgical clips noted over the pelvis. IMPRESSION: Mildly displaced and comminuted intertrochanteric fracture of the left femoral neck. Electronically Signed   By: Anner Crete M.D.   On: 02/20/2021 19:14      IMPRESSION AND PLAN:  Principal Problem:   Closed left hip fracture (HCC)  1.  Closed left hip fracture secondary to mechanical fall. - The patient will be admitted to a medical-surgical bed. - Pain management will be provided. - Orthopedic consultation will be obtained. - I notified Dr. Sharlet Salina about the patient. -The patient has no history of coronary artery disease, CHF, CVA work.  Diabetes mellitus on insulin.  He does have a history of stage IIIa chronic kidney disease however with creatinine less than 2.  Is therefore at average risk for his age for perioperative cardiovascular events.  He has no current pulmonary issues.  2.  Stage IIIa chronic kidney disease. - He will be hydrated with IV normal saline and renal functions will be followed.     DVT prophylaxis: CDs.  Medical prophylaxis is postponed till postoperative. Advanced Care Planning:  Code Status: Discussed with the patient and he desires to be DNR/DNI.    Family Communication:  The plan of care was discussed in details with the patient (and family). I answered all questions. The patient agreed to proceed with the above mentioned plan. Further management will depend upon hospital course. Disposition Plan: Back to previous home environment Consults called: Para consult All the records are reviewed and case discussed with ED provider.  Status is: Inpatient   At the time of the admission, it appears that the appropriate admission status for this patient is inpatient.  This is judged to be reasonable and necessary in order to provide the required intensity of service to ensure the patient's safety given the presenting symptoms, physical exam findings and initial radiographic and laboratory data in the context of comorbid conditions.  The patient requires inpatient status due to high intensity of service, high risk of further deterioration and high frequency of surveillance required.  I certify that at the time of admission, it is my clinical judgment that the patient will require inpatient hospital care extending more than 2 midnights.                            Dispo: The patient is from: Home              Anticipated d/c is to: Home              Patient currently is not medically stable to d/c.              Difficult to place patient: No  Christel Mormon M.D on 02/20/2021 at 10:29 PM  [facility]   From 7 PM-7 AM, contact night-coverage [email or web address]  CC: Primary care physician; System, Provider Not In

## 2021-02-20 NOTE — ED Provider Notes (Signed)
[facility] Provider Note    Event Date/Time   First MD Initiated Contact with Patient 02/20/21 1828     (approximate)   History   Fall   HPI  Jamie Brennan is a 88 y.o. male patient reports that he had a mechanical fall in which she fell directly onto his left hip.  He is adamant that he did not his head did not lose consciousness.  Denies any neck pain or difficulties with moving his neck.  Only reports pain in his left hip when he tries to move it.  Denies any chest wall pain, abdominal pain, back pain or other concerns.  Patient reports that he is visiting his niece.  He denies any is on any medication   Physical Exam   Triage Vital Signs: ED Triage Vitals  Enc Vitals Group     BP 02/20/21 1829 (!) 160/108     Pulse Rate 02/20/21 1829 89     Resp 02/20/21 1829 16     Temp 02/20/21 1829 98 F (36.7 C)     Temp Source 02/20/21 1829 Oral     SpO2 02/20/21 1829 99 %     Weight 02/20/21 1826 180 lb (81.6 kg)     Height 02/20/21 1826 5\' 10"  (1.778 m)     Head Circumference --      Peak Flow --      Pain Score 02/20/21 1824 5     Pain Loc --      Pain Edu? --      Excl. in [place]? --     Most recent vital signs: Vitals:   02/20/21 1829  BP: (!) 160/108  Pulse: 89  Resp: 16  Temp: 98 F (36.7 C)  SpO2: 99%     General: Awake, no distress.  No hematoma or evidence of head trauma CV:  Good peripheral perfusion.  Resp:  Normal effort.  Abd:  No distention.  Patient has no C-spine tenderness.  Full range of motion of neck.  No sensation changes in the arms or legs.  He has no chest wall tenderness, no abdominal tenderness.  He has difficulty lifting up the left leg off the bed and has pain on the left hip.  He is still able to wiggle his toes sensations intact he is got 2+ distal pulse.  Otherwise he has full range of motion of his other joints.   ED Results / Procedures / Treatments   Labs (all labs ordered are listed, but only  abnormal results are displayed) Labs Reviewed  RESP PANEL BY RT-PCR (FLU A&B, COVID) [id number]  CBC WITH DIFFERENTIAL/PLATELET  COMPREHENSIVE METABOLIC PANEL  PROTIME-INR  APTT     EKG  My interpretation of EKG:  Normal sinus rate of 81 arm with some T wave inversions in the inferior leads, no ST elevations  RADIOLOGY I have reviewed the xray personally and agree with radiology read patient has a mildly displaced and comminuted intertrochanter fracture on the left femoral neck  Chest x-ray negative   PROCEDURES:  Critical Care performed: No  Procedures   MEDICATIONS ORDERED IN ED: Medications  fentaNYL (SUBLIMAZE) injection 50 mcg (has no administration in time range)     IMPRESSION / MDM / ASSESSMENT AND PLAN / ED COURSE  I reviewed the triage vital signs and the nursing notes.  Differential diagnosis includes, but is not limited to, hip fracture, dislocation.  Given my high concern we will get preop labs including coags, COVID and preop chest x-ray.  At this time patient is neurovascularly intact.  X-ray firms hip fracture.  Will discuss with the orthopedic doctor.  Patient's niece is now at bedside updated them as well.  She reports that she came to him a few seconds after the fall that he had not blacked out was acting his normal self.  She did not think he had hit his head either.  He continues to denies any headaches or concerns for hitting his head.  Discussed with Dr. Sharlet Salina who recommended CT pelvis.  Patient does not think he hit his head but given patient's age and after discussion with the niece will make sure with a CT head CT cervical due to concern for distracting injury   CBC normal white count slightly low hemoglobin COVID, flu negative CMP slightly elevated creatinine but no priors to compare to Coags are normal  CT head shows some prominence of the lateral ventricles possibly from atrophy versus hydrocephalus.  Patient  denies confusion, urinary incontinence so I suspect more likely atrophy.  Discussed with the hospital team and will admit patient for hip fracture       FINAL CLINICAL IMPRESSION(S) / ED DIAGNOSES   Final diagnoses:  Fall, initial encounter  Closed fracture of neck of left femur, initial encounter ([name])     Rx / DC Orders   ED Discharge Orders     None        Note:  This document was prepared using Dragon voice recognition software and may include unintentional dictation errors.   Vanessa [place], MD 02/20/21 2103

## 2021-02-20 NOTE — ED Triage Notes (Signed)
BIB from EMS from home. Pt had mechanical fall with no LOC. Pt in pain posterior left hip.  Pt CAOx4 and in no acute distress.

## 2021-02-20 NOTE — ED Notes (Signed)
Jenne Pane - Niece - [phone] Pt is staying with niece while he visits from [place].

## 2021-02-21 ENCOUNTER — Encounter
Admission: EM | Disposition: A | Discharge status: Skilled Nursing Facility | Payer: Self-pay | Source: Home / Self Care | Attending: Internal Medicine

## 2021-02-21 ENCOUNTER — Encounter: Payer: Self-pay | Admitting: Family Medicine

## 2021-02-21 ENCOUNTER — Inpatient Hospital Stay: Payer: Medicare Other | Admitting: Certified Registered"

## 2021-02-21 ENCOUNTER — Inpatient Hospital Stay: Payer: Medicare Other

## 2021-02-21 ENCOUNTER — Other Ambulatory Visit: Payer: Self-pay

## 2021-02-21 DIAGNOSIS — D649 Anemia, unspecified: Secondary | ICD-10-CM

## 2021-02-21 HISTORY — PX: INTRAMEDULLARY (IM) NAIL INTERTROCHANTERIC: SHX5875

## 2021-02-21 LAB — BASIC METABOLIC PANEL
Anion gap: 11 (ref 5–15)
BUN: 35 mg/dL — ABNORMAL HIGH (ref 8–23)
CO2: 21 mmol/L — ABNORMAL LOW (ref 22–32)
Calcium: 8.5 mg/dL — ABNORMAL LOW (ref 8.9–10.3)
Chloride: 106 mmol/L (ref 98–111)
Creatinine, Ser: 1.46 mg/dL — ABNORMAL HIGH (ref 0.61–1.24)
GFR, Estimated: 46 mL/min — ABNORMAL LOW (ref >60)
Glucose, Bld: 126 mg/dL — ABNORMAL HIGH (ref 70–99)
Potassium: 4.2 mmol/L (ref 3.5–5.1)
Sodium: 138 mmol/L (ref 135–145)

## 2021-02-21 LAB — CBC
HCT: 29.9 % — ABNORMAL LOW (ref 39.0–52.0)
Hemoglobin: 9.8 g/dL — ABNORMAL LOW (ref 13.0–17.0)
MCH: 30.2 pg (ref 26.0–34.0)
MCHC: 32.8 g/dL (ref 30.0–36.0)
MCV: 92.3 fL (ref 80.0–100.0)
Platelets: 238 10*3/uL (ref 150–400)
RBC: 3.24 MIL/uL — ABNORMAL LOW (ref 4.22–5.81)
RDW: 12.2 % (ref 11.5–15.5)
WBC: 9.1 10*3/uL (ref 4.0–10.5)
nRBC: 0 % (ref 0.0–0.2)

## 2021-02-21 LAB — SURGICAL PCR SCREEN
MRSA, PCR: NEGATIVE
Staphylococcus aureus: NEGATIVE

## 2021-02-21 SURGERY — FIXATION, FRACTURE, INTERTROCHANTERIC, WITH INTRAMEDULLARY ROD
Anesthesia: Spinal | Site: Hip | Laterality: Left

## 2021-02-21 SURGERY — INSERTION, INTRAMEDULLARY ROD, TIBIA
Anesthesia: General | Laterality: Left

## 2021-02-21 MED ORDER — BUPIVACAINE-EPINEPHRINE (PF) 0.5% -1:200000 IJ SOLN
INTRAMUSCULAR | Status: AC
  Filled 2021-02-21: qty 30

## 2021-02-21 MED ORDER — PROPOFOL 500 MG/50ML IV EMUL
INTRAVENOUS | Status: DC | PRN
  Administered 2021-02-21: 50 ug/kg/min via INTRAVENOUS

## 2021-02-21 MED ORDER — KETAMINE HCL 50 MG/ML IJ SOLN
INTRAMUSCULAR | Status: DC | PRN
  Administered 2021-02-21: 20 mg via INTRAVENOUS

## 2021-02-21 MED ORDER — BUPIVACAINE HCL (PF) 0.5 % IJ SOLN
INTRAMUSCULAR | Status: DC | PRN
Start: 2021-02-21 — End: 2021-02-21
  Administered 2021-02-21: 2.5 mL via INTRATHECAL

## 2021-02-21 MED ORDER — PROPOFOL 1000 MG/100ML IV EMUL
INTRAVENOUS | Status: AC
  Filled 2021-02-21: qty 100

## 2021-02-21 MED ORDER — LIDOCAINE HCL (PF) 1 % IJ SOLN
INTRAMUSCULAR | Status: AC
  Filled 2021-02-21: qty 30

## 2021-02-21 MED ORDER — SODIUM CHLORIDE 0.9 % IV SOLN
INTRAVENOUS | Status: DC | PRN
  Administered 2021-02-21: 10 mL/h via INTRAVENOUS

## 2021-02-21 MED ORDER — GLYCOPYRROLATE 0.2 MG/ML IJ SOLN
INTRAMUSCULAR | Status: DC | PRN
  Administered 2021-02-21: .1 mg via INTRAVENOUS

## 2021-02-21 MED ORDER — LIDOCAINE HCL (PF) 2 % IJ SOLN
INTRAMUSCULAR | Status: AC
  Filled 2021-02-21: qty 5

## 2021-02-21 MED ORDER — CHLORHEXIDINE GLUCONATE CLOTH 2 % EX PADS
6.0000 | MEDICATED_PAD | Freq: Every day | CUTANEOUS | Status: DC
  Administered 2021-02-21 – 2021-02-26 (×5): 6 via TOPICAL

## 2021-02-21 MED ORDER — GENTAMICIN SULFATE 40 MG/ML IJ SOLN
INTRAMUSCULAR | Status: DC | PRN
  Administered 2021-02-21: 80 mg

## 2021-02-21 MED ORDER — KETAMINE HCL 50 MG/5ML IJ SOSY
PREFILLED_SYRINGE | INTRAMUSCULAR | Status: AC
  Filled 2021-02-21: qty 5

## 2021-02-21 MED ORDER — PHENYLEPHRINE HCL-NACL 20-0.9 MG/250ML-% IV SOLN
INTRAVENOUS | Status: DC | PRN
  Administered 2021-02-21: 50 ug/min via INTRAVENOUS

## 2021-02-21 MED ORDER — ACETAMINOPHEN 500 MG PO TABS
500.0000 mg | ORAL_TABLET | Freq: Four times a day (QID) | ORAL | Status: DC | PRN
  Administered 2021-02-21: 500 mg via ORAL
  Filled 2021-02-21: qty 1

## 2021-02-21 MED ORDER — CEFAZOLIN SODIUM-DEXTROSE 2-3 GM-%(50ML) IV SOLR
INTRAVENOUS | Status: DC | PRN
  Administered 2021-02-21: 2 g via INTRAVENOUS

## 2021-02-21 MED ORDER — FENTANYL CITRATE (PF) 100 MCG/2ML IJ SOLN
INTRAMUSCULAR | Status: AC
  Filled 2021-02-21: qty 2

## 2021-02-21 MED ORDER — LIDOCAINE HCL 1 % IJ SOLN
INTRAMUSCULAR | Status: DC | PRN
  Administered 2021-02-21: 15 mL

## 2021-02-21 MED ORDER — 0.9 % SODIUM CHLORIDE (POUR BTL) OPTIME
TOPICAL | Status: DC | PRN
  Administered 2021-02-21: 1000 mL

## 2021-02-21 MED ORDER — CEFAZOLIN SODIUM-DEXTROSE 1-4 GM/50ML-% IV SOLN
1.0000 g | Freq: Three times a day (TID) | INTRAVENOUS | Status: AC
  Administered 2021-02-21 – 2021-02-22 (×3): 1 g via INTRAVENOUS
  Filled 2021-02-21 (×3): qty 50

## 2021-02-21 MED ORDER — PHENYLEPHRINE 40 MCG/ML (10ML) SYRINGE FOR IV PUSH (FOR BLOOD PRESSURE SUPPORT)
PREFILLED_SYRINGE | INTRAVENOUS | Status: DC | PRN
  Administered 2021-02-21: 160 ug via INTRAVENOUS
  Administered 2021-02-21: 80 ug via INTRAVENOUS
  Administered 2021-02-21: 120 ug via INTRAVENOUS

## 2021-02-21 MED ORDER — BUPIVACAINE-EPINEPHRINE 0.5% -1:200000 IJ SOLN
INTRAMUSCULAR | Status: DC | PRN
  Administered 2021-02-21: 15 mL

## 2021-02-21 MED ORDER — FENTANYL CITRATE (PF) 100 MCG/2ML IJ SOLN
INTRAMUSCULAR | Status: DC | PRN
  Administered 2021-02-21 (×2): 50 ug via INTRAVENOUS

## 2021-02-21 MED ORDER — GLYCOPYRROLATE 0.2 MG/ML IJ SOLN
INTRAMUSCULAR | Status: AC
  Filled 2021-02-21: qty 1

## 2021-02-21 MED ORDER — PROPOFOL 10 MG/ML IV BOLUS
INTRAVENOUS | Status: AC
  Filled 2021-02-21: qty 20

## 2021-02-21 MED ORDER — FENTANYL CITRATE (PF) 100 MCG/2ML IJ SOLN: 25.0000 ug | INTRAMUSCULAR | Status: DC | PRN

## 2021-02-21 MED ORDER — CEFAZOLIN SODIUM-DEXTROSE 2-4 GM/100ML-% IV SOLN
INTRAVENOUS | Status: AC
  Filled 2021-02-21: qty 100

## 2021-02-21 MED ORDER — GENTAMICIN SULFATE 40 MG/ML IJ SOLN
INTRAMUSCULAR | Status: AC
  Filled 2021-02-21: qty 2

## 2021-02-21 MED ORDER — LACTATED RINGERS IV SOLN: INTRAVENOUS | Status: DC | PRN

## 2021-02-21 SURGICAL SUPPLY — 40 items
BNDG COHESIVE 6X5 TAN ST LF (GAUZE/BANDAGES/DRESSINGS) ×6 IMPLANT
CHLORAPREP W/TINT 26 (MISCELLANEOUS) ×2 IMPLANT
DRAPE 3/4 80X56 (DRAPES) ×4 IMPLANT
DRAPE C-ARM 42X72 X-RAY (DRAPES) ×2 IMPLANT
DRAPE SURG 17X11 SM STRL (DRAPES) ×4 IMPLANT
DRAPE U-SHAPE 47X51 STRL (DRAPES) ×2 IMPLANT
DRSG OPSITE POSTOP 3X4 (GAUZE/BANDAGES/DRESSINGS) ×4 IMPLANT
DRSG OPSITE POSTOP 4X14 (GAUZE/BANDAGES/DRESSINGS) IMPLANT
DRSG OPSITE POSTOP 4X6 (GAUZE/BANDAGES/DRESSINGS) ×2 IMPLANT
ELECT REM PT RETURN 9FT ADLT (ELECTROSURGICAL) ×2
ELECTRODE REM PT RTRN 9FT ADLT (ELECTROSURGICAL) ×1 IMPLANT
GAUZE SPONGE 4X4 12PLY STRL (GAUZE/BANDAGES/DRESSINGS) ×2 IMPLANT
GAUZE XEROFORM 1X8 LF (GAUZE/BANDAGES/DRESSINGS) ×2 IMPLANT
GLOVE SURG ENC MOIS LTX SZ7.5 (GLOVE) ×2 IMPLANT
GLOVE SURG UNDER POLY LF SZ7.5 (GLOVE) ×2 IMPLANT
GOWN L4 XLG 20 PK N/S (GOWN DISPOSABLE) ×2 IMPLANT
GOWN STRL REUS W/ TWL XL LVL3 (GOWN DISPOSABLE) ×1 IMPLANT
GOWN STRL REUS W/TWL XL LVL3 (GOWN DISPOSABLE) ×1
HEMOVAC 400CC 10FR (MISCELLANEOUS) IMPLANT
HOLDER FOLEY CATH W/STRAP (MISCELLANEOUS) ×2 IMPLANT
KIT TURNOVER CYSTO (KITS) ×2 IMPLANT
MANIFOLD NEPTUNE II (INSTRUMENTS) ×2 IMPLANT
MAT ABSORB  FLUID 56X50 GRAY (MISCELLANEOUS) ×1
MAT ABSORB FLUID 56X50 GRAY (MISCELLANEOUS) ×1 IMPLANT
NS IRRIG 1000ML POUR BTL (IV SOLUTION) ×2 IMPLANT
PACK HIP COMPR (MISCELLANEOUS) ×2 IMPLANT
PAD ABD DERMACEA PRESS 5X9 (GAUZE/BANDAGES/DRESSINGS) ×2 IMPLANT
PAD ARMBOARD 7.5X6 YLW CONV (MISCELLANEOUS) ×2 IMPLANT
STAPLER SKIN PROX 35W (STAPLE) ×2 IMPLANT
SUCTION FRAZIER HANDLE 10FR (MISCELLANEOUS) ×1
SUCTION TUBE FRAZIER 10FR DISP (MISCELLANEOUS) ×1 IMPLANT
SUT VIC AB 0 CT1 36 (SUTURE) ×4 IMPLANT
SUT VIC AB 2-0 CT1 (SUTURE) ×2 IMPLANT
SUT VIC AB 2-0 CT1 27 (SUTURE) ×1
SUT VIC AB 2-0 CT1 TAPERPNT 27 (SUTURE) ×1 IMPLANT
SUT VICRYL 0 AB UR-6 (SUTURE) ×2 IMPLANT
SYR 30ML LL (SYRINGE) ×2 IMPLANT
TAPE PAPER 1/2X10 TAN MEDIPORE (MISCELLANEOUS) ×2 IMPLANT
TRAY FOLEY SLVR 16FR LF STAT (SET/KITS/TRAYS/PACK) ×2 IMPLANT
WATER STERILE IRR 500ML POUR (IV SOLUTION) ×2 IMPLANT

## 2021-02-21 SURGICAL SUPPLY — 51 items
BIT DRILL INTERTAN LAG SCREW (BIT) ×1 IMPLANT
BIT DRILL LONG 4.0 (BIT) IMPLANT
BLADE SURG 10 STRL SS SAFETY (BLADE) ×2 IMPLANT
BNDG COHESIVE 6X5 TAN ST LF (GAUZE/BANDAGES/DRESSINGS) ×6 IMPLANT
CHLORAPREP W/TINT 26 (MISCELLANEOUS) ×2 IMPLANT
DRAPE 3/4 80X56 (DRAPES) ×4 IMPLANT
DRAPE C-ARM 42X72 X-RAY (DRAPES) ×2 IMPLANT
DRAPE SURG 17X11 SM STRL (DRAPES) ×4 IMPLANT
DRAPE U-SHAPE 47X51 STRL (DRAPES) ×2 IMPLANT
DRILL BIT LONG 4.0 (BIT) ×2
DRSG OPSITE POSTOP 3X4 (GAUZE/BANDAGES/DRESSINGS) ×3 IMPLANT
DRSG OPSITE POSTOP 4X14 (GAUZE/BANDAGES/DRESSINGS) IMPLANT
DRSG OPSITE POSTOP 4X6 (GAUZE/BANDAGES/DRESSINGS) ×2 IMPLANT
ELECT REM PT RETURN 9FT ADLT (ELECTROSURGICAL) ×2
ELECTRODE REM PT RTRN 9FT ADLT (ELECTROSURGICAL) ×1 IMPLANT
GAUZE SPONGE 4X4 12PLY STRL (GAUZE/BANDAGES/DRESSINGS) ×2 IMPLANT
GAUZE XEROFORM 1X8 LF (GAUZE/BANDAGES/DRESSINGS) ×2 IMPLANT
GLOVE SURG ENC MOIS LTX SZ7.5 (GLOVE) ×2 IMPLANT
GLOVE SURG UNDER POLY LF SZ7.5 (GLOVE) ×2 IMPLANT
GOWN L4 XLG 20 PK N/S (GOWN DISPOSABLE) ×2 IMPLANT
GOWN STRL REUS W/ TWL XL LVL3 (GOWN DISPOSABLE) ×1 IMPLANT
GOWN STRL REUS W/TWL XL LVL3 (GOWN DISPOSABLE) ×1
GUIDE PIN 3.2X343 (PIN) ×2
GUIDE PIN 3.2X343MM (PIN) ×2
HEMOVAC 400CC 10FR (MISCELLANEOUS) IMPLANT
HOLDER FOLEY CATH W/STRAP (MISCELLANEOUS) ×2 IMPLANT
KIT PREVENA INCISION MGT20CM45 (CANNISTER) ×1 IMPLANT
KIT TURNOVER CYSTO (KITS) ×2 IMPLANT
MANIFOLD NEPTUNE II (INSTRUMENTS) ×2 IMPLANT
MAT ABSORB  FLUID 56X50 GRAY (MISCELLANEOUS) ×1
MAT ABSORB FLUID 56X50 GRAY (MISCELLANEOUS) ×1 IMPLANT
NAIL TRIGEN INTERTAN 10X18CM (Nail) ×1 IMPLANT
NS IRRIG 1000ML POUR BTL (IV SOLUTION) ×2 IMPLANT
PACK HIP COMPR (MISCELLANEOUS) ×2 IMPLANT
PAD ABD DERMACEA PRESS 5X9 (GAUZE/BANDAGES/DRESSINGS) ×2 IMPLANT
PAD ARMBOARD 7.5X6 YLW CONV (MISCELLANEOUS) ×2 IMPLANT
PIN GUIDE 3.2X343MM (PIN) IMPLANT
SCREW LAG COMPR KIT 95/90 (Screw) ×1 IMPLANT
SCREW TRIGEN LOW PROF 5.0X35 (Screw) ×1 IMPLANT
STAPLER SKIN PROX 35W (STAPLE) ×2 IMPLANT
SUCTION FRAZIER HANDLE 10FR (MISCELLANEOUS) ×1
SUCTION TUBE FRAZIER 10FR DISP (MISCELLANEOUS) ×1 IMPLANT
SUT VIC AB 0 CT1 36 (SUTURE) ×4 IMPLANT
SUT VIC AB 2-0 CT1 (SUTURE) ×2 IMPLANT
SUT VIC AB 2-0 CT1 27 (SUTURE) ×1
SUT VIC AB 2-0 CT1 TAPERPNT 27 (SUTURE) ×1 IMPLANT
SUT VICRYL 0 AB UR-6 (SUTURE) ×2 IMPLANT
SYR 30ML LL (SYRINGE) ×2 IMPLANT
TAPE PAPER 1/2X10 TAN MEDIPORE (MISCELLANEOUS) ×2 IMPLANT
TRAY FOLEY SLVR 16FR LF STAT (SET/KITS/TRAYS/PACK) ×2 IMPLANT
WATER STERILE IRR 500ML POUR (IV SOLUTION) ×2 IMPLANT

## 2021-02-21 NOTE — Consult Note (Signed)
ORTHOPAEDIC CONSULTATION  REQUESTING PHYSICIAN: [name], MD  Chief Complaint: Left hip pain  HPI: Jamie Brennan is a 88 y.o. male who complains of left hip pain after mechanical fall. The pain is sharp in character. The pain is worse with movement and better with rest. Denies any numbness, tingling or constitutional symptoms.  Patient is currently visiting his niece in [place], but states he lives in [place].  He typically utilizes a rolling walker for ambulation.  He is not on any anticoagulation.  Past Medical History:  Diagnosis Date   Prostate cancer [facility])    Past Surgical History:  Procedure Laterality Date   BACK SURGERY     PROSTATECTOMY     Social History   Socioeconomic History   Marital status: Married    Spouse name: Not on file   Number of children: Not on file   Years of education: Not on file   Highest education level: Not on file  Occupational History   Not on file  Tobacco Use   Smoking status: Never   Smokeless tobacco: Never  Vaping Use   Vaping Use: Never used  Substance and Sexual Activity   Alcohol use: Not Currently   Drug use: Never   Sexual activity: Not on file  Other Topics Concern   Not on file  Social History Narrative   Not on file   Social Determinants of Health   Financial Resource Strain: Not on file  Food Insecurity: Not on file  Transportation Needs: Not on file  Physical Activity: Not on file  Stress: Not on file  Social Connections: Not on file   History reviewed. No pertinent family history. No Known Allergies Prior to Admission medications   Medication Sig Start Date End Date Taking? Authorizing Provider  mupirocin cream (BACTROBAN) 2 % Apply 1 application topically 2 (two) times daily. For 5 days Patient not taking: Reported on 02/20/2021 07/16/19   [name], NP   CT HEAD WO CONTRAST (5MM)  Result Date: 02/20/2021 CLINICAL DATA:  Neck trauma (Age >= 65y); Head trauma, minor (Age >=  65y) EXAM: CT HEAD WITHOUT CONTRAST CT CERVICAL SPINE WITHOUT CONTRAST TECHNIQUE: Multidetector CT imaging of the head and cervical spine was performed following the standard protocol without intravenous contrast. Multiplanar CT image reconstructions of the cervical spine were also generated. RADIATION DOSE REDUCTION: This exam was performed according to the departmental dose-optimization program which includes automated exposure control, adjustment of the mA and/or kV according to patient size and/or use of iterative reconstruction technique. COMPARISON:  None. FINDINGS: CT HEAD FINDINGS BRAIN: BRAIN Prominence of the lateral ventricles may be related to central predominant atrophy, although a component of normal pressure/communicating hydrocephalus cannot be excluded. Patchy and confluent areas of decreased attenuation are noted throughout the deep and periventricular white matter of the cerebral hemispheres bilaterally, compatible with chronic microvascular ischemic disease. No evidence of large-territorial acute infarction. No parenchymal hemorrhage. No mass lesion. No extra-axial collection. No mass effect or midline shift. No hydrocephalus. Basilar cisterns are patent. Vascular: No hyperdense vessel. Skull: No acute fracture or focal lesion. Sinuses/Orbits: Paranasal sinuses and mastoid air cells are clear. Bilateral lens replacement. Otherwise the orbits are unremarkable. Other: None. CT CERVICAL SPINE FINDINGS Alignment: Normal. Skull base and vertebrae: Multilevel moderate to severe degenerative changes of the spine. Associated multilevel severe osseous neural foraminal stenosis. No severe osseous central canal stenosis. No acute fracture. No aggressive appearing focal osseous lesion or focal pathologic process. Soft tissues and spinal  canal: No prevertebral fluid or swelling. No visible canal hematoma. Upper chest: Unremarkable. Other: None. IMPRESSION: 1. No acute intracranial abnormality. 2. Prominence  of the lateral ventricles may be related to central predominant atrophy, although a component of normal pressure/communicating hydrocephalus cannot be excluded. 3. No acute displaced fracture or traumatic listhesis of the cervical spine. 4. Multilevel moderate to severe degenerative changes of the spine. Associated multilevel severe osseous neural foraminal stenosis. Electronically Signed   By: Iven Finn M.D.   On: 02/20/2021 20:23   CT Cervical Spine Wo Contrast  Result Date: 02/20/2021 CLINICAL DATA:  Neck trauma (Age >= 65y); Head trauma, minor (Age >= 65y) EXAM: CT HEAD WITHOUT CONTRAST CT CERVICAL SPINE WITHOUT CONTRAST TECHNIQUE: Multidetector CT imaging of the head and cervical spine was performed following the standard protocol without intravenous contrast. Multiplanar CT image reconstructions of the cervical spine were also generated. RADIATION DOSE REDUCTION: This exam was performed according to the departmental dose-optimization program which includes automated exposure control, adjustment of the mA and/or kV according to patient size and/or use of iterative reconstruction technique. COMPARISON:  None. FINDINGS: CT HEAD FINDINGS BRAIN: BRAIN Prominence of the lateral ventricles may be related to central predominant atrophy, although a component of normal pressure/communicating hydrocephalus cannot be excluded. Patchy and confluent areas of decreased attenuation are noted throughout the deep and periventricular white matter of the cerebral hemispheres bilaterally, compatible with chronic microvascular ischemic disease. No evidence of large-territorial acute infarction. No parenchymal hemorrhage. No mass lesion. No extra-axial collection. No mass effect or midline shift. No hydrocephalus. Basilar cisterns are patent. Vascular: No hyperdense vessel. Skull: No acute fracture or focal lesion. Sinuses/Orbits: Paranasal sinuses and mastoid air cells are clear. Bilateral lens replacement. Otherwise the  orbits are unremarkable. Other: None. CT CERVICAL SPINE FINDINGS Alignment: Normal. Skull base and vertebrae: Multilevel moderate to severe degenerative changes of the spine. Associated multilevel severe osseous neural foraminal stenosis. No severe osseous central canal stenosis. No acute fracture. No aggressive appearing focal osseous lesion or focal pathologic process. Soft tissues and spinal canal: No prevertebral fluid or swelling. No visible canal hematoma. Upper chest: Unremarkable. Other: None. IMPRESSION: 1. No acute intracranial abnormality. 2. Prominence of the lateral ventricles may be related to central predominant atrophy, although a component of normal pressure/communicating hydrocephalus cannot be excluded. 3. No acute displaced fracture or traumatic listhesis of the cervical spine. 4. Multilevel moderate to severe degenerative changes of the spine. Associated multilevel severe osseous neural foraminal stenosis. Electronically Signed   By: Iven Finn M.D.   On: 02/20/2021 20:23   CT PELVIS WO CONTRAST  Result Date: 02/20/2021 CLINICAL DATA:  Fall, left hip pain/fracture EXAM: CT PELVIS WITHOUT CONTRAST TECHNIQUE: Multidetector CT imaging of the pelvis was performed following the standard protocol without intravenous contrast. RADIATION DOSE REDUCTION: This exam was performed according to the departmental dose-optimization program which includes automated exposure control, adjustment of the mA and/or kV according to patient size and/or use of iterative reconstruction technique. COMPARISON:  Left hip radiographs dated 02/20/2021 FINDINGS: Urinary Tract:  Bladder is within normal limits. Bowel:  Visualized bowel is unremarkable. Vascular/Lymphatic: No evidence of aneurysm. Atherosclerotic calcifications. No suspicious pelvic lymphadenopathy. Reproductive:  Status post prostatectomy. Other:  No pelvic ascites. Musculoskeletal: Intertrochanteric left hip fracture, mildly comminuted, but without  significant displacement. No associated large fluid collection/hematoma. No pelvic fracture is seen. Mild degenerative changes of the lower lumbar spine with fusion at L4-5. IMPRESSION: Intertrochanteric left hip fracture. No pelvic fracture is seen. Electronically Signed  By: Julian Hy M.D.   On: 02/20/2021 20:21   DG Chest Portable 1 View  Result Date: 02/20/2021 CLINICAL DATA:  Fall EXAM: PORTABLE CHEST 1 VIEW COMPARISON:  None. FINDINGS: Heart size is normal. Tortuosity of the aorta is noted. The lungs are clear. The vascularity is normal. No effusion. Skin fold present on the right. No acute bone finding. IMPRESSION: No active disease. Electronically Signed   By: Nelson Chimes M.D.   On: 02/20/2021 19:15   DG Hip Unilat W or Wo Pelvis 2-3 Views Left  Result Date: 02/20/2021 CLINICAL DATA:  Fall. EXAM: DG HIP (WITH OR WITHOUT PELVIS) 2-3V LEFT COMPARISON:  None. FINDINGS: Mildly displaced and comminuted appearing intertrochanteric fracture of the left femoral neck. The bones are osteopenic. No dislocation. The soft tissues are unremarkable. Multiple surgical clips noted over the pelvis. IMPRESSION: Mildly displaced and comminuted intertrochanteric fracture of the left femoral neck. Electronically Signed   By: Anner Crete M.D.   On: 02/20/2021 19:14    Positive ROS: All other systems have been reviewed and were otherwise negative with the exception of those mentioned in the HPI and as above.  Physical Exam: General: Alert, no acute distress Cardiovascular: No pedal edema Respiratory: No cyanosis, no use of accessory musculature GI: No organomegaly, abdomen is soft and non-tender Skin: No lesions in the area of chief complaint Neurologic: Sensation intact distally Psychiatric: Patient is competent for consent with normal mood and affect Lymphatic: No axillary or cervical lymphadenopathy  MUSCULOSKELETAL:   Left lower extremity: Tenderness to palpation about the left hip.  Range  of motion deferred, no tenderness about the knee or ankle. Compartments soft. Good cap refill. Motor and sensory intact distally.  Assessment: 88 year old male admitted with a displaced left intertrochanteric hip fracture  Plan: -Appreciate hospitalist team support -We have a long discussion regarding risk and benefits of operative and nonoperative management.  Recommendation was made for left hip IM nail.  All questions were answered and informed consent was provided.  We will plan for surgery this morning.    Renee Harder, MD    02/21/2021 7:41 AM

## 2021-02-21 NOTE — Transfer of Care (Signed)
Immediate Anesthesia Transfer of Care Note  Patient: Jamie Brennan  Procedure(s) Performed: INTRAMEDULLARY (IM) NAIL INTERTROCHANTRIC (Left: Hip)  Patient Location: PACU  Anesthesia Type:Spinal  Level of Consciousness: drowsy  Airway & Oxygen Therapy: Patient Spontanous Breathing and Patient connected to nasal cannula oxygen  Post-op Assessment: Report given to RN and Post -op Vital signs reviewed and stable  Post vital signs: Reviewed and stable  Last Vitals:  Vitals Value Taken Time  BP 107/60 02/21/21 0943  Temp    Pulse 91 02/21/21 0944  Resp 15 02/21/21 0944  SpO2 100 % 02/21/21 0944  Vitals shown include unvalidated device data.  Last Pain:  Vitals:   02/21/21 0000  TempSrc:   PainSc: Asleep      Patients Stated Pain Goal: 0 (05/29/31 5825)  Complications: No notable events documented.

## 2021-02-21 NOTE — Anesthesia Procedure Notes (Addendum)
Spinal  Patient location during procedure: OR Start time: 02/21/2021 8:10 AM End time: 02/21/2021 8:23 AM Reason for block: surgical anesthesia Staffing Performed: resident/CRNA and anesthesiologist  Anesthesiologist: Martha Clan, MD Resident/CRNA: Orion Crook, CRNA Preanesthetic Checklist Completed: patient identified, IV checked, site marked, risks and benefits discussed, surgical consent, monitors and equipment checked, pre-op evaluation and timeout performed Spinal Block Patient position: right lateral decubitus Prep: DuraPrep Patient monitoring: heart rate, cardiac monitor, continuous pulse ox and blood pressure Approach: midline Location: L3-4 Injection technique: single-shot Needle Needle type: Sprotte  Needle gauge: 24 G Needle length: 9 cm Assessment Sensory level: T8 Events: CSF return Additional Notes IV functioning, monitors applied to pt. Expiration date of kit checked and confirmed to be in date. Sterile prep and drape, hand hygiene and sterile gloved used. Pt was positioned and spine was prepped in sterile fashion. Skin was anesthetized with lidocaine. Free flow of clear CSF obtained prior to injecting local anesthetic into CSF x 1 attempt. Spinal needle aspirated freely following injection. Needle was carefully withdrawn, and pt tolerated procedure well. Loss of motor and sensory on exam post injection.

## 2021-02-21 NOTE — Plan of Care (Signed)
°  Problem: Education: Goal: Knowledge of General Education information will improve Description: Including pain rating scale, medication(s)/side effects and non-pharmacologic comfort measures Outcome: Progressing   Problem: Health Behavior/Discharge Planning: Goal: Ability to manage health-related needs will improve Outcome: Progressing   Problem: Clinical Measurements: Goal: Ability to maintain clinical measurements within normal limits will improve Outcome: Progressing Goal: Will remain free from infection Outcome: Progressing Goal: Diagnostic test results will improve Outcome: Progressing Goal: Respiratory complications will improve Outcome: Progressing Goal: Cardiovascular complication will be avoided Outcome: Progressing   Problem: Activity: Goal: Risk for activity intolerance will decrease Outcome: Progressing   Problem: Nutrition: Goal: Adequate nutrition will be maintained Outcome: Progressing   Problem: Coping: Goal: Level of anxiety will decrease Outcome: Progressing   Problem: Elimination: Goal: Will not experience complications related to bowel motility Outcome: Progressing Goal: Will not experience complications related to urinary retention Outcome: Progressing   Problem: Pain Managment: Goal: General experience of comfort will improve Outcome: Progressing   Problem: Safety: Goal: Ability to remain free from injury will improve Outcome: Progressing   Problem: Skin Integrity: Goal: Risk for impaired skin integrity will decrease Outcome: Progressing   Problem: Education: Goal: Verbalization of understanding the information provided (i.e., activity precautions, restrictions, etc) will improve Outcome: Progressing Goal: Individualized Educational Video(s) Outcome: Progressing   Problem: Clinical Measurements: Goal: Postoperative complications will be avoided or minimized Outcome: Progressing   Problem: Self-Concept: Goal: Ability to maintain and  perform role responsibilities to the fullest extent possible will improve Outcome: Progressing   Problem: Pain Management: Goal: Pain level will decrease Outcome: Progressing

## 2021-02-21 NOTE — Progress Notes (Signed)
PROGRESS NOTE    COLLAN SCHOENFELD  NID:[id number] DOB: Mar 10, 1931 DOA: 02/20/2021 PCP: System, Provider Not In    Assessment & Plan:   Principal Problem:   Closed left hip fracture ([place])   Closed left hip fracture: secondary to mechanical fall. S/p left intermedullary nail intertrochanteric as per ortho surg. Tylenol, morphine, norco prn for pain   CKDIIIa: Cr is trending down from day prior. Avoid nephrotoxic meds  Normocytic anemia: H&H are trending down, possibly a component of acute blood loss anemia secondary to above surgery. No need for a transfusion currently    DVT prophylaxis: SCDs Code Status: DNR Family Communication: discussed pt's care w/ pt's family at bedside and answered their questions  Disposition Plan: depends on PT/OT recs   Level of care: Med-Surg  Status is: Inpatient Remains inpatient appropriate because: severity of illness,      Consultants:  Ortho surg   Procedures:   Antimicrobials:    Subjective: Pt c/o left hip pain   Objective: Vitals:   02/20/21 1829 02/20/21 2128 02/20/21 2301 02/21/21 0458  BP: (!) 160/108 140/85 135/82 132/75  Pulse: 89 89 86 89  Resp: 16 16 18 16   Temp: 98 F (36.7 C)  98.5 F (36.9 C) 98.5 F (36.9 C)  TempSrc: Oral  Oral   SpO2: 99% 99% 98% 99%  Weight:      Height:       No intake or output data in the 24 hours ending 02/21/21 0810 Filed Weights   02/20/21 1826  Weight: 81.6 kg    Examination:  General exam: Appears calm and comfortable  Respiratory system: Clear to auscultation. Respiratory effort normal. Cardiovascular system: S1 & S2 +. No rubs, gallops or clicks. Gastrointestinal system: Abdomen is nondistended, soft and nontender. Normal bowel sounds heard. Central nervous system: Alert and oriented. Moves all extremities  Psychiatry: Judgement and insight appear normal. Mood & affect appropriate.     Data Reviewed: I have personally reviewed following labs and imaging  studies  CBC: Recent Labs  Lab 02/20/21 1920 02/21/21 0514  WBC 8.7 9.1  NEUTROABS 7.2  --   HGB 12.2* 9.8*  HCT 37.2* 29.9*  MCV 92.8 92.3  PLT 286 144   Basic Metabolic Panel: Recent Labs  Lab 02/20/21 1920 02/21/21 0514  NA 139 138  K 4.6 4.2  CL 105 106  CO2 24 21*  GLUCOSE 136* 126*  BUN 35* 35*  CREATININE 1.62* 1.46*  CALCIUM 9.0 8.5*   GFR: Estimated Creatinine Clearance: 36.8 mL/min (A) (by C-G formula based on SCr of 1.46 mg/dL (H)). Liver Function Tests: Recent Labs  Lab 02/20/21 1920  AST 28  ALT 30  ALKPHOS 97  BILITOT 0.7  PROT 8.2*  ALBUMIN 4.3   No results for input(s): LIPASE, AMYLASE in the last 168 hours. No results for input(s): AMMONIA in the last 168 hours. Coagulation Profile: Recent Labs  Lab 02/20/21 1920  INR 1.0   Cardiac Enzymes: No results for input(s): CKTOTAL, CKMB, CKMBINDEX, TROPONINI in the last 168 hours. BNP (last 3 results) No results for input(s): PROBNP in the last 8760 hours. HbA1C: No results for input(s): HGBA1C in the last 72 hours. CBG: No results for input(s): GLUCAP in the last 168 hours. Lipid Profile: No results for input(s): CHOL, HDL, LDLCALC, TRIG, CHOLHDL, LDLDIRECT in the last 72 hours. Thyroid Function Tests: No results for input(s): TSH, T4TOTAL, FREET4, T3FREE, THYROIDAB in the last 72 hours. Anemia Panel: No results for input(s): VITAMINB12,  FOLATE, FERRITIN, TIBC, IRON, RETICCTPCT in the last 72 hours. Sepsis Labs: No results for input(s): PROCALCITON, LATICACIDVEN in the last 168 hours.  Recent Results (from the past 240 hour(s))  Resp Panel by RT-PCR (Flu A&B, Covid) Nasopharyngeal Swab     Status: None   Collection Time: 02/20/21  7:20 PM   Specimen: Nasopharyngeal Swab; Nasopharyngeal(NP) swabs in vial transport medium  Result Value Ref Range Status   SARS Coronavirus 2 by RT PCR NEGATIVE NEGATIVE Final    Comment: (NOTE) SARS-CoV-2 target nucleic acids are NOT DETECTED.  The  SARS-CoV-2 RNA is generally detectable in upper respiratory specimens during the acute phase of infection. The lowest concentration of SARS-CoV-2 viral copies this assay can detect is 138 copies/mL. A negative result does not preclude SARS-Cov-2 infection and should not be used as the sole basis for treatment or other patient management decisions. A negative result may occur with  improper specimen collection/handling, submission of specimen other than nasopharyngeal swab, presence of viral mutation(s) within the areas targeted by this assay, and inadequate number of viral copies(<138 copies/mL). A negative result must be combined with clinical observations, patient history, and epidemiological information. The expected result is Negative.  Fact Sheet for Patients:  EntrepreneurPulse.com.au  Fact Sheet for Healthcare Providers:  IncredibleEmployment.be  This test is no t yet approved or cleared by the [place] FDA and  has been authorized for detection and/or diagnosis of SARS-CoV-2 by FDA under an Emergency Use Authorization (EUA). This EUA will remain  in effect (meaning this test can be used) for the duration of the COVID-19 declaration under Section 564(b)(1) of the Act, 21 U.S.C.section 360bbb-3(b)(1), unless the authorization is terminated  or revoked sooner.       Influenza A by PCR NEGATIVE NEGATIVE Final   Influenza B by PCR NEGATIVE NEGATIVE Final    Comment: (NOTE) The Xpert Xpress SARS-CoV-2/FLU/RSV plus assay is intended as an aid in the diagnosis of influenza from Nasopharyngeal swab specimens and should not be used as a sole basis for treatment. Nasal washings and aspirates are unacceptable for Xpert Xpress SARS-CoV-2/FLU/RSV testing.  Fact Sheet for Patients: EntrepreneurPulse.com.au  Fact Sheet for Healthcare Providers: IncredibleEmployment.be  This test is not yet approved or  cleared by the [place] FDA and has been authorized for detection and/or diagnosis of SARS-CoV-2 by FDA under an Emergency Use Authorization (EUA). This EUA will remain in effect (meaning this test can be used) for the duration of the COVID-19 declaration under Section 564(b)(1) of the Act, 21 U.S.C. section 360bbb-3(b)(1), unless the authorization is terminated or revoked.  Performed at [facility], [address], [place], [place] [address]   Surgical PCR screen     Status: None   Collection Time: 02/20/21 11:42 PM   Specimen: Nasal Mucosa; Nasal Swab  Result Value Ref Range Status   MRSA, PCR NEGATIVE NEGATIVE Final   Staphylococcus aureus NEGATIVE NEGATIVE Final    Comment: (NOTE) The Xpert SA Assay (FDA approved for NASAL specimens in patients 15 years of age and older), is one component of a comprehensive surveillance program. It is not intended to diagnose infection nor to guide or monitor treatment. Performed at [facility], [address] [place], [place] [address]          Radiology Studies: CT HEAD WO CONTRAST (5MM)  Result Date: 02/20/2021 CLINICAL DATA:  Neck trauma (Age >= 65y); Head trauma, minor (Age >= 65y) EXAM: CT HEAD WITHOUT CONTRAST CT CERVICAL SPINE WITHOUT CONTRAST  TECHNIQUE: Multidetector CT imaging of the head and cervical spine was performed following the standard protocol without intravenous contrast. Multiplanar CT image reconstructions of the cervical spine were also generated. RADIATION DOSE REDUCTION: This exam was performed according to the departmental dose-optimization program which includes automated exposure control, adjustment of the mA and/or kV according to patient size and/or use of iterative reconstruction technique. COMPARISON:  None. FINDINGS: CT HEAD FINDINGS BRAIN: BRAIN Prominence of the lateral ventricles may be related to central predominant atrophy, although a component of normal pressure/communicating  hydrocephalus cannot be excluded. Patchy and confluent areas of decreased attenuation are noted throughout the deep and periventricular white matter of the cerebral hemispheres bilaterally, compatible with chronic microvascular ischemic disease. No evidence of large-territorial acute infarction. No parenchymal hemorrhage. No mass lesion. No extra-axial collection. No mass effect or midline shift. No hydrocephalus. Basilar cisterns are patent. Vascular: No hyperdense vessel. Skull: No acute fracture or focal lesion. Sinuses/Orbits: Paranasal sinuses and mastoid air cells are clear. Bilateral lens replacement. Otherwise the orbits are unremarkable. Other: None. CT CERVICAL SPINE FINDINGS Alignment: Normal. Skull base and vertebrae: Multilevel moderate to severe degenerative changes of the spine. Associated multilevel severe osseous neural foraminal stenosis. No severe osseous central canal stenosis. No acute fracture. No aggressive appearing focal osseous lesion or focal pathologic process. Soft tissues and spinal canal: No prevertebral fluid or swelling. No visible canal hematoma. Upper chest: Unremarkable. Other: None. IMPRESSION: 1. No acute intracranial abnormality. 2. Prominence of the lateral ventricles may be related to central predominant atrophy, although a component of normal pressure/communicating hydrocephalus cannot be excluded. 3. No acute displaced fracture or traumatic listhesis of the cervical spine. 4. Multilevel moderate to severe degenerative changes of the spine. Associated multilevel severe osseous neural foraminal stenosis. Electronically Signed   By: Iven Finn M.D.   On: 02/20/2021 20:23   CT Cervical Spine Wo Contrast  Result Date: 02/20/2021 CLINICAL DATA:  Neck trauma (Age >= 65y); Head trauma, minor (Age >= 65y) EXAM: CT HEAD WITHOUT CONTRAST CT CERVICAL SPINE WITHOUT CONTRAST TECHNIQUE: Multidetector CT imaging of the head and cervical spine was performed following the standard  protocol without intravenous contrast. Multiplanar CT image reconstructions of the cervical spine were also generated. RADIATION DOSE REDUCTION: This exam was performed according to the departmental dose-optimization program which includes automated exposure control, adjustment of the mA and/or kV according to patient size and/or use of iterative reconstruction technique. COMPARISON:  None. FINDINGS: CT HEAD FINDINGS BRAIN: BRAIN Prominence of the lateral ventricles may be related to central predominant atrophy, although a component of normal pressure/communicating hydrocephalus cannot be excluded. Patchy and confluent areas of decreased attenuation are noted throughout the deep and periventricular white matter of the cerebral hemispheres bilaterally, compatible with chronic microvascular ischemic disease. No evidence of large-territorial acute infarction. No parenchymal hemorrhage. No mass lesion. No extra-axial collection. No mass effect or midline shift. No hydrocephalus. Basilar cisterns are patent. Vascular: No hyperdense vessel. Skull: No acute fracture or focal lesion. Sinuses/Orbits: Paranasal sinuses and mastoid air cells are clear. Bilateral lens replacement. Otherwise the orbits are unremarkable. Other: None. CT CERVICAL SPINE FINDINGS Alignment: Normal. Skull base and vertebrae: Multilevel moderate to severe degenerative changes of the spine. Associated multilevel severe osseous neural foraminal stenosis. No severe osseous central canal stenosis. No acute fracture. No aggressive appearing focal osseous lesion or focal pathologic process. Soft tissues and spinal canal: No prevertebral fluid or swelling. No visible canal hematoma. Upper chest: Unremarkable. Other: None. IMPRESSION: 1. No acute intracranial  abnormality. 2. Prominence of the lateral ventricles may be related to central predominant atrophy, although a component of normal pressure/communicating hydrocephalus cannot be excluded. 3. No acute  displaced fracture or traumatic listhesis of the cervical spine. 4. Multilevel moderate to severe degenerative changes of the spine. Associated multilevel severe osseous neural foraminal stenosis. Electronically Signed   By: Iven Finn M.D.   On: 02/20/2021 20:23   CT PELVIS WO CONTRAST  Result Date: 02/20/2021 CLINICAL DATA:  Fall, left hip pain/fracture EXAM: CT PELVIS WITHOUT CONTRAST TECHNIQUE: Multidetector CT imaging of the pelvis was performed following the standard protocol without intravenous contrast. RADIATION DOSE REDUCTION: This exam was performed according to the departmental dose-optimization program which includes automated exposure control, adjustment of the mA and/or kV according to patient size and/or use of iterative reconstruction technique. COMPARISON:  Left hip radiographs dated 02/20/2021 FINDINGS: Urinary Tract:  Bladder is within normal limits. Bowel:  Visualized bowel is unremarkable. Vascular/Lymphatic: No evidence of aneurysm. Atherosclerotic calcifications. No suspicious pelvic lymphadenopathy. Reproductive:  Status post prostatectomy. Other:  No pelvic ascites. Musculoskeletal: Intertrochanteric left hip fracture, mildly comminuted, but without significant displacement. No associated large fluid collection/hematoma. No pelvic fracture is seen. Mild degenerative changes of the lower lumbar spine with fusion at L4-5. IMPRESSION: Intertrochanteric left hip fracture. No pelvic fracture is seen. Electronically Signed   By: Julian Hy M.D.   On: 02/20/2021 20:21   DG Chest Portable 1 View  Result Date: 02/20/2021 CLINICAL DATA:  Fall EXAM: PORTABLE CHEST 1 VIEW COMPARISON:  None. FINDINGS: Heart size is normal. Tortuosity of the aorta is noted. The lungs are clear. The vascularity is normal. No effusion. Skin fold present on the right. No acute bone finding. IMPRESSION: No active disease. Electronically Signed   By: Nelson Chimes M.D.   On: 02/20/2021 19:15   DG Hip  Unilat W or Wo Pelvis 2-3 Views Left  Result Date: 02/20/2021 CLINICAL DATA:  Fall. EXAM: DG HIP (WITH OR WITHOUT PELVIS) 2-3V LEFT COMPARISON:  None. FINDINGS: Mildly displaced and comminuted appearing intertrochanteric fracture of the left femoral neck. The bones are osteopenic. No dislocation. The soft tissues are unremarkable. Multiple surgical clips noted over the pelvis. IMPRESSION: Mildly displaced and comminuted intertrochanteric fracture of the left femoral neck. Electronically Signed   By: Anner Crete M.D.   On: 02/20/2021 19:14        Scheduled Meds:  pneumococcal 23 valent vaccine  0.5 mL Intramuscular Tomorrow-1000   Continuous Infusions:  ceFAZolin      ceFAZolin (ANCEF) IV       LOS: 1 day    Time spent: 32 mins     [name], MD [facility] Pager [phone]  If 7PM-7AM, please contact night-coverage 02/21/2021, 8:10 AM

## 2021-02-21 NOTE — Anesthesia Preprocedure Evaluation (Signed)
Anesthesia Evaluation  Patient identified by MRN, date of birth, ID band Patient awake    Reviewed: Allergy & Precautions, H&P , NPO status , Patient's Chart, lab work & pertinent test results, reviewed documented beta blocker date and time   History of Anesthesia Complications Negative for: history of anesthetic complications  Airway Mallampati: II  TM Distance: >3 FB Neck ROM: full    Dental  (+) Partial Lower, Edentulous Upper, Upper Dentures, Dental Advidsory Given   Pulmonary neg pulmonary ROS,    Pulmonary exam normal breath sounds clear to auscultation       Cardiovascular Exercise Tolerance: Good negative cardio ROS Normal cardiovascular exam Rhythm:regular Rate:Normal     Neuro/Psych negative neurological ROS  negative psych ROS   GI/Hepatic negative GI ROS, Neg liver ROS,   Endo/Other  negative endocrine ROS  Renal/GU negative Renal ROS  negative genitourinary   Musculoskeletal   Abdominal   Peds  Hematology negative hematology ROS (+)   Anesthesia Other Findings Past Medical History: No date: Prostate cancer (HCC)   Reproductive/Obstetrics negative OB ROS                             Anesthesia Physical Anesthesia Plan  ASA: 2  Anesthesia Plan: Spinal   Post-op Pain Management:    Induction:   PONV Risk Score and Plan: 1 and Propofol infusion and TIVA  Airway Management Planned: Natural Airway and Nasal Cannula  Additional Equipment:   Intra-op Plan:   Post-operative Plan:   Informed Consent: I have reviewed the patients History and Physical, chart, labs and discussed the procedure including the risks, benefits and alternatives for the proposed anesthesia with the patient or authorized representative who has indicated his/her understanding and acceptance.     Dental Advisory Given  Plan Discussed with: Anesthesiologist, CRNA and Surgeon  Anesthesia Plan  Comments:         Anesthesia Quick Evaluation

## 2021-02-21 NOTE — Op Note (Signed)
DATE OF SURGERY:  02/21/2021  TIME: 9:41 AM  PATIENT NAME:  Jamie Brennan  AGE: 88 y.o.  PRE-OPERATIVE DIAGNOSIS: Left intertrochanteric HIP FX  POST-OPERATIVE DIAGNOSIS:  SAME  PROCEDURE:  LEFT INTRAMEDULLARY (IM) NAIL INTERTROCHANTRIC  SURGEON:  Renee Harder  EBL:  779 cc  COMPLICATIONS: None  OPERATIVE IMPLANTS: [employer] Intertan femoral nail 10 mm x 180 mm  PREOPERATIVE INDICATIONS:  LINDLEY HINEY is a 88 y.o. year old who fell and suffered a left intertrochanteric hip fracture. He was brought into the ER and then admitted and optimized and then elected for surgical intervention.    The risks benefits and alternatives were discussed with the patient including but not limited to the risks of nonoperative treatment, versus surgical intervention including infection, bleeding, nerve injury, malunion, nonunion, hardware prominence, hardware failure, need for hardware removal, blood clots, cardiopulmonary complications, morbidity, mortality, among others, and they were willing to proceed.    OPERATIVE PROCEDURE:  The patient was brought to the operating room and placed in the supine position.  Combined spinal, general anesthesia was administered, with a foley. He was placed on the fracture table.  Closed reduction was performed under C-arm guidance. The length of the femur was also measured using fluoroscopy. Time out was then performed after sterile prep and drape. He received preoperative antibiotics.  Incision was made proximal to the greater trochanter. A guidewire was placed in the appropriate position. Confirmation was made on AP and lateral views. The above-named nail was opened. I opened the proximal femur with a reamer. I then placed the nail by hand easily down. I did not need to ream the femur.  Once the nail was completely seated, I placed a guidepin into the femoral head into the center center position through a second incision.  I measured the length,  and then reamed the lateral cortex and up into the head. I then placed the two interlocking lag screws. Slight compression was applied. Anatomic fixation achieved. Bone quality was adequate.  I then secured the proximal interlocking screws and placed a distal interlocking screw through the jig.  I then removed the instruments, and took final C-arm pictures AP and lateral the entire length of the femur. anatomic reconstruction was achieved, and the wounds were irrigated copiously and closed with Vicryl  followed by staples and dry sterile dressing. Sponge and needle count were correct.   The patient was awakened and returned to PACU in stable and satisfactory condition. There no complications and the patient tolerated the procedure well.   POSTOPERATIVE PLAN: He will be weightbearing as tolerated.   Ok to start DVT ppx POD#1 -Lovenox x14 days Dressing change by nursing staff as needed to keep dressing clean and dry Outpatient f/u in clinic in 2 weeks for staple removal and xrays  Renee Harder

## 2021-02-22 MED ORDER — ENSURE SURGERY PO LIQD
237.0000 mL | Freq: Two times a day (BID) | ORAL | Status: DC
  Administered 2021-02-22 – 2021-02-25 (×3): 237 mL via ORAL
  Filled 2021-02-22: qty 237

## 2021-02-22 NOTE — TOC Initial Note (Addendum)
Transition of Care [facility]) - Initial/Assessment Note    Patient Details  Name: Jamie Brennan MRN: [id number] Date of Birth: 25-Apr-1931  Transition of Care [facility]) CM/SW Contact:    [name], RN Phone Number: 02/22/2021, 4:14 PM  Clinical Narrative:  2/11: Patient in pain/medicated/hard of hearing without aids present. Contacted Donald Prose, niece, in [place], at [phone] [facility])  for background information.   Ms Jerelene Redden indicated that patient had been staying with her for temporary care/support after a fall in August 2022 and cold exposure outside with Covid.   Was discharged to Rehab at a place called [facility] in [place], [place] where patient lives alone in split level house. Could not locate a [facility] rehab but there is a [facility]) rehab in [place], [place]. [address], [place] [address]. [phone]  She said patient will act like he agrees or understands whether he does or not without the hearing aids. Ms Jerelene Redden is also caretaker for mother in her home with dementia as well as for her uncle, who was able to assist her before this current fall.    Also, expressed that Medicare reached out to her saying that she had right to appeal any discharge if she felt it was too soon.  Discussed PT/OT recommendations and permission for general bed search to begin.  Directed to website [email or web address], but patient also knew she did not want [facility] or [facility]. Prefers [place] or [place].  No past PASRR in [place]. Using niece address NGE #[phone] [id number]  She wants to speaks to a cousin tonight and wants to bring patient hearing aids tomorrow at [facility] to talk to him about it.    Simmie Davies RN CM   Expected Discharge Plan: Skilled Nursing Facility Barriers to Discharge: Continued Medical Work up   Patient Goals and CMS Choice   CMS Medicare.gov Compare Post Acute Care list provided to:: Other (Comment Required) (Niece listed in designated party as patient medicated and HOH)     Expected Discharge Plan and Services Expected Discharge Plan: [place]   Discharge Planning Services: CM Consult   Living arrangements for the past 2 months:  (with Niece past STR in [place] after a fall in August 2022)                                      Prior Living Arrangements/Services Living arrangements for the past 2 months:  (with Niece past STR in [place] after a fall in August 2022) Lives with:: Relatives (Temporary) Patient language and need for interpreter reviewed:: No (HoH and hearing aids not present)        Need for Family Participation in Patient Care: Yes (Comment) Care giver support system in place?: Yes (comment)   Criminal Activity/Legal Involvement Pertinent to Current Situation/Hospitalization: No - Comment as needed  Activities of Daily Living Home Assistive Devices/Equipment: None ADL Screening (condition at time of admission) Patient's cognitive ability adequate to safely complete daily activities?: Yes Is the patient deaf or have difficulty hearing?: Yes (hearing aides at home) Does the patient have difficulty seeing, even when wearing glasses/contacts?: No Does the patient have difficulty concentrating, remembering, or making decisions?: No Patient able to express need for assistance with ADLs?: Yes Does the patient have difficulty dressing or bathing?: No Independently performs ADLs?: No Communication: Independent Dressing (OT): Independent Grooming: Independent Feeding: Independent Bathing: Independent Toileting: Independent In/Out  Bed: Independent Walks in Home: Independent Does the patient have difficulty walking or climbing stairs?: No Weakness of Legs: None Weakness of Arms/Hands: None  Permission Sought/Granted Permission sought to share information with : Facility Sport and exercise psychologist, Family Supports (Niece and nephew listed in [place].) Permission granted to share information with : Yes, Verbal Permission Granted  Donald Prose (Niece)   [phone] (Mobile))  Share Information with NAME: Donald Prose     Permission granted to share info w Relationship: Niece  Permission granted to share info w Contact Information: Donald Prose (Niece)   [phone] (Mobile)  Emotional Assessment       Orientation: : Oriented to Self, Oriented to Place, Oriented to  Time Alcohol / Substance Use: Not Applicable Psych Involvement: No (comment)  Admission diagnosis:  Closed left hip fracture ([place]) [S72.002A] Fall, initial encounter [W19.XXXA] Closed fracture of neck of left femur, initial encounter [facility]) [S72.002A] Patient Active Problem List   Diagnosis Date Noted   Closed left hip fracture ([place]) 02/20/2021   PCP:  System, Provider Not In Pharmacy:   [id number] [place], [address] [place] [address] Phone: [phone] Fax: [phone]  [place], [place] - [address] [place] [address] Phone: [phone] Fax: [phone]     Social Determinants of Health (SDOH) Interventions    Readmission Risk Interventions No flowsheet data found.

## 2021-02-22 NOTE — TOC Progression Note (Signed)
Transition of Care [facility]) - Progression Note    Patient Details  Name: Jamie Brennan MRN: [id number] Date of Birth: 30-Dec-1931  Transition of Care [facility]) CM/SW Contact  [name], RN Phone Number: 02/22/2021, 5:03 PM  Clinical Narrative:  2/12: Broad bed search initiated per niece until she can bring patient's hearing aids and speak to him directly. She does not want [facility] or [facility]. Simmie Davies RN CM      Expected Discharge Plan: Skilled Nursing Facility Barriers to Discharge: Continued Medical Work up  Expected Discharge Plan and Services Expected Discharge Plan: [place]   Discharge Planning Services: CM Consult   Living arrangements for the past 2 months:  (with Niece past STR in [place] after a fall in August 2022)                                       Social Determinants of Health (SDOH) Interventions    Readmission Risk Interventions No flowsheet data found.

## 2021-02-22 NOTE — Evaluation (Signed)
Physical Therapy Evaluation Patient Details Name: Jamie Brennan MRN: [id number] DOB: 01/20/1931 Today's Date: 02/22/2021  History of Present Illness  Patient is a 88 year old male who fell and suffered a left intertrochanteric hip fracture s/p IM nail. [place] from [place] and reports he is usually independent with mobility.   Clinical Impression  PT evaluation completed. Patient was sitting up in the recliner chair on arrival to room and was agreeable to mobilizing. He reports he is previously independent with mobility.  The patient required maximal assistance for sit to stand transfer with cues for technique. He was able to stand for less than 2 minutes with pre-gait activity performed using rolling walker for support. Unable to progress to walking due to pain with weight bearing to LLE. He is not at his baseline level of functional mobility. Recommend PT to maximize independence and facilitate return to prior level of function. SNF is recommended at discharge.      Recommendations for follow up therapy are one component of a multi-disciplinary discharge planning process, led by the attending physician.  Recommendations may be updated based on patient status, additional functional criteria and insurance authorization.  Follow Up Recommendations Skilled nursing-short term rehab (<3 hours/day)    Assistance Recommended at Discharge Frequent or constant Supervision/Assistance  Patient can return home with the following  A lot of help with walking and/or transfers;A lot of help with bathing/dressing/bathroom;Assist for transportation;Help with stairs or ramp for entrance    Equipment Recommendations Rolling walker (2 wheels)  Recommendations for Other Services       Functional Status Assessment Patient has had a recent decline in their functional status and demonstrates the ability to make significant improvements in function in a reasonable and predictable amount of time.      Precautions / Restrictions Precautions Precautions: Fall Precaution Comments: provided post-op hip fracture HEP Restrictions Weight Bearing Restrictions: Yes LLE Weight Bearing: Weight bearing as tolerated      Mobility  Bed Mobility               General bed mobility comments: not assessed as patient sitting up on arrival and post session    Transfers Overall transfer level: Needs assistance Equipment used: Rolling walker (2 wheels) Transfers: Sit to/from Stand Sit to Stand: Max assist           General transfer comment: assistance for lifting and lowering provided. decreased eccentric control. verbal cues for technique and hand placement    Ambulation/Gait             Pre-gait activities: weight shifting performed in standing to right in preparation for advancement of LLE. patient has difficulty weight shifting to left due to pain with increased weight bearing of LLE. cues provided for UE support on rolling walker in preparation for ambulation. standing tolerance less than 2 minutes General Gait Details: unable to progress to walking due to limited standing tolerance  Stairs            Wheelchair Mobility    Modified Rankin (Stroke Patients Only)       Balance Overall balance assessment: Needs assistance, History of Falls Sitting-balance support: Feet supported Sitting balance-Leahy Scale: Good     Standing balance support: Bilateral upper extremity supported, Reliant on assistive device for balance Standing balance-Leahy Scale: Fair Standing balance comment: Min guard for safety in standing with UE supported on rolling walker  Pertinent Vitals/Pain Pain Assessment Pain Assessment: Faces Faces Pain Scale: Hurts little more Pain Location: left hip during mobility Pain Descriptors / Indicators: Sore Pain Intervention(s): Limited activity within patient's tolerance, Ice applied, Repositioned     Home Living Family/patient expects to be discharged to:: Private residence Living Arrangements: Alone Available Help at Discharge: Family Type of Home: House             Additional Comments: the patient reports he is visiting from out of state. he is staying here with his niece [name] where he can stay on the main floor with bedroom/bathroom. he has some confusion during session and is a poor historian at times    Prior Function Prior Level of Function : Independent/Modified Independent             Mobility Comments: independent per patient report ADLs Comments: independent per patient report     Hand Dominance        Extremity/Trunk Assessment   Upper Extremity Assessment Upper Extremity Assessment: Overall WFL for tasks assessed    Lower Extremity Assessment Lower Extremity Assessment: LLE deficits/detail;RLE deficits/detail RLE Deficits / Details: grossly WFL for tasks assessed LLE Deficits / Details: pain with AROM of left hip. patient is able to perform LAQ with assistance in sitting. no knee buckling with weight bearing       Communication   Communication: No difficulties  Cognition Arousal/Alertness: Awake/alert Behavior During Therapy: WFL for tasks assessed/performed Overall Cognitive Status: No family/caregiver present to determine baseline cognitive functioning                                 General Comments: patient is disoriented to place but oriented to situation and person. he is able to follow single step commands with extra time        General Comments      Exercises General Exercises - Lower Extremity Ankle Circles/Pumps: AROM, Strengthening, Both, 10 reps, Seated Long Arc Quad: Strengthening, AAROM, Left, 10 reps, Seated Hip ABduction/ADduction: AAROM, Strengthening, Left, 10 reps, Seated Other Exercises Other Exercises: verbal cues for exercise technique   Assessment/Plan    PT Assessment Patient needs continued  PT services  PT Problem List Decreased strength;Decreased range of motion;Decreased activity tolerance;Decreased balance;Decreased mobility;Decreased safety awareness;Pain       PT Treatment Interventions DME instruction;Gait training;Stair training;Functional mobility training;Therapeutic activities;Therapeutic exercise;Balance training;Neuromuscular re-education;Patient/family education    PT Goals (Current goals can be found in the Care Plan section)  Acute Rehab PT Goals Patient Stated Goal: to be able to walk PT Goal Formulation: With patient Time For Goal Achievement: 03/08/21 Potential to Achieve Goals: Fair    Frequency BID     Co-evaluation               AM-PAC PT "6 Clicks" Mobility  Outcome Measure Help needed turning from your back to your side while in a flat bed without using bedrails?: A Little Help needed moving from lying on your back to sitting on the side of a flat bed without using bedrails?: A Lot Help needed moving to and from a bed to a chair (including a wheelchair)?: A Lot Help needed standing up from a chair using your arms (e.g., wheelchair or bedside chair)?: A Lot Help needed to walk in hospital room?: A Lot Help needed climbing 3-5 steps with a railing? : Total 6 Click Score: 12    End of Session Equipment Utilized During  Treatment: Gait belt Activity Tolerance: Patient limited by fatigue;Patient limited by pain Patient left: in chair;with call bell/phone within reach (ice pack applied to left hip) Nurse Communication: Mobility status PT Visit Diagnosis: Muscle weakness (generalized) (M62.81);Other abnormalities of gait and mobility (R26.89);Pain Pain - Right/Left: Left Pain - part of body: Hip    Time: 1610-9604 PT Time Calculation (min) (ACUTE ONLY): 21 min   Charges:   PT Evaluation $PT Eval Low Complexity: 1 Low PT Treatments $Therapeutic Exercise: 8-22 mins      Minna Merritts, PT, MPT   Percell Locus 02/22/2021, 1:32  PM

## 2021-02-22 NOTE — Evaluation (Signed)
Occupational Therapy Evaluation Patient Details Name: Jamie Brennan MRN: [id number] DOB: January 18, 1931 Today's Date: 02/22/2021   History of Present Illness Patient is a 88 year old male who fell and suffered a left intertrochanteric hip fracture s/p IM nail. Past medical history significant for prostate cancer, stage IIIa chronic kidney disease, and duodenitis   Clinical Impression   Pt seen for OT evaluation this date. Upon arrival to room, pt awake and seated upright in recliner. Pt oriented to self, place (intermittently), and situation. Pt agreeable to OT eval/tx. Patient reports he is visiting from out of state and is staying here with his niece [name]) where he can stay on the main floor with bedroom/bathroom. Patient stated that his goal is to return to living in his home in [place] (a multilevel home with 10 steps to enter with railing on L side). Pt is a questionable historian; per chart review, pt was receiving outpatient PT services in [place] in Nov/Dec 2022 following a fall. No family present at time of eval; plan to confirm PLOF/home set-up with family when able.   Pt currently requires MAX A for LB dressing and SUPERVISION/SET-UP for seated grooming tasks. During session, pt vocalized urge for BM. Pt required MAX A for stand pivot transfer to/from [facility] and MIN GUARD for seated posterior peri-care, however was unable to void. Pt currently presents with increased pain, decreased balance, and decreased activity tolerance. Pt would benefit from additional skilled OT services to address these impairments and ultimately maximize return to PLOF and minimize risk of future falls, injury, caregiver burden, and readmission. Upon discharge, recommend SNF.      Recommendations for follow up therapy are one component of a multi-disciplinary discharge planning process, led by the attending physician.  Recommendations may be updated based on patient status, additional functional criteria  and insurance authorization.   Follow Up Recommendations  Skilled nursing-short term rehab (<3 hours/day)    Assistance Recommended at Discharge Intermittent Supervision/Assistance  Patient can return home with the following Two people to help with walking and/or transfers;Two people to help with bathing/dressing/bathroom    Functional Status Assessment  Patient has had a recent decline in their functional status and demonstrates the ability to make significant improvements in function in a reasonable and predictable amount of time.  Equipment Recommendations  Other (comment) (defer to next venue of care)       Precautions / Restrictions Precautions Precautions: Fall  Weight Bearing Restrictions: Yes LLE Weight Bearing: Weight bearing as tolerated      Mobility Bed Mobility               General bed mobility comments: not assessed as patient sitting up on arrival and post session    Transfers Overall transfer level: Needs assistance Equipment used: Rolling walker (2 wheels) Transfers: Sit to/from Stand, Bed to chair/wheelchair/BSC Sit to Stand: Max assist Stand pivot transfers: Max assist         General transfer comment: assistance for lifting and lowering provided. decreased eccentric control. verbal cues for technique and hand placement      Balance Overall balance assessment: Needs assistance, History of Falls Sitting-balance support: No upper extremity supported, Feet supported Sitting balance-Leahy Scale: Good Sitting balance - Comments: good sitting balance reaching within BOS   Standing balance support: Bilateral upper extremity supported, Reliant on assistive device for balance Standing balance-Leahy Scale: Poor Standing balance comment: Requires MOD-MAX A for standing balance during stand pivot transfer  ADL either performed or assessed with clinical judgement   ADL Overall ADL's : Needs assistance/impaired      Grooming: Wash/dry hands;Set up;Sitting               Lower Body Dressing: Moderate assistance;Sitting/lateral leans Lower Body Dressing Details (indicate cue type and reason): able to don/doff R sock, but requires physical assistance to don/doff L sock Toilet Transfer: Maximal assistance;Stand-pivot;BSC/3in1;Rolling walker (2 wheels) Toilet Transfer Details (indicate cue type and reason): Requires cues for sequencing and for safe hand placement with RW use Toileting- Clothing Manipulation and Hygiene: Min guard;Sitting/lateral lean Toileting - Clothing Manipulation Details (indicate cue type and reason): With increased time/effort, performs posterior peri-care with no physical assistance             Vision Ability to See in Adequate Light: 0 Adequate Patient Visual Report: No change from baseline              Pertinent Vitals/Pain Pain Assessment Pain Assessment: Faces Faces Pain Scale: Hurts whole lot Pain Location: left hip during mobility Pain Descriptors / Indicators: Sore, Aching Pain Intervention(s): Monitored during session, Repositioned, Ice applied        Extremity/Trunk Assessment Upper Extremity Assessment Upper Extremity Assessment: Generalized weakness   Lower Extremity Assessment Lower Extremity Assessment: RLE deficits/detail;LLE deficits/detail RLE Deficits / Details: grossly WFL for tasks assessed LLE Deficits / Details: s/p IM nailing. Endorses pain with AROM of left hip       Communication Communication Communication: No difficulties   Cognition Arousal/Alertness: Awake/alert Behavior During Therapy: WFL for tasks assessed/performed Overall Cognitive Status: No family/caregiver present to determine baseline cognitive functioning                                 General Comments: Pt oriented to self, place (intermittently - occassionally saying he is at home), and situation. Pt is pleasant and agreebale throughout although  requires increased processing time                Home Living Family/patient expects to be discharged to:: Private residence Living Arrangements: Alone Available Help at Discharge: Family Type of Home: House                           Additional Comments: Patient reports he is visiting from out of state and is staying here with his niece [name]) where he can stay on the main floor with bedroom/bathroom. Patient stated that his goal is to return to living in his home in [place] (a multilevel home with 10 steps to enter with railing on L side). Pt is a questionable historian; per chart review, pt was receiving outpatient PT services in [place] in Nov/Dec 2022 following a fall.      Prior Functioning/Environment Prior Level of Function : Independent/Modified Independent             Mobility Comments: Independent with functional mobility. Chart review reveals pt has hx of falls ADLs Comments: Independent with ADLs        OT Problem List: Decreased activity tolerance;Impaired balance (sitting and/or standing);Decreased safety awareness;Decreased knowledge of use of DME or AE;Decreased knowledge of precautions;Pain      OT Treatment/Interventions: Self-care/ADL training;Therapeutic exercise;Energy conservation;DME and/or AE instruction;Therapeutic activities;Patient/family education;Balance training    OT Goals(Current goals can be found in the care plan section) Acute Rehab OT Goals Patient Stated Goal: to return to  his home in [place] when safe to do so OT Goal Formulation: With patient Time For Goal Achievement: 03/08/21 Potential to Achieve Goals: Good ADL Goals Pt Will Perform Grooming: with min assist;standing Pt Will Perform Lower Body Dressing: with min assist;with adaptive equipment;sitting/lateral leans Pt Will Transfer to Toilet: with min assist;stand pivot transfer;bedside commode  OT Frequency: Min 2X/week       AM-PAC OT "6 Clicks" Daily  Activity     Outcome Measure Help from another person eating meals?: None Help from another person taking care of personal grooming?: A Little Help from another person toileting, which includes using toliet, bedpan, or urinal?: A Lot Help from another person bathing (including washing, rinsing, drying)?: A Lot Help from another person to put on and taking off regular upper body clothing?: A Little Help from another person to put on and taking off regular lower body clothing?: A Lot 6 Click Score: 16   End of Session Equipment Utilized During Treatment: Gait belt;Rolling walker (2 wheels) Nurse Communication: Mobility status  Activity Tolerance: Patient tolerated treatment well Patient left: in chair;with call bell/phone within reach;with chair alarm set  OT Visit Diagnosis: Unsteadiness on feet (R26.81);History of falling (Z91.81);Pain Pain - Right/Left: Left Pain - part of body: Hip                Time: 1100-1136 OT Time Calculation (min): 36 min Charges:  OT General Charges $OT Visit: 1 Visit OT Evaluation $OT Eval Moderate Complexity: 1 Mod OT Treatments $Self Care/Home Management : 23-37 mins  Fredirick Maudlin, OTR/L [place]

## 2021-02-22 NOTE — NC FL2 (Signed)
[place] LEVEL OF CARE SCREENING TOOL     IDENTIFICATION  Patient Name: Jamie Brennan Birthdate: 1931/01/30 Sex: male Admission Date (Current Location): 02/20/2021  [place] and [place] Number:  Engineering geologist and Address:  [facility], [address], [place], [place] [address]      Provider Number: [id number]  Attending Physician Name and Address:  [name], MD  Relative Name and Phone Number:  Donald Prose [phone] [facility]) Patient living with temporarily. From [place], [place].    Current Level of Care: Hospital Recommended Level of Care: [place], Other (Comment) (CIR) Prior Approval Number:    Date Approved/Denied: 02/22/21 PASRR Number: [id number]  Discharge Plan: SNF    Current Diagnoses: Patient Active Problem List   Diagnosis Date Noted   Closed left hip fracture ([place]) 02/20/2021    Orientation RESPIRATION BLADDER Height & Weight     Self, Time, Situation, Place  O2 Indwelling catheter Weight: 81.6 kg Height:  5\' 10"  (177.8 cm)  BEHAVIORAL SYMPTOMS/MOOD NEUROLOGICAL BOWEL NUTRITION STATUS      Continent Diet  AMBULATORY STATUS COMMUNICATION OF NEEDS Skin   Extensive Assist Verbally [facility]) Surgical wounds                       Personal Care Assistance Level of Assistance  Bathing, Dressing           Functional Limitations Info  Hearing (Has hearing aids but not with him.)   Hearing Info: Impaired      SPECIAL CARE FACTORS FREQUENCY  PT (By licensed PT), OT (By licensed OT)     PT Frequency: 5x/week OT Frequency: 5x/week            Contractures Contractures Info: Not present    Additional Factors Info  Code Status, Allergies Code Status Info: DNR see chart for specific details. Allergies Info: No known Allergies           Current Medications (02/22/2021):  This is the current hospital active medication list Current Facility-Administered Medications   Medication Dose Route Frequency Provider Last Rate Last Admin   0.9 %  sodium chloride infusion   Intravenous PRN Mansy, Jan A, MD 10 mL/hr at 02/21/21 2156 10 mL/hr at 02/21/21 2156   acetaminophen (TYLENOL) tablet 500 mg  500 mg Oral Q6H PRN [name], MD   500 mg at 02/21/21 2202   Chlorhexidine Gluconate Cloth 2 % PADS 6 each  6 each Topical Daily [name], MD   6 each at 02/22/21 0800   feeding supplement (ENSURE SURGERY) liquid 237 mL  237 mL Oral BID BM [name], MD   237 mL at 02/22/21 1605   fentaNYL (SUBLIMAZE) injection 50 mcg  50 mcg Intravenous Once PRN Renee Harder, MD       HYDROcodone-acetaminophen (NORCO/VICODIN) 5-325 MG per tablet 1-2 tablet  1-2 tablet Oral Q6H PRN Renee Harder, MD   1 tablet at 02/22/21 0753   magnesium hydroxide (MILK OF MAGNESIA) suspension 30 mL  30 mL Oral Daily PRN Renee Harder, MD   30 mL at 02/22/21 0753   morphine (PF) 2 MG/ML injection 0.5 mg  0.5 mg Intravenous Q2H PRN Renee Harder, MD       ondansetron [facility]) injection 4 mg  4 mg Intravenous Q4H PRN Renee Harder, MD       traZODone (DESYREL) tablet 25 mg  25 mg Oral QHS PRN Renee Harder, MD  25 mg at 02/20/21 2300     Discharge Medications: Please see discharge summary for a list of discharge medications.  Relevant Imaging Results:  Relevant Lab Results:   Additional Information SS#[id number]; PATIENT from [place], [place]. Has been living with niece since dc from rehab in [place].  [name], RN

## 2021-02-22 NOTE — Progress Notes (Signed)
Initial Nutrition Assessment RD working remotely.   DOCUMENTATION CODES:   Not applicable  INTERVENTION:  - will order Ensure Surgery BID, each supplement provides 330 kcal and 18 grams of protein. - weigh patient today.  - complete NFPE when feasible.    NUTRITION DIAGNOSIS:   Increased nutrient needs related to hip fracture, post-op healing as evidenced by estimated needs.  GOAL:   Patient will meet greater than or equal to 90% of their needs  MONITOR:   PO intake, Supplement acceptance, Labs, Weight trends, Skin  REASON FOR ASSESSMENT:   Consult Hip fracture protocol  ASSESSMENT:   88 y.o. male with medical history of prostate cancer, stage 3 CKD, and duodenitis. He presented to the ED after a fall that resulted from him losing his balance. He had subsequent L hip pain and inability to ambulate.  Patient is POD #1 L IM nail due to L hip fracture.  Diet advanced from NPO to Regular yesterday at 1116 and he ate 100% of lunch and 50% of dinner.  Weight on 2/10 was documented as 180 lb which appears to be a stated weight. PTA the most recently documented weight was 145 lb on 10/03/20 although this appears copied forward from 07/16/19.   No information documented in the edema section of flow sheet this admission.    Labs reviewed; BUN: 35 mg/dl, creatinine: 1.46 mg/dl, Ca: 8.5 mg/dl, GFR: 46 ml/min.  Medications reviewed.    NUTRITION - FOCUSED PHYSICAL EXAM:  RD working remotely.  Diet Order:   Diet Order             Diet regular Room service appropriate? Yes; Fluid consistency: Thin  Diet effective now                   EDUCATION NEEDS:   No education needs have been identified at this time  Skin:  Skin Assessment: Skin Integrity Issues: Skin Integrity Issues:: Incisions Incisions: L hip (2/11)  Last BM:  2/11 (type 6, large amount)  Height:   Ht Readings from Last 1 Encounters:  02/20/21 5\' 10"  (1.778 m)    Weight:   Wt Readings from  Last 1 Encounters:  02/20/21 81.6 kg     BMI:  Body mass index is 25.83 kg/m.   Estimated Nutritional Needs:  Kcal:  1800-2000 kcal Protein:  88-100 grams Fluid:  >/= 2 L/day      Jarome Matin, MS, RD, LDN Inpatient Clinical Dietitian RD pager # available in [place]  After hours/weekend pager # available in [facility]

## 2021-02-22 NOTE — Progress Notes (Signed)
PROGRESS NOTE    Jamie Brennan  MBW:[id number] DOB: [birth year] DOA: 02/20/2021 PCP: System, Provider Not In    Assessment & Plan:   Principal Problem:   Closed left hip fracture ([place])   Closed left hip fracture: secondary to mechanical fall. S/p left intermedullary nail intertrochanteric a per ortho surg. PT/OT consulted. Tylenol, morphine, norco prn for pain   CKDIIIa: Cr is trending down again today. Avoid nephrotoxic meds   Normocytic anemia: w/ component of likely acute blood loss anemia secondary to above surgery. Will transfuse if Hb < 7.0     DVT prophylaxis: SCDs Code Status: DNR Family Communication: discussed pt's care w/ pt's family at bedside and answered their questions  Disposition Plan: depends on PT/OT recs   Level of care: Med-Surg  Status is: Inpatient Remains inpatient appropriate because: severity of illness,      Consultants:  Ortho surg   Procedures:   Antimicrobials:    Subjective: Pt c/o hip pain   Objective: Vitals:   02/21/21 1116 02/21/21 1608 02/21/21 2032 02/22/21 0531  BP: 103/78 132/70 123/80 129/76  Pulse: 90 79 83 81  Resp: 16 16 16 16   Temp: 97.7 F (36.5 C) 98.4 F (36.9 C) 98.5 F (36.9 C) 97.9 F (36.6 C)  TempSrc:      SpO2: 100% 100% 98% 100%  Weight:      Height:        Intake/Output Summary (Last 24 hours) at 02/22/2021 0745 Last data filed at 02/22/2021 0716 Gross per 24 hour  Intake 1347.95 ml  Output 1200 ml  Net 147.95 ml   Filed Weights   02/20/21 1826  Weight: 81.6 kg    Examination:  General exam: Appears calm & comfortable  Respiratory system: clear breath sounds b/l  Cardiovascular system: S1/S2+. No clicks or rubs  Gastrointestinal system: Abd is soft, NT, ND & normal bowel sounds  Central nervous system: alert and oriented. Moves all extremities   Psychiatry: Judgement and insight appears. Appropriate mood and affect     Data Reviewed: I have personally reviewed following labs  and imaging studies  CBC: Recent Labs  Lab 02/20/21 1920 02/21/21 0514  WBC 8.7 9.1  NEUTROABS 7.2  --   HGB 12.2* 9.8*  HCT 37.2* 29.9*  MCV 92.8 92.3  PLT 286 017   Basic Metabolic Panel: Recent Labs  Lab 02/20/21 1920 02/21/21 0514  NA 139 138  K 4.6 4.2  CL 105 106  CO2 24 21*  GLUCOSE 136* 126*  BUN 35* 35*  CREATININE 1.62* 1.46*  CALCIUM 9.0 8.5*   GFR: Estimated Creatinine Clearance: 36.8 mL/min (A) (by C-G formula based on SCr of 1.46 mg/dL (H)). Liver Function Tests: Recent Labs  Lab 02/20/21 1920  AST 28  ALT 30  ALKPHOS 97  BILITOT 0.7  PROT 8.2*  ALBUMIN 4.3   No results for input(s): LIPASE, AMYLASE in the last 168 hours. No results for input(s): AMMONIA in the last 168 hours. Coagulation Profile: Recent Labs  Lab 02/20/21 1920  INR 1.0   Cardiac Enzymes: No results for input(s): CKTOTAL, CKMB, CKMBINDEX, TROPONINI in the last 168 hours. BNP (last 3 results) No results for input(s): PROBNP in the last 8760 hours. HbA1C: No results for input(s): HGBA1C in the last 72 hours. CBG: No results for input(s): GLUCAP in the last 168 hours. Lipid Profile: No results for input(s): CHOL, HDL, LDLCALC, TRIG, CHOLHDL, LDLDIRECT in the last 72 hours. Thyroid Function Tests: No results for input(s):  TSH, T4TOTAL, FREET4, T3FREE, THYROIDAB in the last 72 hours. Anemia Panel: No results for input(s): VITAMINB12, FOLATE, FERRITIN, TIBC, IRON, RETICCTPCT in the last 72 hours. Sepsis Labs: No results for input(s): PROCALCITON, LATICACIDVEN in the last 168 hours.  Recent Results (from the past 240 hour(s))  Resp Panel by RT-PCR (Flu A&B, Covid) Nasopharyngeal Swab     Status: None   Collection Time: 02/20/21  7:20 PM   Specimen: Nasopharyngeal Swab; Nasopharyngeal(NP) swabs in vial transport medium  Result Value Ref Range Status   SARS Coronavirus 2 by RT PCR NEGATIVE NEGATIVE Final    Comment: (NOTE) SARS-CoV-2 target nucleic acids are NOT  DETECTED.  The SARS-CoV-2 RNA is generally detectable in upper respiratory specimens during the acute phase of infection. The lowest concentration of SARS-CoV-2 viral copies this assay can detect is 138 copies/mL. A negative result does not preclude SARS-Cov-2 infection and should not be used as the sole basis for treatment or other patient management decisions. A negative result may occur with  improper specimen collection/handling, submission of specimen other than nasopharyngeal swab, presence of viral mutation(s) within the areas targeted by this assay, and inadequate number of viral copies(<138 copies/mL). A negative result must be combined with clinical observations, patient history, and epidemiological information. The expected result is Negative.  Fact Sheet for Patients:  EntrepreneurPulse.com.au  Fact Sheet for Healthcare Providers:  IncredibleEmployment.be  This test is no t yet approved or cleared by the [place] FDA and  has been authorized for detection and/or diagnosis of SARS-CoV-2 by FDA under an Emergency Use Authorization (EUA). This EUA will remain  in effect (meaning this test can be used) for the duration of the COVID-19 declaration under Section 564(b)(1) of the Act, 21 U.S.C.section 360bbb-3(b)(1), unless the authorization is terminated  or revoked sooner.       Influenza A by PCR NEGATIVE NEGATIVE Final   Influenza B by PCR NEGATIVE NEGATIVE Final    Comment: (NOTE) The Xpert Xpress SARS-CoV-2/FLU/RSV plus assay is intended as an aid in the diagnosis of influenza from Nasopharyngeal swab specimens and should not be used as a sole basis for treatment. Nasal washings and aspirates are unacceptable for Xpert Xpress SARS-CoV-2/FLU/RSV testing.  Fact Sheet for Patients: EntrepreneurPulse.com.au  Fact Sheet for Healthcare Providers: IncredibleEmployment.be  This test is not yet  approved or cleared by the [place] FDA and has been authorized for detection and/or diagnosis of SARS-CoV-2 by FDA under an Emergency Use Authorization (EUA). This EUA will remain in effect (meaning this test can be used) for the duration of the COVID-19 declaration under Section 564(b)(1) of the Act, 21 U.S.C. section 360bbb-3(b)(1), unless the authorization is terminated or revoked.  Performed at [facility], [address] [place], [place] [address]   Surgical PCR screen     Status: None   Collection Time: 02/20/21 11:42 PM   Specimen: Nasal Mucosa; Nasal Swab  Result Value Ref Range Status   MRSA, PCR NEGATIVE NEGATIVE Final   Staphylococcus aureus NEGATIVE NEGATIVE Final    Comment: (NOTE) The Xpert SA Assay (FDA approved for NASAL specimens in patients 48 years of age and older), is one component of a comprehensive surveillance program. It is not intended to diagnose infection nor to guide or monitor treatment. Performed at [facility], [address] [place], [place] [address]          Radiology Studies: CT HEAD WO CONTRAST (5MM)  Result Date: 02/20/2021 CLINICAL DATA:  Neck trauma (Age >=  65y); Head trauma, minor (Age >= 65y) EXAM: CT HEAD WITHOUT CONTRAST CT CERVICAL SPINE WITHOUT CONTRAST TECHNIQUE: Multidetector CT imaging of the head and cervical spine was performed following the standard protocol without intravenous contrast. Multiplanar CT image reconstructions of the cervical spine were also generated. RADIATION DOSE REDUCTION: This exam was performed according to the departmental dose-optimization program which includes automated exposure control, adjustment of the mA and/or kV according to patient size and/or use of iterative reconstruction technique. COMPARISON:  None. FINDINGS: CT HEAD FINDINGS BRAIN: BRAIN Prominence of the lateral ventricles may be related to central predominant atrophy, although a component of normal  pressure/communicating hydrocephalus cannot be excluded. Patchy and confluent areas of decreased attenuation are noted throughout the deep and periventricular white matter of the cerebral hemispheres bilaterally, compatible with chronic microvascular ischemic disease. No evidence of large-territorial acute infarction. No parenchymal hemorrhage. No mass lesion. No extra-axial collection. No mass effect or midline shift. No hydrocephalus. Basilar cisterns are patent. Vascular: No hyperdense vessel. Skull: No acute fracture or focal lesion. Sinuses/Orbits: Paranasal sinuses and mastoid air cells are clear. Bilateral lens replacement. Otherwise the orbits are unremarkable. Other: None. CT CERVICAL SPINE FINDINGS Alignment: Normal. Skull base and vertebrae: Multilevel moderate to severe degenerative changes of the spine. Associated multilevel severe osseous neural foraminal stenosis. No severe osseous central canal stenosis. No acute fracture. No aggressive appearing focal osseous lesion or focal pathologic process. Soft tissues and spinal canal: No prevertebral fluid or swelling. No visible canal hematoma. Upper chest: Unremarkable. Other: None. IMPRESSION: 1. No acute intracranial abnormality. 2. Prominence of the lateral ventricles may be related to central predominant atrophy, although a component of normal pressure/communicating hydrocephalus cannot be excluded. 3. No acute displaced fracture or traumatic listhesis of the cervical spine. 4. Multilevel moderate to severe degenerative changes of the spine. Associated multilevel severe osseous neural foraminal stenosis. Electronically Signed   By: Iven Finn M.D.   On: 02/20/2021 20:23   CT Cervical Spine Wo Contrast  Result Date: 02/20/2021 CLINICAL DATA:  Neck trauma (Age >= 65y); Head trauma, minor (Age >= 65y) EXAM: CT HEAD WITHOUT CONTRAST CT CERVICAL SPINE WITHOUT CONTRAST TECHNIQUE: Multidetector CT imaging of the head and cervical spine was performed  following the standard protocol without intravenous contrast. Multiplanar CT image reconstructions of the cervical spine were also generated. RADIATION DOSE REDUCTION: This exam was performed according to the departmental dose-optimization program which includes automated exposure control, adjustment of the mA and/or kV according to patient size and/or use of iterative reconstruction technique. COMPARISON:  None. FINDINGS: CT HEAD FINDINGS BRAIN: BRAIN Prominence of the lateral ventricles may be related to central predominant atrophy, although a component of normal pressure/communicating hydrocephalus cannot be excluded. Patchy and confluent areas of decreased attenuation are noted throughout the deep and periventricular white matter of the cerebral hemispheres bilaterally, compatible with chronic microvascular ischemic disease. No evidence of large-territorial acute infarction. No parenchymal hemorrhage. No mass lesion. No extra-axial collection. No mass effect or midline shift. No hydrocephalus. Basilar cisterns are patent. Vascular: No hyperdense vessel. Skull: No acute fracture or focal lesion. Sinuses/Orbits: Paranasal sinuses and mastoid air cells are clear. Bilateral lens replacement. Otherwise the orbits are unremarkable. Other: None. CT CERVICAL SPINE FINDINGS Alignment: Normal. Skull base and vertebrae: Multilevel moderate to severe degenerative changes of the spine. Associated multilevel severe osseous neural foraminal stenosis. No severe osseous central canal stenosis. No acute fracture. No aggressive appearing focal osseous lesion or focal pathologic process. Soft tissues and spinal canal: No prevertebral  fluid or swelling. No visible canal hematoma. Upper chest: Unremarkable. Other: None. IMPRESSION: 1. No acute intracranial abnormality. 2. Prominence of the lateral ventricles may be related to central predominant atrophy, although a component of normal pressure/communicating hydrocephalus cannot be  excluded. 3. No acute displaced fracture or traumatic listhesis of the cervical spine. 4. Multilevel moderate to severe degenerative changes of the spine. Associated multilevel severe osseous neural foraminal stenosis. Electronically Signed   By: Iven Finn M.D.   On: 02/20/2021 20:23   CT PELVIS WO CONTRAST  Result Date: 02/20/2021 CLINICAL DATA:  Fall, left hip pain/fracture EXAM: CT PELVIS WITHOUT CONTRAST TECHNIQUE: Multidetector CT imaging of the pelvis was performed following the standard protocol without intravenous contrast. RADIATION DOSE REDUCTION: This exam was performed according to the departmental dose-optimization program which includes automated exposure control, adjustment of the mA and/or kV according to patient size and/or use of iterative reconstruction technique. COMPARISON:  Left hip radiographs dated 02/20/2021 FINDINGS: Urinary Tract:  Bladder is within normal limits. Bowel:  Visualized bowel is unremarkable. Vascular/Lymphatic: No evidence of aneurysm. Atherosclerotic calcifications. No suspicious pelvic lymphadenopathy. Reproductive:  Status post prostatectomy. Other:  No pelvic ascites. Musculoskeletal: Intertrochanteric left hip fracture, mildly comminuted, but without significant displacement. No associated large fluid collection/hematoma. No pelvic fracture is seen. Mild degenerative changes of the lower lumbar spine with fusion at L4-5. IMPRESSION: Intertrochanteric left hip fracture. No pelvic fracture is seen. Electronically Signed   By: Julian Hy M.D.   On: 02/20/2021 20:21   DG Chest Portable 1 View  Result Date: 02/20/2021 CLINICAL DATA:  Fall EXAM: PORTABLE CHEST 1 VIEW COMPARISON:  None. FINDINGS: Heart size is normal. Tortuosity of the aorta is noted. The lungs are clear. The vascularity is normal. No effusion. Skin fold present on the right. No acute bone finding. IMPRESSION: No active disease. Electronically Signed   By: Nelson Chimes M.D.   On: 02/20/2021  19:15   DG C-Arm 1-60 Min-No Report  Result Date: 02/21/2021 Fluoroscopy was utilized by the requesting physician.  No radiographic interpretation.   DG HIP UNILAT WITH PELVIS 2-3 VIEWS LEFT  Result Date: 02/21/2021 CLINICAL DATA:  Left hip femoral nail EXAM: DG HIP (WITH OR WITHOUT PELVIS) 2-3V LEFT COMPARISON:  CT pelvis dated 02/20/2021 Radiation Exposure Index (as provided by the fluoroscopic device): 5.2 mGy Kerma FINDINGS: Intraoperative fluoroscopic radiographs during IM nail with dynamic hip screw fixation of an intertrochanteric left hip fracture. IMPRESSION: Status post ORIF of the left hip, as above. Electronically Signed   By: Julian Hy M.D.   On: 02/21/2021 19:28   DG Hip Unilat W or Wo Pelvis 2-3 Views Left  Result Date: 02/20/2021 CLINICAL DATA:  Fall. EXAM: DG HIP (WITH OR WITHOUT PELVIS) 2-3V LEFT COMPARISON:  None. FINDINGS: Mildly displaced and comminuted appearing intertrochanteric fracture of the left femoral neck. The bones are osteopenic. No dislocation. The soft tissues are unremarkable. Multiple surgical clips noted over the pelvis. IMPRESSION: Mildly displaced and comminuted intertrochanteric fracture of the left femoral neck. Electronically Signed   By: Anner Crete M.D.   On: 02/20/2021 19:14        Scheduled Meds:  Chlorhexidine Gluconate Cloth  6 each Topical Daily   pneumococcal 23 valent vaccine  0.5 mL Intramuscular Tomorrow-1000   Continuous Infusions:  sodium chloride 10 mL/hr (02/21/21 2156)     LOS: 2 days    Time spent: 25 mins     [name], MD [facility] Pager [phone]  If  7PM-7AM, please contact night-coverage 02/22/2021, 7:45 AM

## 2021-02-22 NOTE — Plan of Care (Signed)

## 2021-02-23 ENCOUNTER — Encounter: Payer: Self-pay | Admitting: Orthopaedic Surgery

## 2021-02-23 LAB — BASIC METABOLIC PANEL
Anion gap: 10 (ref 5–15)
BUN: 53 mg/dL — ABNORMAL HIGH (ref 8–23)
CO2: 22 mmol/L (ref 22–32)
Calcium: 8.2 mg/dL — ABNORMAL LOW (ref 8.9–10.3)
Chloride: 102 mmol/L (ref 98–111)
Creatinine, Ser: 1.7 mg/dL — ABNORMAL HIGH (ref 0.61–1.24)
GFR, Estimated: 39 mL/min — ABNORMAL LOW (ref >60)
Glucose, Bld: 112 mg/dL — ABNORMAL HIGH (ref 70–99)
Potassium: 4.2 mmol/L (ref 3.5–5.1)
Sodium: 134 mmol/L — ABNORMAL LOW (ref 135–145)

## 2021-02-23 LAB — CBC
HCT: 24.5 % — ABNORMAL LOW (ref 39.0–52.0)
Hemoglobin: 7.9 g/dL — ABNORMAL LOW (ref 13.0–17.0)
MCH: 29.8 pg (ref 26.0–34.0)
MCHC: 32.2 g/dL (ref 30.0–36.0)
MCV: 92.5 fL (ref 80.0–100.0)
Platelets: 214 10*3/uL (ref 150–400)
RBC: 2.65 MIL/uL — ABNORMAL LOW (ref 4.22–5.81)
RDW: 12.4 % (ref 11.5–15.5)
WBC: 9.6 10*3/uL (ref 4.0–10.5)
nRBC: 0 % (ref 0.0–0.2)

## 2021-02-23 MED ORDER — DOCUSATE SODIUM 100 MG PO CAPS
200.0000 mg | ORAL_CAPSULE | Freq: Two times a day (BID) | ORAL | Status: DC
  Administered 2021-02-23 – 2021-02-24 (×2): 200 mg via ORAL
  Filled 2021-02-23 (×4): qty 2

## 2021-02-23 MED ORDER — POLYETHYLENE GLYCOL 3350 17 G PO PACK: 17.0000 g | PACK | Freq: Every day | ORAL | Status: DC | PRN

## 2021-02-23 NOTE — Progress Notes (Signed)
PROGRESS NOTE    Jamie Brennan  HGD:[id number] DOB: [birth year] DOA: 02/20/2021 PCP: System, Provider Not In    Assessment & Plan:   Principal Problem:   Closed left hip fracture ([place])   Closed left hip fracture: secondary to mechanical fall. S/p left intermedullary nail intertrochanteric a per ortho surg. PT/OT recs SNF. CM is working on SNF placement. Tylenol, norco prn for pain   CKDIIIa: Cr is labile. Avoid nephrotoxic meds   Normocytic anemia: w/ component of acute blood loss anemia likely secondary to above surg. Will transfuse if Hb < 7.0     DVT prophylaxis: SCDs Code Status: DNR Family Communication: discussed pt's care w/ pt's niece, [name] and answered their questions  Disposition Plan: depends on PT/OT recs   Level of care: Med-Surg  Status is: Inpatient Remains inpatient appropriate because: severity of illness,      Consultants:  Ortho surg   Procedures:   Antimicrobials:    Subjective: Pt c/o hip pain especially when doing therapy   Objective: Vitals:   02/22/21 2051 02/23/21 0500 02/23/21 0628 02/23/21 0743  BP: 120/77  113/76 128/79  Pulse: 97  91 98  Resp: 16  16 17   Temp: 98.3 F (36.8 C)  98.5 F (36.9 C) 98.6 F (37 C)  TempSrc:      SpO2: 99%  97% 97%  Weight:  63.6 kg    Height:        Intake/Output Summary (Last 24 hours) at 02/23/2021 0759 Last data filed at 02/22/2021 1851 Gross per 24 hour  Intake 669.34 ml  Output 850 ml  Net -180.66 ml   Filed Weights   02/20/21 1826 02/23/21 0500  Weight: 81.6 kg 63.6 kg    Examination:  General exam: Appears comfortable. Hard of hearing  Respiratory system: clear breath sounds b/l  Cardiovascular system: S1 & S2+. No gallops or rubs Gastrointestinal system: abd is soft, NT, ND & hypoactive bowel sounds  Central nervous system: alert and oriented. Moves all extremities   Psychiatry: Judgement and insight appears normal. Flat mood and affect     Data Reviewed: I have  personally reviewed following labs and imaging studies  CBC: Recent Labs  Lab 02/20/21 1920 02/21/21 0514 02/23/21 0605  WBC 8.7 9.1 9.6  NEUTROABS 7.2  --   --   HGB 12.2* 9.8* 7.9*  HCT 37.2* 29.9* 24.5*  MCV 92.8 92.3 92.5  PLT 286 238 962   Basic Metabolic Panel: Recent Labs  Lab 02/20/21 1920 02/21/21 0514 02/23/21 0605  NA 139 138 134*  K 4.6 4.2 4.2  CL 105 106 102  CO2 24 21* 22  GLUCOSE 136* 126* 112*  BUN 35* 35* 53*  CREATININE 1.62* 1.46* 1.70*  CALCIUM 9.0 8.5* 8.2*   GFR: Estimated Creatinine Clearance: 27.5 mL/min (A) (by C-G formula based on SCr of 1.7 mg/dL (H)). Liver Function Tests: Recent Labs  Lab 02/20/21 1920  AST 28  ALT 30  ALKPHOS 97  BILITOT 0.7  PROT 8.2*  ALBUMIN 4.3   No results for input(s): LIPASE, AMYLASE in the last 168 hours. No results for input(s): AMMONIA in the last 168 hours. Coagulation Profile: Recent Labs  Lab 02/20/21 1920  INR 1.0   Cardiac Enzymes: No results for input(s): CKTOTAL, CKMB, CKMBINDEX, TROPONINI in the last 168 hours. BNP (last 3 results) No results for input(s): PROBNP in the last 8760 hours. HbA1C: No results for input(s): HGBA1C in the last 72 hours. CBG: No results for  input(s): GLUCAP in the last 168 hours. Lipid Profile: No results for input(s): CHOL, HDL, LDLCALC, TRIG, CHOLHDL, LDLDIRECT in the last 72 hours. Thyroid Function Tests: No results for input(s): TSH, T4TOTAL, FREET4, T3FREE, THYROIDAB in the last 72 hours. Anemia Panel: No results for input(s): VITAMINB12, FOLATE, FERRITIN, TIBC, IRON, RETICCTPCT in the last 72 hours. Sepsis Labs: No results for input(s): PROCALCITON, LATICACIDVEN in the last 168 hours.  Recent Results (from the past 240 hour(s))  Resp Panel by RT-PCR (Flu A&B, Covid) Nasopharyngeal Swab     Status: None   Collection Time: 02/20/21  7:20 PM   Specimen: Nasopharyngeal Swab; Nasopharyngeal(NP) swabs in vial transport medium  Result Value Ref Range  Status   SARS Coronavirus 2 by RT PCR NEGATIVE NEGATIVE Final    Comment: (NOTE) SARS-CoV-2 target nucleic acids are NOT DETECTED.  The SARS-CoV-2 RNA is generally detectable in upper respiratory specimens during the acute phase of infection. The lowest concentration of SARS-CoV-2 viral copies this assay can detect is 138 copies/mL. A negative result does not preclude SARS-Cov-2 infection and should not be used as the sole basis for treatment or other patient management decisions. A negative result may occur with  improper specimen collection/handling, submission of specimen other than nasopharyngeal swab, presence of viral mutation(s) within the areas targeted by this assay, and inadequate number of viral copies(<138 copies/mL). A negative result must be combined with clinical observations, patient history, and epidemiological information. The expected result is Negative.  Fact Sheet for Patients:  EntrepreneurPulse.com.au  Fact Sheet for Healthcare Providers:  IncredibleEmployment.be  This test is no t yet approved or cleared by the [place] FDA and  has been authorized for detection and/or diagnosis of SARS-CoV-2 by FDA under an Emergency Use Authorization (EUA). This EUA will remain  in effect (meaning this test can be used) for the duration of the COVID-19 declaration under Section 564(b)(1) of the Act, 21 U.S.C.section 360bbb-3(b)(1), unless the authorization is terminated  or revoked sooner.       Influenza A by PCR NEGATIVE NEGATIVE Final   Influenza B by PCR NEGATIVE NEGATIVE Final    Comment: (NOTE) The Xpert Xpress SARS-CoV-2/FLU/RSV plus assay is intended as an aid in the diagnosis of influenza from Nasopharyngeal swab specimens and should not be used as a sole basis for treatment. Nasal washings and aspirates are unacceptable for Xpert Xpress SARS-CoV-2/FLU/RSV testing.  Fact Sheet for  Patients: EntrepreneurPulse.com.au  Fact Sheet for Healthcare Providers: IncredibleEmployment.be  This test is not yet approved or cleared by the [place] FDA and has been authorized for detection and/or diagnosis of SARS-CoV-2 by FDA under an Emergency Use Authorization (EUA). This EUA will remain in effect (meaning this test can be used) for the duration of the COVID-19 declaration under Section 564(b)(1) of the Act, 21 U.S.C. section 360bbb-3(b)(1), unless the authorization is terminated or revoked.  Performed at [facility], [address] [place], [place] [address]   Surgical PCR screen     Status: None   Collection Time: 02/20/21 11:42 PM   Specimen: Nasal Mucosa; Nasal Swab  Result Value Ref Range Status   MRSA, PCR NEGATIVE NEGATIVE Final   Staphylococcus aureus NEGATIVE NEGATIVE Final    Comment: (NOTE) The Xpert SA Assay (FDA approved for NASAL specimens in patients 59 years of age and older), is one component of a comprehensive surveillance program. It is not intended to diagnose infection nor to guide or monitor treatment. Performed at [facility], [address] [place], [place] [address]          Radiology Studies: DG C-Arm 1-60 Min-No Report  Result Date: 02/21/2021 Fluoroscopy was utilized by the requesting physician.  No radiographic interpretation.   DG HIP UNILAT WITH PELVIS 2-3 VIEWS LEFT  Result Date: 02/21/2021 CLINICAL DATA:  Left hip femoral nail EXAM: DG HIP (WITH OR WITHOUT PELVIS) 2-3V LEFT COMPARISON:  CT pelvis dated 02/20/2021 Radiation Exposure Index (as provided by the fluoroscopic device): 5.2 mGy Kerma FINDINGS: Intraoperative fluoroscopic radiographs during IM nail with dynamic hip screw fixation of an intertrochanteric left hip fracture. IMPRESSION: Status post ORIF of the left hip, as above. Electronically Signed   By: Julian Hy M.D.   On: 02/21/2021 19:28         Scheduled Meds:  Chlorhexidine Gluconate Cloth  6 each Topical Daily   feeding supplement  237 mL Oral BID BM   Continuous Infusions:  sodium chloride 10 mL/hr (02/21/21 2156)     LOS: 3 days    Time spent: 20 mins     [name], MD [facility] Pager [phone]  If 7PM-7AM, please contact night-coverage 02/23/2021, 7:59 AM

## 2021-02-23 NOTE — TOC Progression Note (Addendum)
Transition of Care [facility]) - Progression Note    Patient Details  Name: GRYFFIN ALTICE MRN: [id number] Date of Birth: 05/27/1931  Transition of Care [facility]) CM/SW [place], RN Phone Number: 02/23/2021, 1:39 PM  Clinical Narrative: Spoke with Juanell [place] about SNF options, [facility] and [facility], she will do research and let us know tomorrow. Attending notified.   2:02pm: Niece decided to proceed with [facility], called and left voice message to confirm. [employer] authorization started.    Expected Discharge Plan: Skilled Nursing Facility Barriers to Discharge: Continued Medical Work up  Expected Discharge Plan and Services Expected Discharge Plan: [place]   Discharge Planning Services: CM Consult   Living arrangements for the past 2 months:  (with Niece past STR in [place] after a fall in August 2022)                                       Social Determinants of Health (SDOH) Interventions    Readmission Risk Interventions No flowsheet data found.

## 2021-02-23 NOTE — Progress Notes (Addendum)
Physical Therapy Treatment Patient Details Name: Jamie Brennan MRN: [id number] DOB: 1931-07-11 Today's Date: 02/23/2021   History of Present Illness Patient is a 88 year old male who fell and suffered a left intertrochanteric hip fracture s/p IM nail. Past medical history significant for prostate cancer, stage IIIa chronic kidney disease, and duodenitis    PT Comments    Pre medicated before session.  Pt on commode upon arrival with nursing staff.  Difficulty to transfer and needing assist.  Stood with max a x 2 for care and was able to remain standing for task.  Very difficult for pt to step to transfer.  While turning, pt sits without warning on bed.  Short rest given and he stands back up but has difficulty getting to recliner with walker.  After short seated rest, walker removed and squat pivot to recliner at bedside.  Remained up with niece in attendance.   Recommendations for follow up therapy are one component of a multi-disciplinary discharge planning process, led by the attending physician.  Recommendations may be updated based on patient status, additional functional criteria and insurance authorization.  Follow Up Recommendations  Skilled nursing-short term rehab (<3 hours/day)     Assistance Recommended at Discharge Frequent or constant Supervision/Assistance  Patient can return home with the following A lot of help with walking and/or transfers;A lot of help with bathing/dressing/bathroom;Assist for transportation;Help with stairs or ramp for entrance;Two people to help with walking and/or transfers;Two people to help with bathing/dressing/bathroom   Equipment Recommendations  Rolling walker (2 wheels)    Recommendations for Other Services       Precautions / Restrictions Precautions Precautions: Fall Restrictions Weight Bearing Restrictions: Yes LLE Weight Bearing: Weight bearing as tolerated     Mobility  Bed Mobility               General bed mobility  comments: on commode with nursing upon arrival    Transfers Overall transfer level: Needs assistance Equipment used: Rolling walker (2 wheels), None Transfers: Sit to/from Stand, Bed to chair/wheelchair/BSC Sit to Stand: Max assist Stand pivot transfers: Max assist, +2 physical assistance Step pivot transfers: Max assist       General transfer comment: poor control and sits quickly upon turning to bed.    Ambulation/Gait Ambulation/Gait assistance: Max assist, +2 physical assistance Gait Distance (Feet): 2 Feet Assistive device: Rollator (4 wheels) Gait Pattern/deviations: Step-to pattern Gait velocity: decreased     General Gait Details: unable to progress to walking due to limited standing tolerance   Stairs             Wheelchair Mobility    Modified Rankin (Stroke Patients Only)       Balance Overall balance assessment: Needs assistance, History of Falls Sitting-balance support: No upper extremity supported, Feet supported Sitting balance-Leahy Scale: Good     Standing balance support: Bilateral upper extremity supported, Reliant on assistive device for balance Standing balance-Leahy Scale: Poor                              Cognition Arousal/Alertness: Awake/alert Behavior During Therapy: WFL for tasks assessed/performed Overall Cognitive Status: Within Functional Limits for tasks assessed                                          Exercises Other Exercises Other Exercises:  verbal cues for hand placement and transfers    General Comments        Pertinent Vitals/Pain Pain Assessment Pain Assessment: Faces Faces Pain Scale: Hurts even more Pain Location: left hip during mobility Pain Descriptors / Indicators: Sore Pain Intervention(s): Premedicated before session, Repositioned, Limited activity within patient's tolerance    Home Living                          Prior Function            PT Goals  (current goals can now be found in the care plan section) Progress towards PT goals: Progressing toward goals    Frequency    BID      PT Plan Current plan remains appropriate    Co-evaluation              AM-PAC PT "6 Clicks" Mobility   Outcome Measure  Help needed turning from your back to your side while in a flat bed without using bedrails?: A Little Help needed moving from lying on your back to sitting on the side of a flat bed without using bedrails?: A Lot Help needed moving to and from a bed to a chair (including a wheelchair)?: Total Help needed standing up from a chair using your arms (e.g., wheelchair or bedside chair)?: Total Help needed to walk in hospital room?: Total Help needed climbing 3-5 steps with a railing? : Total 6 Click Score: 9    End of Session Equipment Utilized During Treatment: Gait belt Activity Tolerance: Patient limited by fatigue;Patient limited by pain Patient left: in chair;with call bell/phone within reach;with family/visitor present;with chair alarm set Nurse Communication: Mobility status PT Visit Diagnosis: Muscle weakness (generalized) (M62.81);Other abnormalities of gait and mobility (R26.89);Pain Pain - Right/Left: Left Pain - part of body: Hip     Time: 0109-0121 PT Time Calculation (min) (ACUTE ONLY): 12 min  Charges:  $Therapeutic Activity: 8-22 mins                    Chesley Noon, PTA 02/23/21, 2:43 PM

## 2021-02-23 NOTE — Progress Notes (Signed)
Nutrition Follow-up  DOCUMENTATION CODES:   Not applicable  INTERVENTION:   -D/c Ensure Surgery -MVI with minerals daily -Ensure Enlive po BID, each supplement provides 350 kcal and 20 grams of protein.   NUTRITION DIAGNOSIS:   Increased nutrient needs related to hip fracture, post-op healing as evidenced by estimated needs.  Ongoing  GOAL:   Patient will meet greater than or equal to 90% of their needs  Progressing   MONITOR:   PO intake, Supplement acceptance, Labs, Weight trends, Skin  REASON FOR ASSESSMENT:   Consult Hip fracture protocol  ASSESSMENT:   88 y.o. male with medical history of prostate cancer, stage 3 CKD, and duodenitis. He presented to the ED after a fall that resulted from him losing his balance. He had subsequent L hip pain and inability to ambulate.  2/11- s/p PROCEDURE:  LEFT INTRAMEDULLARY (IM) NAIL INTERTROCHANTRIC  Reviewed I/O's: -55 ml x 24 hours and -33 ml since admission  UOP: 850 ml x 24 hours   Pt sitting up in recliner chair at time of visit. He reports feeling better today and continues to make progress with therapy "one day at a time". Pt reports good appetite and has been eating most of his meals here. Noted meal completions 50-100%.   PTA pt reports good appetite. He consumes 3 meals per day (Breakfast: eggs and toast; Lunch and Dinner: meat, starch, and vegetable). Pt pt, he is "not a snacker" and "tries not to eat sweets".   Pt denies any weight loss. Reviewed wt jt' wt has been stable over the past 5 months.   Discussed importance of good meal and supplement intake to promote healing.   Per pt, plan to d/c to SNF tomorrow.   Medications reviewed and include colace.   Labs reviewed: Na: 134.    NUTRITION - FOCUSED PHYSICAL EXAM:  Flowsheet Row Most Recent Value  Orbital Region No depletion  Upper Arm Region Mild depletion  Thoracic and Lumbar Region No depletion  Buccal Region No depletion  Temple Region No  depletion  Clavicle Bone Region Mild depletion  Clavicle and Acromion Bone Region Mild depletion  Scapular Bone Region Mild depletion  Dorsal Hand Mild depletion  Patellar Region Mild depletion  Anterior Thigh Region Mild depletion  Posterior Calf Region Mild depletion  Edema (RD Assessment) None  Hair Reviewed  Eyes Reviewed  Mouth Reviewed  Skin Reviewed  Nails Reviewed       Diet Order:   Diet Order             Diet regular Room service appropriate? Yes; Fluid consistency: Thin  Diet effective now                   EDUCATION NEEDS:   Education needs have been addressed  Skin:  Skin Assessment: Skin Integrity Issues: Skin Integrity Issues:: Incisions Incisions: L hip (2/11)  Last BM:  02/23/21  Height:   Ht Readings from Last 1 Encounters:  02/20/21 5\' 10"  (1.778 m)    Weight:   Wt Readings from Last 1 Encounters:  02/23/21 63.6 kg    Ideal Body Weight:  75.45 kg  BMI:  Body mass index is 20.12 kg/m.  Estimated Nutritional Needs:   Kcal:  1700-1900  Protein:  85-100 grams  Fluid:  > 1.7 L    Loistine Chance, RD, LDN, [place] Registered Dietitian II Certified Diabetes Care and Education Specialist Please refer to [facility] for RD and/or RD on-call/weekend/after hours pager

## 2021-02-23 NOTE — Progress Notes (Signed)
°  Subjective:  Patient reports pain as improved as she was able to sleep better.  He had discomfort with standing at the bed with PT yesterday.  Objective:   VITALS:   Vitals:   02/22/21 1541 02/22/21 2051 02/23/21 0500 02/23/21 0628  BP: 128/80 120/77  113/76  Pulse: 79 97  91  Resp: 16 16  16   Temp: 98.5 F (36.9 C) 98.3 F (36.8 C)  98.5 F (36.9 C)  TempSrc:      SpO2: 100% 99%  97%  Weight:   63.6 kg   Height:        PHYSICAL EXAM:  General: Alert, comfortable Left lower extremity: Proximal dressing with mild saturation, appears dried, remainder of dressings are clean dry and intact, left lower extremity is neurovascular intact  LABS  Results for orders placed or performed during the hospital encounter of 02/20/21 (from the past 24 hour(s))  CBC     Status: Abnormal   Collection Time: 02/23/21  6:05 AM  Result Value Ref Range   WBC 9.6 4.0 - 10.5 K/uL   RBC 2.65 (L) 4.22 - 5.81 MIL/uL   Hemoglobin 7.9 (L) 13.0 - 17.0 g/dL   HCT 24.5 (L) 39.0 - 52.0 %   MCV 92.5 80.0 - 100.0 fL   MCH 29.8 26.0 - 34.0 pg   MCHC 32.2 30.0 - 36.0 g/dL   RDW 12.4 11.5 - 15.5 %   Platelets 214 150 - 400 K/uL   nRBC 0.0 0.0 - 0.2 %  Basic metabolic panel     Status: Abnormal   Collection Time: 02/23/21  6:05 AM  Result Value Ref Range   Sodium 134 (L) 135 - 145 mmol/L   Potassium 4.2 3.5 - 5.1 mmol/L   Chloride 102 98 - 111 mmol/L   CO2 22 22 - 32 mmol/L   Glucose, Bld 112 (H) 70 - 99 mg/dL   BUN 53 (H) 8 - 23 mg/dL   Creatinine, Ser 1.70 (H) 0.61 - 1.24 mg/dL   Calcium 8.2 (L) 8.9 - 10.3 mg/dL   GFR, Estimated 39 (L) >60 mL/min   Anion gap 10 5 - 15    DG C-Arm 1-60 Min-No Report  Result Date: 02/21/2021 Fluoroscopy was utilized by the requesting physician.  No radiographic interpretation.   DG HIP UNILAT WITH PELVIS 2-3 VIEWS LEFT  Result Date: 02/21/2021 CLINICAL DATA:  Left hip femoral nail EXAM: DG HIP (WITH OR WITHOUT PELVIS) 2-3V LEFT COMPARISON:  CT pelvis dated  02/20/2021 Radiation Exposure Index (as provided by the fluoroscopic device): 5.2 mGy Kerma FINDINGS: Intraoperative fluoroscopic radiographs during IM nail with dynamic hip screw fixation of an intertrochanteric left hip fracture. IMPRESSION: Status post ORIF of the left hip, as above. Electronically Signed   By: Julian Hy M.D.   On: 02/21/2021 19:28    Assessment/Plan: 2 Days Post-Op  Status post left hip IM nail  -Appreciate hospitalist team support -PT/OT: Weight-bear as tolerated LLE -DVT prophylaxis: Lovenox x2 weeks -Discharge per PT/hospitalist Case management: SNF -Follow-up in clinic in approximately 2 weeks for x-rays and staple removal   Renee Harder , MD 02/23/2021, 7:42 AM

## 2021-02-23 NOTE — Progress Notes (Signed)
Physical Therapy Treatment Patient Details Name: Jamie Brennan MRN: [id number] DOB: 1931/12/28 Today's Date: 02/23/2021   History of Present Illness Patient is a 88 year old male who fell and suffered a left intertrochanteric hip fracture s/p IM nail. Past medical history significant for prostate cancer, stage IIIa chronic kidney disease, and duodenitis    PT Comments    Ready to return to bed.  Stood with mod a x 2 to walker and max a x 2 to transfer to bed.  Mod a x 2 to return to supine. Participated in exercises as described below.    Recommendations for follow up therapy are one component of a multi-disciplinary discharge planning process, led by the attending physician.  Recommendations may be updated based on patient status, additional functional criteria and insurance authorization.  Follow Up Recommendations  Skilled nursing-short term rehab (<3 hours/day)     Assistance Recommended at Discharge Frequent or constant Supervision/Assistance  Patient can return home with the following A lot of help with walking and/or transfers;A lot of help with bathing/dressing/bathroom;Assist for transportation;Help with stairs or ramp for entrance;Two people to help with walking and/or transfers;Two people to help with bathing/dressing/bathroom   Equipment Recommendations  Rolling walker (2 wheels)    Recommendations for Other Services       Precautions / Restrictions Precautions Precautions: Fall Restrictions Weight Bearing Restrictions: Yes LLE Weight Bearing: Weight bearing as tolerated     Mobility  Bed Mobility Overal bed mobility: Needs Assistance Bed Mobility: Sit to Supine       Sit to supine: Mod assist, +2 for physical assistance   General bed mobility comments: on commode with nursing upon arrival    Transfers Overall transfer level: Needs assistance Equipment used: Rolling walker (2 wheels) Transfers: Sit to/from Stand Sit to Stand: Mod assist, +2 physical  assistance Stand pivot transfers: Max assist, +2 physical assistance Step pivot transfers: Max assist, +2 physical assistance       General transfer comment: poor control and sits quickly upon turning to bed.    Ambulation/Gait Ambulation/Gait assistance: Max assist, +2 physical assistance Gait Distance (Feet): 2 Feet Assistive device: Rolling walker (2 wheels) Gait Pattern/deviations: Step-to pattern Gait velocity: decreased     General Gait Details: very small poor quality steps   Stairs             Wheelchair Mobility    Modified Rankin (Stroke Patients Only)       Balance Overall balance assessment: Needs assistance, History of Falls Sitting-balance support: No upper extremity supported, Feet supported Sitting balance-Leahy Scale: Good     Standing balance support: Bilateral upper extremity supported, Reliant on assistive device for balance Standing balance-Leahy Scale: Poor                              Cognition Arousal/Alertness: Awake/alert Behavior During Therapy: WFL for tasks assessed/performed Overall Cognitive Status: Within Functional Limits for tasks assessed                                          Exercises Other Exercises Other Exercises: supine AAROM x 10    General Comments        Pertinent Vitals/Pain Pain Assessment Pain Assessment: Faces Faces Pain Scale: Hurts even more Pain Location: left hip during mobility Pain Descriptors / Indicators: Sore Pain Intervention(s): Limited  activity within patient's tolerance, Monitored during session, Premedicated before session, Repositioned    Home Living                          Prior Function            PT Goals (current goals can now be found in the care plan section) Progress towards PT goals: Progressing toward goals    Frequency    BID      PT Plan Current plan remains appropriate    Co-evaluation              AM-PAC  PT "6 Clicks" Mobility   Outcome Measure  Help needed turning from your back to your side while in a flat bed without using bedrails?: A Little Help needed moving from lying on your back to sitting on the side of a flat bed without using bedrails?: A Lot Help needed moving to and from a bed to a chair (including a wheelchair)?: Total Help needed standing up from a chair using your arms (e.g., wheelchair or bedside chair)?: Total Help needed to walk in hospital room?: Total Help needed climbing 3-5 steps with a railing? : Total 6 Click Score: 9    End of Session Equipment Utilized During Treatment: Gait belt Activity Tolerance: Patient tolerated treatment well;Patient limited by pain Patient left: in bed;with call bell/phone within reach;with bed alarm set Nurse Communication: Mobility status PT Visit Diagnosis: Muscle weakness (generalized) (M62.81);Other abnormalities of gait and mobility (R26.89);Pain Pain - Right/Left: Left Pain - part of body: Hip     Time: 0315-0326 PT Time Calculation (min) (ACUTE ONLY): 11 min  Charges:  $Therapeutic Exercise: 8-22 mins $Therapeutic Activity: 8-22 mins                    Chesley Noon, PTA 02/23/21, 4:00 PM

## 2021-02-23 NOTE — Anesthesia Postprocedure Evaluation (Signed)
Anesthesia Post Note  Patient: Kylian Loh Maute  Procedure(s) Performed: INTRAMEDULLARY (IM) NAIL INTERTROCHANTRIC (Left: Hip)  Patient location during evaluation: Nursing Unit Anesthesia Type: Spinal Level of consciousness: oriented and awake and alert Pain management: pain level controlled Vital Signs Assessment: post-procedure vital signs reviewed and stable Respiratory status: spontaneous breathing and respiratory function stable Cardiovascular status: blood pressure returned to baseline and stable Postop Assessment: no headache, no backache, no apparent nausea or vomiting and patient able to bend at knees Anesthetic complications: no   No notable events documented.   Last Vitals:  Vitals:   02/22/21 2051 02/23/21 0628  BP: 120/77 113/76  Pulse: 97 91  Resp: 16 16  Temp: 36.8 C 36.9 C  SpO2: 99% 97%    Last Pain:  Vitals:   02/22/21 2131  TempSrc:   PainSc: Asleep                 Alison Stalling

## 2021-02-24 LAB — BASIC METABOLIC PANEL
Anion gap: 9 (ref 5–15)
BUN: 57 mg/dL — ABNORMAL HIGH (ref 8–23)
CO2: 22 mmol/L (ref 22–32)
Calcium: 8.4 mg/dL — ABNORMAL LOW (ref 8.9–10.3)
Chloride: 106 mmol/L (ref 98–111)
Creatinine, Ser: 1.5 mg/dL — ABNORMAL HIGH (ref 0.61–1.24)
GFR, Estimated: 45 mL/min — ABNORMAL LOW (ref >60)
Glucose, Bld: 105 mg/dL — ABNORMAL HIGH (ref 70–99)
Potassium: 4 mmol/L (ref 3.5–5.1)
Sodium: 137 mmol/L (ref 135–145)

## 2021-02-24 LAB — CBC
HCT: 25.6 % — ABNORMAL LOW (ref 39.0–52.0)
Hemoglobin: 8.8 g/dL — ABNORMAL LOW (ref 13.0–17.0)
MCH: 32.2 pg (ref 26.0–34.0)
MCHC: 34.4 g/dL (ref 30.0–36.0)
MCV: 93.8 fL (ref 80.0–100.0)
Platelets: 268 10*3/uL (ref 150–400)
RBC: 2.73 MIL/uL — ABNORMAL LOW (ref 4.22–5.81)
RDW: 12.4 % (ref 11.5–15.5)
WBC: 8.6 10*3/uL (ref 4.0–10.5)
nRBC: 0 % (ref 0.0–0.2)

## 2021-02-24 NOTE — Progress Notes (Signed)
PROGRESS NOTE    Jamie Brennan  SWF:[id number] DOB: 05-27-1931 DOA: 02/20/2021 PCP: System, Provider Not In    Assessment & Plan:   Principal Problem:   Closed left hip fracture ([place])   Closed left hip fracture: secondary to mechanical fall. S/p left intermedullary nail intertrochanteric a per ortho surg.Tylenol, norco prn for pain. PT/OT recs SNF  CKDIIIa: Cr is trending down today. Avoid nephrotoxic meds  Normocytic anemia: w/ component of acute blood loss anemia likely secondary to above surg. Will transfuse if Hb < 7.0      DVT prophylaxis: SCDs Code Status: DNR Family Communication: discussed pt's care w/ pt's niece, [name] and answered her questions  Disposition Plan: likely d/c to SNF. Waiting on insurance auth   Level of care: Med-Surg  Status is: Inpatient Remains inpatient appropriate because: severity of illness,      Consultants:  Ortho surg   Procedures:   Antimicrobials:    Subjective: Pt c/o hip pain but slightly improved from day prior.   Objective: Vitals:   02/23/21 2008 02/24/21 0500 02/24/21 0505 02/24/21 0735  BP: 119/79  114/70 113/80  Pulse: 86  83 79  Resp: 18  16 15   Temp: 98.5 F (36.9 C)  98.3 F (36.8 C) 98.4 F (36.9 C)  TempSrc:      SpO2: 100%  98% 99%  Weight:  64.4 kg    Height:       No intake or output data in the 24 hours ending 02/24/21 0754  Filed Weights   02/20/21 1826 02/23/21 0500 02/24/21 0500  Weight: 81.6 kg 63.6 kg 64.4 kg    Examination:  General exam: Appears calm & comfortable. Hard of hearing  Respiratory system: clear breath sounds b/l  Cardiovascular system: S1/S2+. No rubs or gallops  Gastrointestinal system: Abd is soft, NT, ND & normal bowel sounds  Central nervous system: Alert and oriented. Moves all extremities   Psychiatry: Judgement and insight appears normal. Flat mood and affect     Data Reviewed: I have personally reviewed following labs and imaging  studies  CBC: Recent Labs  Lab 02/20/21 1920 02/21/21 0514 02/23/21 0605 02/24/21 0625  WBC 8.7 9.1 9.6 8.6  NEUTROABS 7.2  --   --   --   HGB 12.2* 9.8* 7.9* 8.8*  HCT 37.2* 29.9* 24.5* 25.6*  MCV 92.8 92.3 92.5 93.8  PLT 286 238 214 220   Basic Metabolic Panel: Recent Labs  Lab 02/20/21 1920 02/21/21 0514 02/23/21 0605 02/24/21 0625  NA 139 138 134* 137  K 4.6 4.2 4.2 4.0  CL 105 106 102 106  CO2 24 21* 22 22  GLUCOSE 136* 126* 112* 105*  BUN 35* 35* 53* 57*  CREATININE 1.62* 1.46* 1.70* 1.50*  CALCIUM 9.0 8.5* 8.2* 8.4*   GFR: Estimated Creatinine Clearance: 31.6 mL/min (A) (by C-G formula based on SCr of 1.5 mg/dL (H)). Liver Function Tests: Recent Labs  Lab 02/20/21 1920  AST 28  ALT 30  ALKPHOS 97  BILITOT 0.7  PROT 8.2*  ALBUMIN 4.3   No results for input(s): LIPASE, AMYLASE in the last 168 hours. No results for input(s): AMMONIA in the last 168 hours. Coagulation Profile: Recent Labs  Lab 02/20/21 1920  INR 1.0   Cardiac Enzymes: No results for input(s): CKTOTAL, CKMB, CKMBINDEX, TROPONINI in the last 168 hours. BNP (last 3 results) No results for input(s): PROBNP in the last 8760 hours. HbA1C: No results for input(s): HGBA1C in the last 72  hours. CBG: No results for input(s): GLUCAP in the last 168 hours. Lipid Profile: No results for input(s): CHOL, HDL, LDLCALC, TRIG, CHOLHDL, LDLDIRECT in the last 72 hours. Thyroid Function Tests: No results for input(s): TSH, T4TOTAL, FREET4, T3FREE, THYROIDAB in the last 72 hours. Anemia Panel: No results for input(s): VITAMINB12, FOLATE, FERRITIN, TIBC, IRON, RETICCTPCT in the last 72 hours. Sepsis Labs: No results for input(s): PROCALCITON, LATICACIDVEN in the last 168 hours.  Recent Results (from the past 240 hour(s))  Resp Panel by RT-PCR (Flu A&B, Covid) Nasopharyngeal Swab     Status: None   Collection Time: 02/20/21  7:20 PM   Specimen: Nasopharyngeal Swab; Nasopharyngeal(NP) swabs in vial  transport medium  Result Value Ref Range Status   SARS Coronavirus 2 by RT PCR NEGATIVE NEGATIVE Final    Comment: (NOTE) SARS-CoV-2 target nucleic acids are NOT DETECTED.  The SARS-CoV-2 RNA is generally detectable in upper respiratory specimens during the acute phase of infection. The lowest concentration of SARS-CoV-2 viral copies this assay can detect is 138 copies/mL. A negative result does not preclude SARS-Cov-2 infection and should not be used as the sole basis for treatment or other patient management decisions. A negative result may occur with  improper specimen collection/handling, submission of specimen other than nasopharyngeal swab, presence of viral mutation(s) within the areas targeted by this assay, and inadequate number of viral copies(<138 copies/mL). A negative result must be combined with clinical observations, patient history, and epidemiological information. The expected result is Negative.  Fact Sheet for Patients:  EntrepreneurPulse.com.au  Fact Sheet for Healthcare Providers:  IncredibleEmployment.be  This test is no t yet approved or cleared by the [place] FDA and  has been authorized for detection and/or diagnosis of SARS-CoV-2 by FDA under an Emergency Use Authorization (EUA). This EUA will remain  in effect (meaning this test can be used) for the duration of the COVID-19 declaration under Section 564(b)(1) of the Act, 21 U.S.C.section 360bbb-3(b)(1), unless the authorization is terminated  or revoked sooner.       Influenza A by PCR NEGATIVE NEGATIVE Final   Influenza B by PCR NEGATIVE NEGATIVE Final    Comment: (NOTE) The Xpert Xpress SARS-CoV-2/FLU/RSV plus assay is intended as an aid in the diagnosis of influenza from Nasopharyngeal swab specimens and should not be used as a sole basis for treatment. Nasal washings and aspirates are unacceptable for Xpert Xpress SARS-CoV-2/FLU/RSV testing.  Fact  Sheet for Patients: EntrepreneurPulse.com.au  Fact Sheet for Healthcare Providers: IncredibleEmployment.be  This test is not yet approved or cleared by the [place] FDA and has been authorized for detection and/or diagnosis of SARS-CoV-2 by FDA under an Emergency Use Authorization (EUA). This EUA will remain in effect (meaning this test can be used) for the duration of the COVID-19 declaration under Section 564(b)(1) of the Act, 21 U.S.C. section 360bbb-3(b)(1), unless the authorization is terminated or revoked.  Performed at [facility], [address], [place], [place] [address]   Surgical PCR screen     Status: None   Collection Time: 02/20/21 11:42 PM   Specimen: Nasal Mucosa; Nasal Swab  Result Value Ref Range Status   MRSA, PCR NEGATIVE NEGATIVE Final   Staphylococcus aureus NEGATIVE NEGATIVE Final    Comment: (NOTE) The Xpert SA Assay (FDA approved for NASAL specimens in patients 93 years of age and older), is one component of a comprehensive surveillance program. It is not intended to diagnose infection nor to guide or monitor treatment. Performed at [facility]  [facility], [address] [place], [place] [address]          Radiology Studies: No results found.      Scheduled Meds:  Chlorhexidine Gluconate Cloth  6 each Topical Daily   docusate sodium  200 mg Oral BID   feeding supplement  237 mL Oral BID BM   Continuous Infusions:  sodium chloride 10 mL/hr (02/21/21 2156)     LOS: 4 days    Time spent: 15 mins     [name], MD [facility] Pager [phone]  If 7PM-7AM, please contact night-coverage 02/24/2021, 7:54 AM

## 2021-02-24 NOTE — TOC Progression Note (Signed)
Transition of Care [facility]) - Progression Note    Patient Details  Name: TREVAN MESSMAN MRN: [id number] Date of Birth: [birth year]  Transition of Care [facility]) CM/SW [place], RN Phone Number: 02/24/2021, 2:27 PM  Clinical Narrative:   Single Case agreement approved [facility] [id number] care coordinator is Horace Porteous Per Arp[facility]; it could 2-3 days before they get the letter to the facility, I called [facility] and notified, she stated that they spoke to April earlier and explained that they need letter in hand prior to DC, If the auth expires will have to call [employer] and update the DC date    Expected Discharge Plan: [place] Barriers to Discharge: Continued Medical Work up  Expected Discharge Plan and Services Expected Discharge Plan: [place]   Discharge Planning Services: CM Consult   Living arrangements for the past 2 months:  (with Niece past STR in [place] after a fall in August 2022)                                       Social Determinants of Health (SDOH) Interventions    Readmission Risk Interventions No flowsheet data found.

## 2021-02-24 NOTE — Progress Notes (Signed)
L hip post op site leaking significant amount of serosanguinous drainage. MD Maren [place] made aware. Reinforced with 3 ABD pads, still leaking through. Patient cleansed and full linen change at this time. Will continue to monitor

## 2021-02-24 NOTE — Progress Notes (Signed)
Physical Therapy Treatment Patient Details Name: Jamie Brennan MRN: [id number] DOB: 1931-11-12 Today's Date: 02/24/2021   History of Present Illness Patient is a 88 year old male who fell and suffered a left intertrochanteric hip fracture s/p IM nail. Past medical history significant for prostate cancer, stage IIIa chronic kidney disease, and duodenitis    PT Comments    Co-tx with OT.  Billed 1 unit each per protocol  To EOB with min a x 2.  Stood EOB with mod a x 2 for pericare. Stood x 3 during session and is able to get to chair at bedside with max a x 2.  Continues with significant difficulty with stepping to chair and fatigues quickly.  For pt and staff safety is +2 at this time.  Discussed with tech. Participated in exercises as described below.  See OT note for OT interventions during session.   Recommendations for follow up therapy are one component of a multi-disciplinary discharge planning process, led by the attending physician.  Recommendations may be updated based on patient status, additional functional criteria and insurance authorization.  Follow Up Recommendations  Skilled nursing-short term rehab (<3 hours/day)     Assistance Recommended at Discharge Frequent or constant Supervision/Assistance  Patient can return home with the following A lot of help with walking and/or transfers;A lot of help with bathing/dressing/bathroom;Assist for transportation;Help with stairs or ramp for entrance;Two people to help with walking and/or transfers;Two people to help with bathing/dressing/bathroom   Equipment Recommendations       Recommendations for Other Services       Precautions / Restrictions Precautions Precautions: Fall Restrictions Weight Bearing Restrictions: Yes LLE Weight Bearing: Weight bearing as tolerated     Mobility  Bed Mobility Overal bed mobility: Needs Assistance Bed Mobility: Supine to Sit     Supine to sit: Min assist, +2 for physical  assistance          Transfers Overall transfer level: Needs assistance Equipment used: Rolling walker (2 wheels) Transfers: Sit to/from Stand Sit to Stand: Mod assist, +2 physical assistance   Step pivot transfers: Max assist, +2 physical assistance            Ambulation/Gait Ambulation/Gait assistance: Max assist, +2 physical assistance Gait Distance (Feet): 2 Feet Assistive device: Rolling walker (2 wheels) Gait Pattern/deviations: Step-to pattern, Decreased step length - right, Decreased step length - left Gait velocity: decreased     General Gait Details: very small poor quality steps   Stairs             Wheelchair Mobility    Modified Rankin (Stroke Patients Only)       Balance Overall balance assessment: Needs assistance Sitting-balance support: Single extremity supported Sitting balance-Leahy Scale: Fair     Standing balance support: Bilateral upper extremity supported Standing balance-Leahy Scale: Poor                              Cognition Arousal/Alertness: Awake/alert Behavior During Therapy: WFL for tasks assessed/performed Overall Cognitive Status: Within Functional Limits for tasks assessed                                          Exercises Other Exercises Other Exercises: supine and seated AROM x 10    General Comments        Pertinent Vitals/Pain Pain  Assessment Pain Assessment: Faces Faces Pain Scale: Hurts little more Pain Descriptors / Indicators: Sore Pain Intervention(s): Limited activity within patient's tolerance, Monitored during session, Premedicated before session    Home Living                          Prior Function            PT Goals (current goals can now be found in the care plan section) Progress towards PT goals: Progressing toward goals    Frequency    BID      PT Plan Current plan remains appropriate    Co-evaluation PT/OT/SLP  Co-Evaluation/Treatment: Yes Reason for Co-Treatment: For patient/therapist safety PT goals addressed during session: Mobility/safety with mobility;Strengthening/ROM OT goals addressed during session: ADL's and self-care      AM-PAC PT "6 Clicks" Mobility   Outcome Measure  Help needed turning from your back to your side while in a flat bed without using bedrails?: A Little Help needed moving from lying on your back to sitting on the side of a flat bed without using bedrails?: A Lot Help needed moving to and from a bed to a chair (including a wheelchair)?: A Lot Help needed standing up from a chair using your arms (e.g., wheelchair or bedside chair)?: A Lot Help needed to walk in hospital room?: A Lot Help needed climbing 3-5 steps with a railing? : Total 6 Click Score: 12    End of Session Equipment Utilized During Treatment: Gait belt Activity Tolerance: Patient tolerated treatment well;Patient limited by pain Patient left: in chair;with call bell/phone within reach;with chair alarm set Nurse Communication: Mobility status PT Visit Diagnosis: Muscle weakness (generalized) (M62.81);Other abnormalities of gait and mobility (R26.89);Pain Pain - Right/Left: Left Pain - part of body: Hip     Time: 6468-0321 PT Time Calculation (min) (ACUTE ONLY): 28 min  Charges:  $Therapeutic Exercise: 8-22 mins                    Chesley Noon, PTA 02/24/21, 9:44 AM

## 2021-02-24 NOTE — Plan of Care (Signed)

## 2021-02-24 NOTE — Plan of Care (Signed)

## 2021-02-24 NOTE — Progress Notes (Signed)
Occupational Therapy Treatment Patient Details Name: Jamie Brennan MRN: [id number] DOB: 1931/01/14 Today's Date: 02/24/2021   History of present illness Patient is a 88 year old male who fell and suffered a left intertrochanteric hip fracture s/p IM nail. Past medical history significant for prostate cancer, stage IIIa chronic kidney disease, and duodenitis   OT comments  Pt seen for OT tx and co-tx with PT to address ADL transfers. Pt endorsing 4/10 L hip pain at rest, increasing to 7/10 with standing/small side steps from EOB to recliner. Pt required MINA  for bed mobility, MOD-MAX A +2 for repeated standing with BUE support on RW and with attempts at pericare in standing with RUE support on the RW. Pt set up with items for brushing his teeth and dentures and wash his face. Pt required MIN A for thoroughness for cleaning dentures. Pt progressing towards goals, continues to benefit from skilled OT services. Continue to recommend SNF prior to returning to home in [place].    Recommendations for follow up therapy are one component of a multi-disciplinary discharge planning process, led by the attending physician.  Recommendations may be updated based on patient status, additional functional criteria and insurance authorization.    Follow Up Recommendations  Skilled nursing-short term rehab (<3 hours/day)    Assistance Recommended at Discharge Intermittent Supervision/Assistance  Patient can return home with the following  Two people to help with walking and/or transfers;Two people to help with bathing/dressing/bathroom;Assistance with cooking/housework;Assist for transportation;Help with stairs or ramp for entrance;Direct supervision/assist for medications management   Equipment Recommendations  BSC/3in1;Other (comment) (defer to next venue; anticipate will benefit from [facility])    Recommendations for Other Services      Precautions / Restrictions Precautions Precautions:  Fall Restrictions Weight Bearing Restrictions: Yes LLE Weight Bearing: Weight bearing as tolerated       Mobility Bed Mobility Overal bed mobility: Independent Bed Mobility: Supine to Sit     Supine to sit: Min assist, +2 for safety/equipment          Transfers Overall transfer level: Needs assistance Equipment used: Rolling walker (2 wheels) Transfers: Sit to/from Stand, Bed to chair/wheelchair/BSC Sit to Stand: Mod assist, +2 physical assistance Stand pivot transfers: Mod assist, Max assist, +2 physical assistance   Step pivot transfers: Max assist, +2 physical assistance, Mod assist           Balance Overall balance assessment: Needs assistance Sitting-balance support: Single extremity supported, Bilateral upper extremity supported, Feet supported Sitting balance-Leahy Scale: Fair Sitting balance - Comments: leans to R side to offweight L hip 2/2 pain   Standing balance support: Bilateral upper extremity supported, Single extremity supported, During functional activity, Reliant on assistive device for balance Standing balance-Leahy Scale: Poor Standing balance comment: requires assist in standing with attempts at pericare with RUE support on RW                           ADL either performed or assessed with clinical judgement   ADL Overall ADL's : Needs assistance/impaired     Grooming: Sitting;Set up;Oral care;Wash/dry face;Minimal assistance Grooming Details (indicate cue type and reason): MIN A for thoroughness in cleaning dentures         Upper Body Dressing : Sitting;Set up                   Functional mobility during ADLs: Moderate assistance;Rolling walker (2 wheels);+2 for physical assistance General ADL Comments: VC for  hand placement, anterior weight shift    Extremity/Trunk Assessment              Vision       Perception     Praxis      Cognition Arousal/Alertness: Awake/alert Behavior During Therapy: WFL for  tasks assessed/performed Overall Cognitive Status: Within Functional Limits for tasks assessed                                          Exercises      Shoulder Instructions       General Comments      Pertinent Vitals/ Pain       Pain Assessment Pain Assessment: 0-10 Pain Score: 7  Pain Location: L hip during mobility, down to a 4/10 at rest Pain Descriptors / Indicators: Aching, Guarding Pain Intervention(s): Limited activity within patient's tolerance, Monitored during session, Premedicated before session, Repositioned  Home Living                                          Prior Functioning/Environment              Frequency  Min 2X/week        Progress Toward Goals  OT Goals(current goals can now be found in the care plan section)  Progress towards OT goals: Progressing toward goals  Acute Rehab OT Goals Patient Stated Goal: to return to his home in [place] when safe to do so OT Goal Formulation: With patient Time For Goal Achievement: 03/08/21 Potential to Achieve Goals: Good  Plan Discharge plan remains appropriate;Frequency remains appropriate    Co-evaluation                 AM-PAC OT "6 Clicks" Daily Activity     Outcome Measure   Help from another person eating meals?: None Help from another person taking care of personal grooming?: A Little Help from another person toileting, which includes using toliet, bedpan, or urinal?: A Lot Help from another person bathing (including washing, rinsing, drying)?: A Lot Help from another person to put on and taking off regular upper body clothing?: A Little Help from another person to put on and taking off regular lower body clothing?: A Lot 6 Click Score: 16    End of Session Equipment Utilized During Treatment: Gait belt;Rolling walker (2 wheels)  OT Visit Diagnosis: Unsteadiness on feet (R26.81);History of falling (Z91.81);Pain Pain - Right/Left: Left Pain - part  of body: Hip   Activity Tolerance Patient tolerated treatment well   Patient Left in chair;with call bell/phone within reach;with chair alarm set   Nurse Communication Mobility status        Time: 8242-3536 OT Time Calculation (min): 22 min  Charges: OT General Charges $OT Visit: 1 Visit OT Treatments $Self Care/Home Management : 8-22 mins  Ardeth Perfect., MPH, MS, OTR/L ascom [phone] 02/24/21, 9:31 AM

## 2021-02-24 NOTE — Care Management Important Message (Signed)
Important Message  Patient Details  Name: Jamie Brennan MRN: [id number] Date of Birth: December 15, 1931   Medicare Important Message Given:  Yes     Juliann Pulse A Taylan Mayhan 02/24/2021, 2:43 PM

## 2021-02-24 NOTE — Progress Notes (Signed)
Physical Therapy Treatment Patient Details Name: Jamie Brennan MRN: [id number] DOB: 1931-06-21 Today's Date: 02/24/2021   History of Present Illness Patient is a 88 year old male who fell and suffered a left intertrochanteric hip fracture s/p IM nail. Past medical history significant for prostate cancer, stage IIIa chronic kidney disease, and duodenitis    PT Comments    Pt returned to bed with mod a x 2.  Did better this session as transfer it to R so step quality is improved.  Positioned for comfort in bed.    Recommendations for follow up therapy are one component of a multi-disciplinary discharge planning process, led by the attending physician.  Recommendations may be updated based on patient status, additional functional criteria and insurance authorization.  Follow Up Recommendations  Skilled nursing-short term rehab (<3 hours/day)     Assistance Recommended at Discharge Frequent or constant Supervision/Assistance  Patient can return home with the following A lot of help with walking and/or transfers;A lot of help with bathing/dressing/bathroom;Assist for transportation;Help with stairs or ramp for entrance;Two people to help with walking and/or transfers;Two people to help with bathing/dressing/bathroom   Equipment Recommendations       Recommendations for Other Services       Precautions / Restrictions Precautions Precautions: Fall Restrictions Weight Bearing Restrictions: Yes LLE Weight Bearing: Weight bearing as tolerated     Mobility  Bed Mobility Overal bed mobility: Needs Assistance Bed Mobility: Sit to Supine     Supine to sit: Min assist, +2 for physical assistance Sit to supine: Mod assist, +2 for physical assistance        Transfers Overall transfer level: Needs assistance Equipment used: Rolling walker (2 wheels) Transfers: Sit to/from Stand Sit to Stand: Mod assist, +2 physical assistance   Step pivot transfers: Mod assist, +2 physical  assistance       General transfer comment: did better stepping to return to bed.  going to right vs left to chair which is expected givne L hip fx.    Ambulation/Gait Ambulation/Gait assistance: Mod assist, +2 physical assistance Gait Distance (Feet): 2 Feet Assistive device: Rolling walker (2 wheels) Gait Pattern/deviations: Step-to pattern, Decreased step length - right, Decreased step length - left Gait velocity: decreased     General Gait Details: did better stepping to return to bed.  going to right vs left to chair which is expected givne L hip fx.   Stairs             Wheelchair Mobility    Modified Rankin (Stroke Patients Only)       Balance Overall balance assessment: Needs assistance Sitting-balance support: Single extremity supported Sitting balance-Leahy Scale: Fair     Standing balance support: Bilateral upper extremity supported Standing balance-Leahy Scale: Poor                              Cognition Arousal/Alertness: Awake/alert Behavior During Therapy: WFL for tasks assessed/performed Overall Cognitive Status: Within Functional Limits for tasks assessed                                          Exercises Other Exercises Other Exercises: supine and seated AROM x 10    General Comments        Pertinent Vitals/Pain Pain Assessment Pain Assessment: Faces Faces Pain Scale: Hurts little more Pain  Location: L hip during mobility, down to a 4/10 at rest Pain Descriptors / Indicators: Sore Pain Intervention(s): Limited activity within patient's tolerance, Monitored during session, Premedicated before session    Home Living                          Prior Function            PT Goals (current goals can now be found in the care plan section) Progress towards PT goals: Progressing toward goals    Frequency    BID      PT Plan Current plan remains appropriate    Co-evaluation PT/OT/SLP  Co-Evaluation/Treatment: Yes Reason for Co-Treatment: For patient/therapist safety PT goals addressed during session: Mobility/safety with mobility;Strengthening/ROM OT goals addressed during session: ADL's and self-care      AM-PAC PT "6 Clicks" Mobility   Outcome Measure  Help needed turning from your back to your side while in a flat bed without using bedrails?: A Little Help needed moving from lying on your back to sitting on the side of a flat bed without using bedrails?: A Lot Help needed moving to and from a bed to a chair (including a wheelchair)?: A Lot Help needed standing up from a chair using your arms (e.g., wheelchair or bedside chair)?: A Lot Help needed to walk in hospital room?: A Lot Help needed climbing 3-5 steps with a railing? : Total 6 Click Score: 12    End of Session Equipment Utilized During Treatment: Gait belt Activity Tolerance: Patient tolerated treatment well;Patient limited by pain Patient left: in chair;with call bell/phone within reach;with chair alarm set Nurse Communication: Mobility status PT Visit Diagnosis: Muscle weakness (generalized) (M62.81);Other abnormalities of gait and mobility (R26.89);Pain Pain - Right/Left: Left Pain - part of body: Hip     Time: 6314-9702 PT Time Calculation (min) (ACUTE ONLY): 14 min  Charges:  $Therapeutic Exercise: 8-22 mins $Therapeutic Activity: 8-22 mins                    Chesley Noon, PTA 02/24/21, 11:35 AM

## 2021-02-24 NOTE — TOC Progression Note (Signed)
Transition of Care [facility]) - Progression Note    Patient Details  Name: VANESSA ALESI MRN: [id number] Date of Birth: January 06, 1932  Transition [facility]) CM/SW [place], RN Phone Number: 02/24/2021, 12:16 PM  Clinical Narrative:   [place] has issued approval Ref number [id number] 2/14-2/16  To go to [facility] place    Expected Discharge Plan: [place] Barriers to Discharge: Continued Medical Work up  Expected Discharge Plan and Services Expected Discharge Plan: [place]   Discharge Planning Services: CM Consult   Living arrangements for the past 2 months:  (with Niece past STR in [place] after a fall in August 2022)                                       Social Determinants of Health (SDOH) Interventions    Readmission Risk Interventions No flowsheet data found.

## 2021-02-25 DIAGNOSIS — E538 Deficiency of other specified B group vitamins: Secondary | ICD-10-CM

## 2021-02-25 DIAGNOSIS — S72002P Fracture of unspecified part of neck of left femur, subsequent encounter for closed fracture with malunion: Secondary | ICD-10-CM

## 2021-02-25 DIAGNOSIS — N1831 Chronic kidney disease, stage 3a: Secondary | ICD-10-CM | POA: Diagnosis present

## 2021-02-25 DIAGNOSIS — D62 Acute posthemorrhagic anemia: Secondary | ICD-10-CM | POA: Diagnosis not present

## 2021-02-25 LAB — CBC
HCT: 23.3 % — ABNORMAL LOW (ref 39.0–52.0)
Hemoglobin: 7.5 g/dL — ABNORMAL LOW (ref 13.0–17.0)
MCH: 29.8 pg (ref 26.0–34.0)
MCHC: 32.2 g/dL (ref 30.0–36.0)
MCV: 92.5 fL (ref 80.0–100.0)
Platelets: 277 10*3/uL (ref 150–400)
RBC: 2.52 MIL/uL — ABNORMAL LOW (ref 4.22–5.81)
RDW: 12.4 % (ref 11.5–15.5)
WBC: 8.1 10*3/uL (ref 4.0–10.5)
nRBC: 0 % (ref 0.0–0.2)

## 2021-02-25 LAB — BASIC METABOLIC PANEL
Anion gap: 7 (ref 5–15)
BUN: 60 mg/dL — ABNORMAL HIGH (ref 8–23)
CO2: 22 mmol/L (ref 22–32)
Calcium: 8 mg/dL — ABNORMAL LOW (ref 8.9–10.3)
Chloride: 105 mmol/L (ref 98–111)
Creatinine, Ser: 1.73 mg/dL — ABNORMAL HIGH (ref 0.61–1.24)
GFR, Estimated: 38 mL/min — ABNORMAL LOW (ref >60)
Glucose, Bld: 108 mg/dL — ABNORMAL HIGH (ref 70–99)
Potassium: 4.4 mmol/L (ref 3.5–5.1)
Sodium: 134 mmol/L — ABNORMAL LOW (ref 135–145)

## 2021-02-25 LAB — FERRITIN: Ferritin: 105 ng/mL (ref 24–336)

## 2021-02-25 LAB — IRON AND TIBC
Iron: 30 ug/dL — ABNORMAL LOW (ref 45–182)
Saturation Ratios: 14 % — ABNORMAL LOW (ref 17.9–39.5)
TIBC: 223 ug/dL — ABNORMAL LOW (ref 250–450)
UIBC: 193 ug/dL

## 2021-02-25 LAB — HEMOGLOBIN AND HEMATOCRIT, BLOOD
HCT: 26.2 % — ABNORMAL LOW (ref 39.0–52.0)
Hemoglobin: 8.5 g/dL — ABNORMAL LOW (ref 13.0–17.0)

## 2021-02-25 LAB — FOLATE: Folate: 17.6 ng/mL (ref >5.9)

## 2021-02-25 LAB — VITAMIN B12: Vitamin B-12: 124 pg/mL — ABNORMAL LOW (ref 180–914)

## 2021-02-25 MED ORDER — CYANOCOBALAMIN 1000 MCG/ML IJ SOLN
1000.0000 ug | Freq: Every day | INTRAMUSCULAR | Status: DC
  Administered 2021-02-25 – 2021-02-26 (×2): 1000 ug via SUBCUTANEOUS
  Filled 2021-02-25 (×3): qty 1

## 2021-02-25 MED ORDER — CEFDINIR 300 MG PO CAPS
300.0000 mg | ORAL_CAPSULE | Freq: Two times a day (BID) | ORAL | Status: DC
  Administered 2021-02-25 – 2021-02-27 (×5): 300 mg via ORAL
  Filled 2021-02-25 (×7): qty 1

## 2021-02-25 MED ORDER — DOXYCYCLINE HYCLATE 100 MG PO TABS
100.0000 mg | ORAL_TABLET | Freq: Two times a day (BID) | ORAL | Status: DC
  Administered 2021-02-25 – 2021-02-27 (×5): 100 mg via ORAL
  Filled 2021-02-25 (×5): qty 1

## 2021-02-25 MED ORDER — SODIUM CHLORIDE 0.9 % IV SOLN: INTRAVENOUS | Status: DC

## 2021-02-25 NOTE — Progress Notes (Signed)
PROGRESS NOTE    Jamie Brennan  YHC:[id number] DOB: Mar 18, 1931 DOA: 02/20/2021 PCP: System, Provider Not In    Chief Complaint  Patient presents with   Fall    Brief Narrative:  Patient is a 88 year old gentleman, history of prostate cancer, stage IIIa CKD, duodenitis presented with left hip pain after mechanical fall.  Patient denied any presyncope or syncopal episodes.  Patient seen in the ED imaging/CT scan revealed intertrochanteric left hip fracture.  Patient seen in consultation by orthopedics.  Patient subsequently underwent left IM nail to the left intertrochanteric.  Patient noted to have increased drainage around wound/operative site.  PT OT assessed patient.  Patient also noted to have a post up acute blood loss anemia as well.  Patient seen by PT/OT who are recommending SNF   Assessment & Plan:   Principal Problem:   Closed left hip fracture ([place]) Active Problems:   Acute postoperative anemia due to expected blood loss   Stage 3a chronic kidney disease (CKD) (HCC)   Vitamin B12 deficiency  #1 closed left hip fracture -Secondary to mechanical fall. -Status post IM nail to left hip intertrochanteric. -Patient noted to have serosanguineous drainage around wound site and assessed by orthopedics who are recommending assessment by wound care to assess for incisional wound VAC to be applied to keep incision dry. -Dressing changes recommended by orthopedics. -Weightbearing as tolerated left lower extremity. -Orthopedics recommending prophylactic antibiotic and and as such we will place patient on doxycycline and Omnicef due to chronic kidney disease and as such we will hold off on Bactrim DS. -PT/OT. -Likely needs SNF -Per orthopedics.  2.  Acute postop anemia/acute blood loss anemia/anemia of chronic disease -Hemoglobin currently at 7.5 this morning. -Patient with no overt GI bleed. -Repeat H&H. -Transfusion threshold hemoglobin < 7.  3.  CKD stage  IIIa -Stable.  4.  Vitamin B12 deficiency -Vitamin B12 levels at 124 -Place on vitamin B12 1000 mcg subcutaneously daily x7 days, then weekly x1 month, then monthly.  -Could also consider oral vitamin B12 supplementation on discharge.   DVT prophylaxis: SCDs, per orthopedics Code Status: DNR Family Communication: Updated patient.  No family at bedside. Disposition:   Status is: Inpatient Remains inpatient appropriate because: Severity of illness           Consultants:  Orthopedics: Dr. Sharlet Salina 02/21/2021  Procedures:  CT pelvis 02/20/2021 CT head 02/20/2021 CT C-spine 02/20/2021 Chest x-ray 02/20/2021 Plain films of the left hip and pelvis 02/20/2021, 02/21/2021 Left IM nail intertrochanteric.  Dr. Sharlet Salina orthopedics 02/21/2021  Antimicrobials:  Doxycycline 02/25/2021>>> 03/04/2021 Omnicef 02/25/2021>>>> 03/04/2021   Subjective: Patient sitting up in chair.  Denies any chest pain.  No shortness of breath.  No abdominal pain.  Overall some improvement with left hip pain.  Per RN patient dressing this morning soaked with serosanguineous drainage.  Objective: Vitals:   02/25/21 0500 02/25/21 0726 02/25/21 1138 02/25/21 1635  BP:  107/65 111/62 117/66  Pulse:  68 72 69  Resp:  14 15 16   Temp:  97.7 F (36.5 C) 97.7 F (36.5 C) 98.6 F (37 C)  TempSrc:      SpO2:  97% 100% 100%  Weight: 63.3 kg     Height:        Intake/Output Summary (Last 24 hours) at 02/25/2021 1711 Last data filed at 02/25/2021 1508 Gross per 24 hour  Intake 240 ml  Output 600 ml  Net -360 ml   Filed Weights   02/23/21 0500 02/24/21 0500 02/25/21  0500  Weight: 63.6 kg 64.4 kg 63.3 kg    Examination:  General exam: Appears calm and comfortable  Respiratory system: Clear to auscultation. Respiratory effort normal. Cardiovascular system: S1 & S2 heard, RRR. No JVD, murmurs, rubs, gallops or clicks. No pedal edema. Gastrointestinal system: Abdomen is nondistended, soft and nontender. No  organomegaly or masses felt. Normal bowel sounds heard. Central nervous system: Alert and oriented. No focal neurological deficits. Extremities: Left hip with dressing in place.  Skin: No rashes, lesions or ulcers Psychiatry: Judgement and insight appear normal. Mood & affect appropriate.     Data Reviewed: I have personally reviewed following labs and imaging studies  CBC: Recent Labs  Lab 02/20/21 1920 02/21/21 0514 02/23/21 0605 02/24/21 0625 02/25/21 0559 02/25/21 1427  WBC 8.7 9.1 9.6 8.6 8.1  --   NEUTROABS 7.2  --   --   --   --   --   HGB 12.2* 9.8* 7.9* 8.8* 7.5* 8.5*  HCT 37.2* 29.9* 24.5* 25.6* 23.3* 26.2*  MCV 92.8 92.3 92.5 93.8 92.5  --   PLT 286 238 214 268 277  --     Basic Metabolic Panel: Recent Labs  Lab 02/20/21 1920 02/21/21 0514 02/23/21 0605 02/24/21 0625 02/25/21 0559  NA 139 138 134* 137 134*  K 4.6 4.2 4.2 4.0 4.4  CL 105 106 102 106 105  CO2 24 21* 22 22 22   GLUCOSE 136* 126* 112* 105* 108*  BUN 35* 35* 53* 57* 60*  CREATININE 1.62* 1.46* 1.70* 1.50* 1.73*  CALCIUM 9.0 8.5* 8.2* 8.4* 8.0*    GFR: Estimated Creatinine Clearance: 26.9 mL/min (A) (by C-G formula based on SCr of 1.73 mg/dL (H)).  Liver Function Tests: Recent Labs  Lab 02/20/21 1920  AST 28  ALT 30  ALKPHOS 97  BILITOT 0.7  PROT 8.2*  ALBUMIN 4.3    CBG: No results for input(s): GLUCAP in the last 168 hours.   Recent Results (from the past 240 hour(s))  Resp Panel by RT-PCR (Flu A&B, Covid) Nasopharyngeal Swab     Status: None   Collection Time: 02/20/21  7:20 PM   Specimen: Nasopharyngeal Swab; Nasopharyngeal(NP) swabs in vial transport medium  Result Value Ref Range Status   SARS Coronavirus 2 by RT PCR NEGATIVE NEGATIVE Final    Comment: (NOTE) SARS-CoV-2 target nucleic acids are NOT DETECTED.  The SARS-CoV-2 RNA is generally detectable in upper respiratory specimens during the acute phase of infection. The lowest concentration of SARS-CoV-2 viral  copies this assay can detect is 138 copies/mL. A negative result does not preclude SARS-Cov-2 infection and should not be used as the sole basis for treatment or other patient management decisions. A negative result may occur with  improper specimen collection/handling, submission of specimen other than nasopharyngeal swab, presence of viral mutation(s) within the areas targeted by this assay, and inadequate number of viral copies(<138 copies/mL). A negative result must be combined with clinical observations, patient history, and epidemiological information. The expected result is Negative.  Fact Sheet for Patients:  EntrepreneurPulse.com.au  Fact Sheet for Healthcare Providers:  IncredibleEmployment.be  This test is no t yet approved or cleared by the [place] FDA and  has been authorized for detection and/or diagnosis of SARS-CoV-2 by FDA under an Emergency Use Authorization (EUA). This EUA will remain  in effect (meaning this test can be used) for the duration of the COVID-19 declaration under Section 564(b)(1) of the Act, 21 U.S.C.section 360bbb-3(b)(1), unless the authorization is terminated  or revoked sooner.       Influenza A by PCR NEGATIVE NEGATIVE Final   Influenza B by PCR NEGATIVE NEGATIVE Final    Comment: (NOTE) The Xpert Xpress SARS-CoV-2/FLU/RSV plus assay is intended as an aid in the diagnosis of influenza from Nasopharyngeal swab specimens and should not be used as a sole basis for treatment. Nasal washings and aspirates are unacceptable for Xpert Xpress SARS-CoV-2/FLU/RSV testing.  Fact Sheet for Patients: EntrepreneurPulse.com.au  Fact Sheet for Healthcare Providers: IncredibleEmployment.be  This test is not yet approved or cleared by the [place] FDA and has been authorized for detection and/or diagnosis of SARS-CoV-2 by FDA under an Emergency Use Authorization (EUA). This  EUA will remain in effect (meaning this test can be used) for the duration of the COVID-19 declaration under Section 564(b)(1) of the Act, 21 U.S.C. section 360bbb-3(b)(1), unless the authorization is terminated or revoked.  Performed at [facility], [address] [place], [place] [address]   Surgical PCR screen     Status: None   Collection Time: 02/20/21 11:42 PM   Specimen: Nasal Mucosa; Nasal Swab  Result Value Ref Range Status   MRSA, PCR NEGATIVE NEGATIVE Final   Staphylococcus aureus NEGATIVE NEGATIVE Final    Comment: (NOTE) The Xpert SA Assay (FDA approved for NASAL specimens in patients 54 years of age and older), is one component of a comprehensive surveillance program. It is not intended to diagnose infection nor to guide or monitor treatment. Performed at [facility], [address], [place], [place] [address]          Radiology Studies: No results found.      Scheduled Meds:  cefdinir  300 mg Oral Q12H   Chlorhexidine Gluconate Cloth  6 each Topical Daily   cyanocobalamin  1,000 mcg Subcutaneous q1800   docusate sodium  200 mg Oral BID   doxycycline  100 mg Oral Q12H   feeding supplement  237 mL Oral BID BM   Continuous Infusions:  sodium chloride 10 mL/hr (02/21/21 2156)   sodium chloride 75 mL/hr at 02/25/21 1029     LOS: 5 days    Time spent: 40 minutes    Irine Seal, MD [facility]   To contact the attending provider between 7A-7P or the covering provider during after hours 7P-7A, please log into the web site [email or web address] and access using universal [place] password for that web site. If you do not have the password, please call the hospital operator.  02/25/2021, 5:11 PM

## 2021-02-25 NOTE — Progress Notes (Signed)
Occupational Therapy Treatment Patient Details Name: Jamie Brennan MRN: [id number] DOB: 10/19/1931 Today's Date: 02/25/2021   History of present illness Patient is a 88 year old male who fell and suffered a left intertrochanteric hip fracture s/p IM nail. Past medical history significant for prostate cancer, stage IIIa chronic kidney disease, and duodenitis   OT comments  Jamie Brennan was seen for OT tx and co-tx with PT to address ADLs and safety with functional mobility. Units split per protocols. Pt endorsing mild LLE pain and agreeable to tx session. He requires +2 min guard to stand at EOB. Upon sitting upright, pt noted to have large amount of yellow/bloody discharge from his incision site. RN notified and in room to assess. He stood at EOB fro ~10 min during standing peri-care and dressing change. He requires +2 min guard to step pivot t/f to recliner. MAX A to doff/don bilat hospital socks and SET UP-MIN assist for UB grooming. Pt progressing towards goals, continues to benefit from skilled OT services. Continue to recommend SNF prior to returning to home in [place]   Recommendations for follow up therapy are one component of a multi-disciplinary discharge planning process, led by the attending physician.  Recommendations may be updated based on patient status, additional functional criteria and insurance authorization.    Follow Up Recommendations  Skilled nursing-short term rehab (<3 hours/day)    Assistance Recommended at Discharge Intermittent Supervision/Assistance  Patient can return home with the following  Two people to help with walking and/or transfers;Two people to help with bathing/dressing/bathroom;Assistance with cooking/housework;Assist for transportation;Help with stairs or ramp for entrance;Direct supervision/assist for medications management   Equipment Recommendations  BSC/3in1;Other (comment)    Recommendations for Other Services      Precautions / Restrictions  Precautions Precautions: Fall Restrictions Weight Bearing Restrictions: Yes LLE Weight Bearing: Weight bearing as tolerated       Mobility Bed Mobility Overal bed mobility: Needs Assistance Bed Mobility: Supine to Sit     Supine to sit: Min assist     General bed mobility comments: min A to bring L LE off bed    Transfers Overall transfer level: Needs assistance Equipment used: Rolling walker (2 wheels) Transfers: Sit to/from Stand Sit to Stand: Min guard, +2 safety/equipment, From elevated surface, Min assist     Step pivot transfers: Min guard, +2 physical assistance     General transfer comment: improved stepping from bed to chair this date.     Balance Overall balance assessment: Needs assistance Sitting-balance support: Feet supported Sitting balance-Leahy Scale: Good       Standing balance-Leahy Scale: Fair Standing balance comment: requires assist in standing with BUE support on RW. min guard initially but as time increased in standing he required min A                           ADL either performed or assessed with clinical judgement   ADL Overall ADL's : Needs assistance/impaired     Grooming: Sitting;Set up;Oral care;Wash/dry face;Minimal assistance Grooming Details (indicate cue type and reason): MIN A for thoroughness in cleaning dentures             Lower Body Dressing: Maximal assistance;Sit to/from stand Lower Body Dressing Details (indicate cue type and reason): MAX A to doff/don bilat hospital socks while seated in recliner.     Toileting- Clothing Manipulation and Hygiene: Maximal assistance;Sit to/from stand Toileting - Clothing Manipulation Details (indicate cue type and reason):  MAX A to perform posterior peri-care while standing. Pt incontenent of bowel, requires increased assist for thoroughness.     Functional mobility during ADLs: Minimal assistance;+2 for safety/equipment;+2 for physical assistance General ADL  Comments: VC for hand placement, anterior weight shift    Extremity/Trunk Assessment Upper Extremity Assessment Upper Extremity Assessment: Generalized weakness   Lower Extremity Assessment Lower Extremity Assessment: Generalized weakness        Vision Ability to See in Adequate Light: 0 Adequate Patient Visual Report: No change from baseline     Perception     Praxis      Cognition Arousal/Alertness: Awake/alert Behavior During Therapy: WFL for tasks assessed/performed Overall Cognitive Status: Within Functional Limits for tasks assessed                                 General Comments: Patient is very pleasant and cooperative        Exercises Other Exercises Other Exercises: OT/PT facilitated bed/functional mobility, standing peri-care, and standing during RN dressing change. OT facilitated UB grooming tasks at end of session.    Shoulder Instructions       General Comments      Pertinent Vitals/ Pain       Pain Assessment Pain Assessment: Faces Faces Pain Scale: Hurts a little bit Pain Location: L hip during mobility Pain Descriptors / Indicators: Discomfort, Sore Pain Intervention(s): Monitored during session, Repositioned, RN gave pain meds during session  Home Living                                          Prior Functioning/Environment              Frequency  Min 2X/week        Progress Toward Goals  OT Goals(current goals can now be found in the care plan section)  Progress towards OT goals: Progressing toward goals  Acute Rehab OT Goals Patient Stated Goal: To return to his home in [place] when he is safe to do so. OT Goal Formulation: With patient Time For Goal Achievement: 03/08/21 Potential to Achieve Goals: Good  Plan Discharge plan remains appropriate;Frequency remains appropriate    Co-evaluation      Reason for Co-Treatment: Complexity of the patient's impairments (multi-system involvement);For  patient/therapist safety;To address functional/ADL transfers PT goals addressed during session: Mobility/safety with mobility;Balance;Proper use of DME OT goals addressed during session: ADL's and self-care      AM-PAC OT "6 Clicks" Daily Activity     Outcome Measure   Help from another person eating meals?: None Help from another person taking care of personal grooming?: A Little Help from another person toileting, which includes using toliet, bedpan, or urinal?: A Lot Help from another person bathing (including washing, rinsing, drying)?: A Lot Help from another person to put on and taking off regular upper body clothing?: A Little Help from another person to put on and taking off regular lower body clothing?: A Lot 6 Click Score: 16    End of Session Equipment Utilized During Treatment: Gait belt;Rolling walker (2 wheels)  OT Visit Diagnosis: Unsteadiness on feet (R26.81);History of falling (Z91.81);Pain Pain - Right/Left: Left Pain - part of body: Hip   Activity Tolerance Patient tolerated treatment well   Patient Left in chair;with call bell/phone within reach;with chair alarm set   Nurse  Communication Mobility status        Time: 3536-1443 OT Time Calculation (min): 37 min  Charges: OT General Charges $OT Visit: 1 Visit OT Treatments $Self Care/Home Management : 23-37 mins  Shara Blazing, M.S., OTR/L [facility] Ascom: [phone] 02/25/21, 12:41 PM

## 2021-02-25 NOTE — Progress Notes (Signed)
Physical Therapy Treatment Patient Details Name: Jamie Brennan MRN: [id number] DOB: Dec 20, 1931 Today's Date: 02/25/2021   History of Present Illness Patient is a 88 year old male who fell and suffered a left intertrochanteric hip fracture s/p IM nail. Past medical history significant for prostate cancer, stage IIIa chronic kidney disease, and duodenitis    PT Comments    Patient received in bed. Agrees to PT session. He is pleasant and cooperative throughout session. Patient making progress toward goals. He required min assist for supine to sit. Patient is able to stand with min assist. Stood for several minutes for cleaning and dressing change. Patient able to ambulate about 5 feet with RW and min +2 assist. He will continue to benefit from skilled PT while here to improve functional independence and safety.      Recommendations for follow up therapy are one component of a multi-disciplinary discharge planning process, led by the attending physician.  Recommendations may be updated based on patient status, additional functional criteria and insurance authorization.  Follow Up Recommendations  Skilled nursing-short term rehab (<3 hours/day)     Assistance Recommended at Discharge Intermittent Supervision/Assistance  Patient can return home with the following A lot of help with walking and/or transfers;A little help with bathing/dressing/bathroom;Help with stairs or ramp for entrance;Assist for transportation;Assistance with cooking/housework   Equipment Recommendations  None recommended by PT    Recommendations for Other Services       Precautions / Restrictions Precautions Precautions: Fall Restrictions Weight Bearing Restrictions: Yes LLE Weight Bearing: Weight bearing as tolerated     Mobility  Bed Mobility Overal bed mobility: Needs Assistance Bed Mobility: Supine to Sit     Supine to sit: Min assist     General bed mobility comments: min A to bring L LE off bed     Transfers Overall transfer level: Needs assistance Equipment used: Rolling walker (2 wheels) Transfers: Sit to/from Stand Sit to Stand: Min guard, +2 safety/equipment, From elevated surface                Ambulation/Gait Ambulation/Gait assistance: Min guard, +2 safety/equipment Gait Distance (Feet): 5 Feet Assistive device: Rolling walker (2 wheels) Gait Pattern/deviations: Step-to pattern, Decreased step length - right, Decreased step length - left, Narrow base of support Gait velocity: decreased     General Gait Details: patient requires cues and assist to stay inside RW during mobility. Patient was able to stand for several minutes at edge of bed for clean up of bottom and wound dressing change.   Stairs             Wheelchair Mobility    Modified Rankin (Stroke Patients Only)       Balance Overall balance assessment: Needs assistance Sitting-balance support: Feet supported Sitting balance-Leahy Scale: Good     Standing balance support: Bilateral upper extremity supported, During functional activity, Reliant on assistive device for balance Standing balance-Leahy Scale: Fair Standing balance comment: requires assist in standing with BUE support on RW. min guard initially but as time increased in standing he required min A                            Cognition Arousal/Alertness: Awake/alert Behavior During Therapy: WFL for tasks assessed/performed Overall Cognitive Status: Within Functional Limits for tasks assessed  General Comments: Patient is very pleasant and cooperative        Exercises      General Comments        Pertinent Vitals/Pain Pain Assessment Pain Assessment: Faces Faces Pain Scale: Hurts a little bit Pain Location: L hip during mobility Pain Descriptors / Indicators: Discomfort, Sore Pain Intervention(s): Monitored during session, Repositioned    Home Living                           Prior Function            PT Goals (current goals can now be found in the care plan section) Acute Rehab PT Goals Patient Stated Goal: to be able to walk PT Goal Formulation: With patient Time For Goal Achievement: 03/08/21 Potential to Achieve Goals: Good Progress towards PT goals: Progressing toward goals    Frequency    BID      PT Plan Current plan remains appropriate    Co-evaluation PT/OT/SLP Co-Evaluation/Treatment: Yes Reason for Co-Treatment: For patient/therapist safety;To address functional/ADL transfers PT goals addressed during session: Mobility/safety with mobility;Balance;Proper use of DME        AM-PAC PT "6 Clicks" Mobility   Outcome Measure  Help needed turning from your back to your side while in a flat bed without using bedrails?: A Little Help needed moving from lying on your back to sitting on the side of a flat bed without using bedrails?: A Little Help needed moving to and from a bed to a chair (including a wheelchair)?: A Lot Help needed standing up from a chair using your arms (e.g., wheelchair or bedside chair)?: A Little Help needed to walk in hospital room?: A Lot Help needed climbing 3-5 steps with a railing? : Total 6 Click Score: 14    End of Session Equipment Utilized During Treatment: Gait belt Activity Tolerance: Patient tolerated treatment well;Patient limited by fatigue Patient left: in chair;with call bell/phone within reach;Other (comment) (OT in room) Nurse Communication: Mobility status PT Visit Diagnosis: Muscle weakness (generalized) (M62.81);Difficulty in walking, not elsewhere classified (R26.2);Unsteadiness on feet (R26.81);History of falling (Z91.81) Pain - Right/Left: Left Pain - part of body: Hip     Time: 3846-6599 PT Time Calculation (min) (ACUTE ONLY): 22 min  Charges:  $Gait Training: 8-22 mins                     Makeshia Seat, PT, GCS 02/25/21,10:51 AM

## 2021-02-25 NOTE — Progress Notes (Signed)
I notified MD and Ortho regarding saturated wound dressing. Stapled incision site had minor serosanguinous drainage. I performed dressing change with gauze and ABD pad.   MD has assessed site and notified me of new orders. RN ordered to change dressing 1-2x per day.

## 2021-02-25 NOTE — Progress Notes (Signed)
Subjective:  Patient is sitting in bedside chair.  Appears comfortable.  Is making slow progress with PT.  Objective:   VITALS:   Vitals:   02/25/21 0241 02/25/21 0500 02/25/21 0726 02/25/21 1138  BP: 125/85  107/65 111/62  Pulse: 83  68 72  Resp: 18  14 15   Temp: 98.4 F (36.9 C)  97.7 F (36.5 C) 97.7 F (36.5 C)  TempSrc:      SpO2: 99%  97% 100%  Weight:  63.3 kg    Height:        PHYSICAL EXAM:  General: Alert, comfortable Left lower extremity: Proximal vision has serosanguineous ooze around staple site.  Unable to express much fluid.  Remainder of dressings remain clean and dry.  There is no evidence of erythema or wound dehiscence.   LABS  Results for orders placed or performed during the hospital encounter of 02/20/21 (from the past 24 hour(s))  CBC     Status: Abnormal   Collection Time: 02/25/21  5:59 AM  Result Value Ref Range   WBC 8.1 4.0 - 10.5 K/uL   RBC 2.52 (L) 4.22 - 5.81 MIL/uL   Hemoglobin 7.5 (L) 13.0 - 17.0 g/dL   HCT 23.3 (L) 39.0 - 52.0 %   MCV 92.5 80.0 - 100.0 fL   MCH 29.8 26.0 - 34.0 pg   MCHC 32.2 30.0 - 36.0 g/dL   RDW 12.4 11.5 - 15.5 %   Platelets 277 150 - 400 K/uL   nRBC 0.0 0.0 - 0.2 %  Basic metabolic panel     Status: Abnormal   Collection Time: 02/25/21  5:59 AM  Result Value Ref Range   Sodium 134 (L) 135 - 145 mmol/L   Potassium 4.4 3.5 - 5.1 mmol/L   Chloride 105 98 - 111 mmol/L   CO2 22 22 - 32 mmol/L   Glucose, Bld 108 (H) 70 - 99 mg/dL   BUN 60 (H) 8 - 23 mg/dL   Creatinine, Ser 1.73 (H) 0.61 - 1.24 mg/dL   Calcium 8.0 (L) 8.9 - 10.3 mg/dL   GFR, Estimated 38 (L) >60 mL/min   Anion gap 7 5 - 15  Folate     Status: None   Collection Time: 02/25/21  8:59 AM  Result Value Ref Range   Folate 17.6 >5.9 ng/mL  Iron and TIBC     Status: Abnormal   Collection Time: 02/25/21  8:59 AM  Result Value Ref Range   Iron 30 (L) 45 - 182 ug/dL   TIBC 223 (L) 250 - 450 ug/dL   Saturation Ratios 14 (L) 17.9 - 39.5 %   UIBC  193 ug/dL  Ferritin     Status: None   Collection Time: 02/25/21  8:59 AM  Result Value Ref Range   Ferritin 105 24 - 336 ng/mL    No results found.  Assessment/Plan: 4 Days Post-Op  Status post left hip IM nail  -Appreciate hospitalist team support -I will reach out to [facility] with wound care to ask if an incisional wound VAC can be applied to keep the incision dry.  In the meantime we will plan for once or twice daily dressing changes. -Recommend starting Bactrim DS for prophylactic antibacterial coverage. -PT/OT: Weight-bear as tolerated LLE -DVT prophylaxis: Hold until wound drainage decreases -Discharge per PT/hospitalist Case management: SNF -Follow-up in clinic in approximately 2 weeks for x-rays and staple removal   Renee Harder , MD 02/25/2021, 12:57 PM

## 2021-02-25 NOTE — Progress Notes (Signed)
Physical Therapy Treatment Patient Details Name: Jamie Brennan MRN: [id number] DOB: 22-Oct-1931 Today's Date: 02/25/2021   History of Present Illness Patient is a 88 year old male who fell and suffered a left intertrochanteric hip fracture s/p IM nail. Past medical history significant for prostate cancer, stage IIIa chronic kidney disease, and duodenitis    PT Comments    Patient received in recliner. He reports he is cold. Wants to get back into bed. Bed is unmade. Made bed, upon assisting patient to standing patient found to be soiled. He required min assist to stand from recliner and take a few steps to bed. NT came to assist cleaning patient as he needed min assist for static standing at edge of bed during clean up. Patient needs min assist to return to supine as he cannot bring L LE up onto bed independently. Patient will continue to benefit from skilled PT while here to improve functional independence, safety and strength.        Recommendations for follow up therapy are one component of a multi-disciplinary discharge planning process, led by the attending physician.  Recommendations may be updated based on patient status, additional functional criteria and insurance authorization.  Follow Up Recommendations  Skilled nursing-short term rehab (<3 hours/day)     Assistance Recommended at Discharge Intermittent Supervision/Assistance  Patient can return home with the following A lot of help with walking and/or transfers;A lot of help with bathing/dressing/bathroom;Help with stairs or ramp for entrance;Assistance with cooking/housework   Equipment Recommendations  None recommended by PT    Recommendations for Other Services       Precautions / Restrictions Precautions Precautions: Fall Restrictions Weight Bearing Restrictions: Yes LLE Weight Bearing: Weight bearing as tolerated     Mobility  Bed Mobility Overal bed mobility: Needs Assistance Bed Mobility: Sit to Supine      Supine to sit: Min assist Sit to supine: Min assist   General bed mobility comments: min A to bring L LE onto bed    Transfers Overall transfer level: Needs assistance Equipment used: Rolling walker (2 wheels) Transfers: Sit to/from Stand Sit to Stand: Min assist           General transfer comment: min assist for sit to stand from recliner. Cues for hand placement. steadying needed with initial standing    Ambulation/Gait Ambulation/Gait assistance: Min assist Gait Distance (Feet): 5 Feet Assistive device: Rolling walker (2 wheels) Gait Pattern/deviations: Step-to pattern, Shuffle, Narrow base of support, Decreased step length - left, Decreased step length - right Gait velocity: decreased     General Gait Details: patient requires cues and assist to stay inside RW during mobility. Patient was able to stand for several minutes at edge of bed for clean up of bottom.   Stairs             Wheelchair Mobility    Modified Rankin (Stroke Patients Only)       Balance Overall balance assessment: Needs assistance Sitting-balance support: Feet supported Sitting balance-Leahy Scale: Good     Standing balance support: Bilateral upper extremity supported, During functional activity Standing balance-Leahy Scale: Fair Standing balance comment: requires assist in standing with BUE support on RW. min A                            Cognition Arousal/Alertness: Awake/alert Behavior During Therapy: WFL for tasks assessed/performed Overall Cognitive Status: Within Functional Limits for tasks assessed  General Comments: Patient is very pleasant and cooperative        Exercises      General Comments        Pertinent Vitals/Pain Pain Assessment Pain Assessment: Faces Faces Pain Scale: Hurts a little bit Pain Location: L hip during mobility Pain Descriptors / Indicators: Discomfort, Sore Pain  Intervention(s): Monitored during session, Repositioned    Home Living                          Prior Function            PT Goals (current goals can now be found in the care plan section) Acute Rehab PT Goals Patient Stated Goal: to be able to walk PT Goal Formulation: With patient Time For Goal Achievement: 03/08/21 Potential to Achieve Goals: Good Progress towards PT goals: Progressing toward goals    Frequency    BID      PT Plan Current plan remains appropriate    Co-evaluation   Reason for Co-Treatment: Complexity of the patient's impairments (multi-system involvement);For patient/therapist safety;To address functional/ADL transfers PT goals addressed during session: Mobility/safety with mobility;Balance;Proper use of DME OT goals addressed during session: ADL's and self-care      AM-PAC PT "6 Clicks" Mobility   Outcome Measure  Help needed turning from your back to your side while in a flat bed without using bedrails?: A Little Help needed moving from lying on your back to sitting on the side of a flat bed without using bedrails?: A Little Help needed moving to and from a bed to a chair (including a wheelchair)?: A Little Help needed standing up from a chair using your arms (e.g., wheelchair or bedside chair)?: A Little Help needed to walk in hospital room?: A Lot Help needed climbing 3-5 steps with a railing? : Total 6 Click Score: 15    End of Session Equipment Utilized During Treatment: Gait belt Activity Tolerance: Patient limited by fatigue Patient left: in bed;with call bell/phone within reach;with nursing/sitter in room Nurse Communication: Mobility status PT Visit Diagnosis: Muscle weakness (generalized) (M62.81);Difficulty in walking, not elsewhere classified (R26.2);Unsteadiness on feet (R26.81);History of falling (Z91.81);Other abnormalities of gait and mobility (R26.89) Pain - Right/Left: Left Pain - part of body: Hip     Time:  1435-1500 PT Time Calculation (min) (ACUTE ONLY): 25 min  Charges:  $Gait Training: 8-22 mins $Therapeutic Activity: 8-22 mins                     Kateleen Encarnacion, PT, GCS 02/25/21,3:31 PM

## 2021-02-26 LAB — CBC WITH DIFFERENTIAL/PLATELET
Abs Immature Granulocytes: 0.03 10*3/uL (ref 0.00–0.07)
Basophils Absolute: 0.1 10*3/uL (ref 0.0–0.1)
Basophils Relative: 1 %
Eosinophils Absolute: 0.2 10*3/uL (ref 0.0–0.5)
Eosinophils Relative: 2 %
HCT: 22.7 % — ABNORMAL LOW (ref 39.0–52.0)
Hemoglobin: 7.4 g/dL — ABNORMAL LOW (ref 13.0–17.0)
Immature Granulocytes: 0 %
Lymphocytes Relative: 11 %
Lymphs Abs: 0.9 10*3/uL (ref 0.7–4.0)
MCH: 30.2 pg (ref 26.0–34.0)
MCHC: 32.6 g/dL (ref 30.0–36.0)
MCV: 92.7 fL (ref 80.0–100.0)
Monocytes Absolute: 1.1 10*3/uL — ABNORMAL HIGH (ref 0.1–1.0)
Monocytes Relative: 15 %
Neutro Abs: 5.5 10*3/uL (ref 1.7–7.7)
Neutrophils Relative %: 71 %
Platelets: 299 10*3/uL (ref 150–400)
RBC: 2.45 MIL/uL — ABNORMAL LOW (ref 4.22–5.81)
RDW: 12.2 % (ref 11.5–15.5)
WBC: 7.7 10*3/uL (ref 4.0–10.5)
nRBC: 0 % (ref 0.0–0.2)

## 2021-02-26 LAB — HEMOGLOBIN AND HEMATOCRIT, BLOOD
HCT: 26.9 % — ABNORMAL LOW (ref 39.0–52.0)
Hemoglobin: 8.9 g/dL — ABNORMAL LOW (ref 13.0–17.0)

## 2021-02-26 LAB — BASIC METABOLIC PANEL
Anion gap: 6 (ref 5–15)
BUN: 63 mg/dL — ABNORMAL HIGH (ref 8–23)
CO2: 22 mmol/L (ref 22–32)
Calcium: 8.2 mg/dL — ABNORMAL LOW (ref 8.9–10.3)
Chloride: 108 mmol/L (ref 98–111)
Creatinine, Ser: 1.6 mg/dL — ABNORMAL HIGH (ref 0.61–1.24)
GFR, Estimated: 41 mL/min — ABNORMAL LOW (ref >60)
Glucose, Bld: 106 mg/dL — ABNORMAL HIGH (ref 70–99)
Potassium: 4.5 mmol/L (ref 3.5–5.1)
Sodium: 136 mmol/L (ref 135–145)

## 2021-02-26 LAB — PREPARE RBC (CROSSMATCH)

## 2021-02-26 LAB — MAGNESIUM: Magnesium: 2 mg/dL (ref 1.7–2.4)

## 2021-02-26 LAB — ABO/RH: ABO/RH(D): O POS

## 2021-02-26 MED ORDER — SODIUM CHLORIDE 0.9% IV SOLUTION: Freq: Once | INTRAVENOUS | Status: AC

## 2021-02-26 MED ORDER — DIPHENHYDRAMINE HCL 25 MG PO CAPS
25.0000 mg | ORAL_CAPSULE | Freq: Once | ORAL | Status: AC
  Administered 2021-02-26: 25 mg via ORAL
  Filled 2021-02-26: qty 1

## 2021-02-26 MED ORDER — ACETAMINOPHEN 325 MG PO TABS
650.0000 mg | ORAL_TABLET | Freq: Once | ORAL | Status: AC
  Administered 2021-02-26: 650 mg via ORAL
  Filled 2021-02-26: qty 2

## 2021-02-26 MED ORDER — JUVEN PO PACK
1.0000 | PACK | Freq: Two times a day (BID) | ORAL | Status: DC
  Administered 2021-02-27 (×2): 1 via ORAL

## 2021-02-26 MED ORDER — ADULT MULTIVITAMIN W/MINERALS CH
1.0000 | ORAL_TABLET | Freq: Every day | ORAL | Status: DC
  Administered 2021-02-27: 1 via ORAL
  Filled 2021-02-26: qty 1

## 2021-02-26 MED ORDER — ADULT MULTIVITAMIN W/MINERALS CH: 1.0000 | ORAL_TABLET | Freq: Every day | ORAL | Status: DC

## 2021-02-26 NOTE — Progress Notes (Signed)
Physical Therapy Treatment Patient Details Name: Jamie Brennan MRN: [id number] DOB: 12/02/1931 Today's Date: 02/26/2021   History of Present Illness Patient is a 88 year old male who fell and suffered a left intertrochanteric hip fracture s/p IM nail. Past medical history significant for prostate cancer, stage IIIa chronic kidney disease, and duodenitis    PT Comments    Patient received in bed, he is agreeable to PT session. Blood transfusion is complete. Patient required mod assist for supine to sit. Assist to scoot hips forward to edge of bed in sitting. Poor sitting balance with posterior leaning.  Patient required mod assist for sit to stand from elevated bed. Shaky in standing and found to be very soiled once moved. He was unable to maintain standing for cleaning and kept stating he was going to fall, therefore returned to sitting then supine with mod assist and NT called to room to assist. Patient demonstrating increased weakness this session compared to yesterday. He will continue to benefit from skilled PT while here to improve strength, safety and independence with mobility.        Recommendations for follow up therapy are one component of a multi-disciplinary discharge planning process, led by the attending physician.  Recommendations may be updated based on patient status, additional functional criteria and insurance authorization.  Follow Up Recommendations  Skilled nursing-short term rehab (<3 hours/day)     Assistance Recommended at Discharge Intermittent Supervision/Assistance  Patient can return home with the following A lot of help with walking and/or transfers;A lot of help with bathing/dressing/bathroom   Equipment Recommendations  None recommended by PT    Recommendations for Other Services       Precautions / Restrictions Precautions Precautions: Fall Restrictions Weight Bearing Restrictions: Yes LLE Weight Bearing: Weight bearing as tolerated      Mobility  Bed Mobility Overal bed mobility: Needs Assistance Bed Mobility: Supine to Sit     Supine to sit: Mod assist Sit to supine: Mod assist   General bed mobility comments: requires mod assist to raise trunk and scoot hips to edge of bed. Poor sitting balance this date. Posterior lean    Transfers Overall transfer level: Needs assistance Equipment used: Rolling walker (2 wheels) Transfers: Sit to/from Stand Sit to Stand: From elevated surface, Mod assist           General transfer comment: patient found to be soiled once standing. He was very shaky standing with walker and mod assist. Kept stating he was going to fall. Unable to take steps or stand long enough for me to get him cleaned up in standing.    Ambulation/Gait               General Gait Details: unable this date. Just got transfusion, so not sure if he was weak from that.   Stairs             Wheelchair Mobility    Modified Rankin (Stroke Patients Only)       Balance Overall balance assessment: Needs assistance, History of Falls Sitting-balance support: Feet supported Sitting balance-Leahy Scale: Poor Sitting balance - Comments: posterior lean in sitting. Unable to maintain sitting balance Postural control: Posterior lean Standing balance support: Bilateral upper extremity supported, During functional activity Standing balance-Leahy Scale: Poor Standing balance comment: mod assist to stand briefly at edge of bed. Very shaky.  Cognition Arousal/Alertness: Awake/alert Behavior During Therapy: WFL for tasks assessed/performed Overall Cognitive Status: Within Functional Limits for tasks assessed                                 General Comments: Patient is very pleasant and cooperative        Exercises Other Exercises Other Exercises: L LE exercises in supine: AP, heel slides, hip abd/add, SLR wit assist as needed due to soreness  x 10 reps each    General Comments        Pertinent Vitals/Pain Pain Assessment Pain Assessment: Faces Faces Pain Scale: Hurts little more Pain Location: L hip during mobility Pain Descriptors / Indicators: Discomfort, Sore Pain Intervention(s): Monitored during session, Repositioned    Home Living                          Prior Function            PT Goals (current goals can now be found in the care plan section) Acute Rehab PT Goals Patient Stated Goal: to be able to walk PT Goal Formulation: With patient Time For Goal Achievement: 03/08/21 Potential to Achieve Goals: Fair Progress towards PT goals: Not progressing toward goals - comment (patient with increased weakness this date. Unable to progress)    Frequency    BID      PT Plan Current plan remains appropriate    Co-evaluation              AM-PAC PT "6 Clicks" Mobility   Outcome Measure  Help needed turning from your back to your side while in a flat bed without using bedrails?: A Lot Help needed moving from lying on your back to sitting on the side of a flat bed without using bedrails?: A Lot Help needed moving to and from a bed to a chair (including a wheelchair)?: A Lot Help needed standing up from a chair using your arms (e.g., wheelchair or bedside chair)?: A Lot Help needed to walk in hospital room?: Total Help needed climbing 3-5 steps with a railing? : Total 6 Click Score: 10    End of Session Equipment Utilized During Treatment: Gait belt Activity Tolerance: Patient limited by fatigue;Patient limited by lethargy Patient left: in bed;with call bell/phone within reach;with bed alarm set;Other (comment) (NT called to get patient cleaned in bed) Nurse Communication: Mobility status PT Visit Diagnosis: Muscle weakness (generalized) (M62.81);Difficulty in walking, not elsewhere classified (R26.2);Unsteadiness on feet (R26.81);History of falling (Z91.81);Other abnormalities of gait and  mobility (R26.89) Pain - Right/Left: Left Pain - part of body: Hip     Time: 1419-1435 PT Time Calculation (min) (ACUTE ONLY): 16 min  Charges:  $Therapeutic Activity: 8-22 mins                     Alese Furniss, PT, GCS 02/26/21,2:51 PM

## 2021-02-26 NOTE — Plan of Care (Signed)

## 2021-02-26 NOTE — Consult Note (Addendum)
WOC Nurse Consult Note: Reason for Consult: Left hip incision with drainage. Orthopedic Surgeon M. Crawford requests placement of incisional NPWT. Wound type: Surgical Pressure Injury POA: N/A Measurement: Proximal incision measures 5cm with 10 staples in close approximation.  Wound bed:N/A Drainage (amount, consistency, odor) Dressings saturated with light yellow exudate. Periwound: No periwound edema or fluctuance. Dressing procedure/placement/frequency: The periwound skin is protected with drape and a piece of Mepitel nonadherent silicone is placed over the staple line. A size appropriate piece of black foam is placed on top of the Mepitel and secured with drape. The dressing is attached to 167mmHg continuous negative pressure and an immediate seal is achieved. The tubing is labeled with an indication that the dressing is not to be changed until discharge and the # and type of wound contact layers. The distal wounds are approximated with staples and are dry and intact. I replace the gauze dressings to these areas and secure with paper tape. Pressure injury prevention interventions are to be implemented today including turning and repositioning, Prevalon heel pressure redistribution heel boots and a sacral silicone foam dressing for PI prophylaxis.  [place] nursing team will not follow, but will remain available to this patient, the nursing and medical teams.  Please re-consult if needed. Thanks, Maudie Flakes, MSN, RN, [place], Arther Abbott  Pager# [phone]

## 2021-02-26 NOTE — Progress Notes (Signed)
Nutrition Follow-up  DOCUMENTATION CODES:   Not applicable  INTERVENTION:   -D/c Ensure -MVI with minerals daily -1 packet Juven BID, each packet provides 95 calories, 2.5 grams of protein (collagen), and 9.8 grams of carbohydrate (3 grams sugar); also contains 7 grams of L-arginine and L-glutamine, 300 mg vitamin C, 15 mg vitamin E, 1.2 mcg vitamin B-12, 9.5 mg zinc, 200 mg calcium, and 1.5 g  Calcium Beta-hydroxy-Beta-methylbutyrate to support wound healing   NUTRITION DIAGNOSIS:   Increased nutrient needs related to hip fracture, post-op healing as evidenced by estimated needs.  Ongoing  GOAL:   Patient will meet greater than or equal to 90% of their needs  Progressing   MONITOR:   PO intake, Supplement acceptance, Labs, Weight trends, Skin  REASON FOR ASSESSMENT:   Consult Hip fracture protocol  ASSESSMENT:   88 y.o. male with medical history of prostate cancer, stage 3 CKD, and duodenitis. He presented to the ED after a fall that resulted from him losing his balance. He had subsequent L hip pain and inability to ambulate.  2/11- s/p PROCEDURE:  LEFT INTRAMEDULLARY (IM) NAIL INTERTROCHANTRIC 2/16- wound vac applied to lt hip  Reviewed I/O's: +620 ml x 24 hours and +587 ml since admission  UOP: 1.3 L x 24 hours   Pt remains with good appetite. Noted meal completions 50-100%. Pt is refusing Ensure supplements.   Per TOC notes, plan for SNF once medically stable.   Medications reviewed and include vitamin B-12 and colace.   Labs reviewed.   Diet Order:   Diet Order             Diet regular Room service appropriate? Yes; Fluid consistency: Thin  Diet effective now                   EDUCATION NEEDS:   Education needs have been addressed  Skin:  Skin Assessment: Skin Integrity Issues: Skin Integrity Issues:: Incisions, Wound VAC Wound Vac: lt thigh Incisions: L hip (2/11)  Last BM:  02/26/21  Height:   Ht Readings from Last 1 Encounters:   02/20/21 5\' 10"  (1.778 m)    Weight:   Wt Readings from Last 1 Encounters:  02/26/21 65.2 kg    Ideal Body Weight:  75.45 kg  BMI:  Body mass index is 20.62 kg/m.  Estimated Nutritional Needs:   Kcal:  1700-1900  Protein:  85-100 grams  Fluid:  > 1.7 L    Loistine Chance, RD, LDN, [place] Registered Dietitian II Certified Diabetes Care and Education Specialist Please refer to [facility] for RD and/or RD on-call/weekend/after hours pager

## 2021-02-26 NOTE — Progress Notes (Signed)
Physical Therapy Treatment Patient Details Name: Jamie Brennan MRN: [id number] DOB: May 15, 1931 Today's Date: 02/26/2021   History of Present Illness Patient is a 88 year old male who fell and suffered a left intertrochanteric hip fracture s/p IM nail. Past medical history significant for prostate cancer, stage IIIa chronic kidney disease, and duodenitis    PT Comments    Patient received in bed has low Hgb this morning of 7.4, will be getting blood transfusion. He now has hemovac to L hip surgical site. Patient wants to remain in bed for transfusion but is agreeable to bed level exercises. He required assistance with exercises on L LE due to pain and weakness. Patient will continue to benefit from skilled PT while here to improve functional independence, strength and safety with mobility.      Recommendations for follow up therapy are one component of a multi-disciplinary discharge planning process, led by the attending physician.  Recommendations may be updated based on patient status, additional functional criteria and insurance authorization.  Follow Up Recommendations  Skilled nursing-short term rehab (<3 hours/day)     Assistance Recommended at Discharge Intermittent Supervision/Assistance  Patient can return home with the following A little help with bathing/dressing/bathroom;A lot of help with walking and/or transfers   Equipment Recommendations  None recommended by PT    Recommendations for Other Services       Precautions / Restrictions Precautions Precautions: Fall Restrictions Weight Bearing Restrictions: Yes LLE Weight Bearing: Weight bearing as tolerated     Mobility  Bed Mobility               General bed mobility comments: patient to get unit of blood and wants to stay in bed until that is done. Deferred for now.    Transfers                        Ambulation/Gait                   Stairs             Wheelchair  Mobility    Modified Rankin (Stroke Patients Only)       Balance                                            Cognition Arousal/Alertness: Awake/alert Behavior During Therapy: WFL for tasks assessed/performed Overall Cognitive Status: Within Functional Limits for tasks assessed                                 General Comments: Patient is very pleasant and cooperative        Exercises Other Exercises Other Exercises: L LE exercises in supine: AP, heel slides, hip abd/add, SLR wit assist as needed due to soreness x 10 reps each    General Comments        Pertinent Vitals/Pain Pain Assessment Pain Assessment: Faces Faces Pain Scale: Hurts little more Pain Location: L hip during mobility Pain Descriptors / Indicators: Discomfort, Sore Pain Intervention(s): Monitored during session, Repositioned    Home Living                          Prior Function            PT  Goals (current goals can now be found in the care plan section) Acute Rehab PT Goals Patient Stated Goal: to be able to walk PT Goal Formulation: With patient Time For Goal Achievement: 03/08/21 Potential to Achieve Goals: Good Progress towards PT goals: Progressing toward goals    Frequency    BID      PT Plan Current plan remains appropriate    Co-evaluation              AM-PAC PT "6 Clicks" Mobility   Outcome Measure  Help needed turning from your back to your side while in a flat bed without using bedrails?: A Little Help needed moving from lying on your back to sitting on the side of a flat bed without using bedrails?: A Little Help needed moving to and from a bed to a chair (including a wheelchair)?: A Lot Help needed standing up from a chair using your arms (e.g., wheelchair or bedside chair)?: A Little Help needed to walk in hospital room?: A Lot Help needed climbing 3-5 steps with a railing? : Total 6 Click Score: 14    End of  Session   Activity Tolerance: Patient limited by pain Patient left: in bed;with call bell/phone within reach Nurse Communication: Mobility status PT Visit Diagnosis: Muscle weakness (generalized) (M62.81);Difficulty in walking, not elsewhere classified (R26.2);Unsteadiness on feet (R26.81);History of falling (Z91.81);Other abnormalities of gait and mobility (R26.89) Pain - Right/Left: Left Pain - part of body: Hip     Time: 1007-1016 PT Time Calculation (min) (ACUTE ONLY): 9 min  Charges:  $Therapeutic Exercise: 8-22 mins                     Kendan Cornforth, PT, GCS 02/26/21,10:40 AM

## 2021-02-26 NOTE — TOC Progression Note (Addendum)
Transition of Care [facility]) - Progression Note    Patient Details  Name: Jamie Brennan MRN: [id number] Date of Birth: [birth year]  Transition of Care [facility]) CM/SW [place], RN Phone Number: 02/26/2021, 9:51 AM  Clinical Narrative:   [facility] obtained Single Case agreement and the patient may DC to [facility] twhen medically ready, Ins expires tomorrow    Expected Discharge Plan: [place] Barriers to Discharge: Continued Medical Work up  Expected Discharge Plan and Services Expected Discharge Plan: [place]   Discharge Planning Services: CM Consult   Living arrangements for the past 2 months:  (with Niece past STR in [place] after a fall in August 2022)                                       Social Determinants of Health (SDOH) Interventions    Readmission Risk Interventions No flowsheet data found.

## 2021-02-26 NOTE — Progress Notes (Signed)
PROGRESS NOTE    Jamie Brennan  ZWC:[id number] DOB: May 28, 1931 DOA: 02/20/2021 PCP: System, Provider Not In    Chief Complaint  Patient presents with   Fall    Brief Narrative:  Patient is a 88 year old gentleman, history of prostate cancer, stage IIIa CKD, duodenitis presented with left hip pain after mechanical fall.  Patient denied any presyncope or syncopal episodes.  Patient seen in the ED imaging/CT scan revealed intertrochanteric left hip fracture.  Patient seen in consultation by orthopedics.  Patient subsequently underwent left IM nail to the left intertrochanteric.  Patient noted to have increased drainage around wound/operative site.  PT OT assessed patient.  Patient also noted to have a post up acute blood loss anemia as well.  Patient seen by PT/OT who are recommending SNF   Assessment & Plan:   Principal Problem:   Closed left hip fracture ([place]) Active Problems:   Acute postoperative anemia due to expected blood loss   Stage 3a chronic kidney disease (CKD) (HCC)   Vitamin B12 deficiency  #1 closed left hip fracture -Secondary to mechanical fall. -Status post IM nail to left hip intertrochanteric. -Patient noted to have serosanguineous drainage around wound site and assessed by orthopedics who are recommending assessment by wound care to assess for incisional wound VAC to be applied to keep incision dry. -Dressing changes recommended by orthopedics. -Weightbearing as tolerated left lower extremity. -Orthopedics recommending prophylactic antibiotic and and as such we will place patient on doxycycline and Omnicef due to chronic kidney disease and as such we will hold off on Bactrim DS. -Patient seen by wound care RN this morning and wound VAC placed. -PT/OT. -Likely needs SNF -Per orthopedics.  2.  Acute postop anemia/acute blood loss anemia/anemia of chronic disease -Hemoglobin currently at 7.4 this morning. -Patient with no overt GI bleed. -Transfuse 1 unit  packed red blood cells. -Saline lock IV fluids. -Transfusion threshold hemoglobin < 7.  3.  CKD stage IIIa -Stable. -Saline lock IV fluids.  4.  Vitamin B12 deficiency -Vitamin B12 levels at 124 -Continue vitamin B12 1000 mcg subcutaneously daily x6 days, then weekly x1 month, then monthly.  -Could also consider oral vitamin B12 supplementation on discharge.   DVT prophylaxis: SCDs, per orthopedics Code Status: DNR Family Communication: Updated patient.  No family at bedside. Disposition:   Status is: Inpatient Remains inpatient appropriate because: Severity of illness           Consultants:  Orthopedics: Dr. Sharlet Salina 02/21/2021  Procedures:  CT pelvis 02/20/2021 CT head 02/20/2021 CT C-spine 02/20/2021 Chest x-ray 02/20/2021 Plain films of the left hip and pelvis 02/20/2021, 02/21/2021 Left IM nail intertrochanteric.  Dr. Sharlet Salina orthopedics 02/21/2021 Transfusion 1 unit packed red blood cells 02/26/2021  Antimicrobials:  Doxycycline 02/25/2021>>> 03/04/2021 Omnicef 02/25/2021>>>> 03/04/2021   Subjective: Sitting up in bed.  Receiving transfusion of 1 unit packed red blood cells.  Wound VAC placed over left hip this morning per wound care RN.  Patient denies any chest pain.  No shortness of breath.  No abdominal pain.  Patient denies any lightheadedness or dizziness.    Objective: Vitals:   02/26/21 1018 02/26/21 1040 02/26/21 1045 02/26/21 1141  BP: 115/65 115/62 115/62 119/62  Pulse: 69 66 66 64  Resp: 15   18  Temp: 98.1 F (36.7 C) 98 F (36.7 C) 98 F (36.7 C) 98.3 F (36.8 C)  TempSrc:      SpO2: 100% 100% 100% 98%  Weight:      Height:  Intake/Output Summary (Last 24 hours) at 02/26/2021 1243 Last data filed at 02/26/2021 1014 Gross per 24 hour  Intake 1919.86 ml  Output 1300 ml  Net 619.86 ml    Filed Weights   02/24/21 0500 02/25/21 0500 02/26/21 0533  Weight: 64.4 kg 63.3 kg 65.2 kg    Examination:  General exam: NAD. Respiratory  system: CTA B.  No wheezes, no crackles, no rhonchi.  Normal respiratory effort.  No use of accessory muscles of respiration.  Speaking in full sentences.   Cardiovascular system: Regular rate rhythm no murmurs rubs or gallops.  No JVD.  No lower extremity edema.  Gastrointestinal system: Abdomen is nondistended, soft and nontender. No organomegaly or masses felt. Normal bowel sounds heard. Central nervous system: Alert and oriented. No focal neurological deficits. Extremities: Wound VAC to left hip.   Skin: No rashes, lesions or ulcers Psychiatry: Judgement and insight appear normal. Mood & affect appropriate.     Data Reviewed: I have personally reviewed following labs and imaging studies  CBC: Recent Labs  Lab 02/20/21 1920 02/21/21 0514 02/23/21 0605 02/24/21 0625 02/25/21 0559 02/25/21 1427 02/26/21 0527  WBC 8.7 9.1 9.6 8.6 8.1  --  7.7  NEUTROABS 7.2  --   --   --   --   --  5.5  HGB 12.2* 9.8* 7.9* 8.8* 7.5* 8.5* 7.4*  HCT 37.2* 29.9* 24.5* 25.6* 23.3* 26.2* 22.7*  MCV 92.8 92.3 92.5 93.8 92.5  --  92.7  PLT 286 238 214 268 277  --  299     Basic Metabolic Panel: Recent Labs  Lab 02/21/21 0514 02/23/21 0605 02/24/21 0625 02/25/21 0559 02/26/21 0527  NA 138 134* 137 134* 136  K 4.2 4.2 4.0 4.4 4.5  CL 106 102 106 105 108  CO2 21* 22 22 22 22   GLUCOSE 126* 112* 105* 108* 106*  BUN 35* 53* 57* 60* 63*  CREATININE 1.46* 1.70* 1.50* 1.73* 1.60*  CALCIUM 8.5* 8.2* 8.4* 8.0* 8.2*  MG  --   --   --   --  2.0     GFR: Estimated Creatinine Clearance: 30 mL/min (A) (by C-G formula based on SCr of 1.6 mg/dL (H)).  Liver Function Tests: Recent Labs  Lab 02/20/21 1920  AST 28  ALT 30  ALKPHOS 97  BILITOT 0.7  PROT 8.2*  ALBUMIN 4.3     CBG: No results for input(s): GLUCAP in the last 168 hours.   Recent Results (from the past 240 hour(s))  Resp Panel by RT-PCR (Flu A&B, Covid) Nasopharyngeal Swab     Status: None   Collection Time: 02/20/21  7:20 PM    Specimen: Nasopharyngeal Swab; Nasopharyngeal(NP) swabs in vial transport medium  Result Value Ref Range Status   SARS Coronavirus 2 by RT PCR NEGATIVE NEGATIVE Final    Comment: (NOTE) SARS-CoV-2 target nucleic acids are NOT DETECTED.  The SARS-CoV-2 RNA is generally detectable in upper respiratory specimens during the acute phase of infection. The lowest concentration of SARS-CoV-2 viral copies this assay can detect is 138 copies/mL. A negative result does not preclude SARS-Cov-2 infection and should not be used as the sole basis for treatment or other patient management decisions. A negative result may occur with  improper specimen collection/handling, submission of specimen other than nasopharyngeal swab, presence of viral mutation(s) within the areas targeted by this assay, and inadequate number of viral copies(<138 copies/mL). A negative result must be combined with clinical observations, patient history, and epidemiological information. The  expected result is Negative.  Fact Sheet for Patients:  EntrepreneurPulse.com.au  Fact Sheet for Healthcare Providers:  IncredibleEmployment.be  This test is no t yet approved or cleared by the [place] FDA and  has been authorized for detection and/or diagnosis of SARS-CoV-2 by FDA under an Emergency Use Authorization (EUA). This EUA will remain  in effect (meaning this test can be used) for the duration of the COVID-19 declaration under Section 564(b)(1) of the Act, 21 U.S.C.section 360bbb-3(b)(1), unless the authorization is terminated  or revoked sooner.       Influenza A by PCR NEGATIVE NEGATIVE Final   Influenza B by PCR NEGATIVE NEGATIVE Final    Comment: (NOTE) The Xpert Xpress SARS-CoV-2/FLU/RSV plus assay is intended as an aid in the diagnosis of influenza from Nasopharyngeal swab specimens and should not be used as a sole basis for treatment. Nasal washings and aspirates are  unacceptable for Xpert Xpress SARS-CoV-2/FLU/RSV testing.  Fact Sheet for Patients: EntrepreneurPulse.com.au  Fact Sheet for Healthcare Providers: IncredibleEmployment.be  This test is not yet approved or cleared by the [place] FDA and has been authorized for detection and/or diagnosis of SARS-CoV-2 by FDA under an Emergency Use Authorization (EUA). This EUA will remain in effect (meaning this test can be used) for the duration of the COVID-19 declaration under Section 564(b)(1) of the Act, 21 U.S.C. section 360bbb-3(b)(1), unless the authorization is terminated or revoked.  Performed at [facility], [address], [place], [place] [address]   Surgical PCR screen     Status: None   Collection Time: 02/20/21 11:42 PM   Specimen: Nasal Mucosa; Nasal Swab  Result Value Ref Range Status   MRSA, PCR NEGATIVE NEGATIVE Final   Staphylococcus aureus NEGATIVE NEGATIVE Final    Comment: (NOTE) The Xpert SA Assay (FDA approved for NASAL specimens in patients 30 years of age and older), is one component of a comprehensive surveillance program. It is not intended to diagnose infection nor to guide or monitor treatment. Performed at [facility], [address] [place], [place] [address]           Radiology Studies: No results found.      Scheduled Meds:  cefdinir  300 mg Oral Q12H   Chlorhexidine Gluconate Cloth  6 each Topical Daily   cyanocobalamin  1,000 mcg Subcutaneous q1800   docusate sodium  200 mg Oral BID   doxycycline  100 mg Oral Q12H   feeding supplement  237 mL Oral BID BM   Continuous Infusions:  sodium chloride 10 mL/hr (02/21/21 2156)     LOS: 6 days    Time spent: 40 minutes    Irine Seal, MD [facility]   To contact the attending provider between 7A-7P or the covering provider during after hours 7P-7A, please log into the web site [email or web address] and access using  universal [place] password for that web site. If you do not have the password, please call the hospital operator.  02/26/2021, 12:43 PM

## 2021-02-27 ENCOUNTER — Encounter: Payer: Self-pay | Admitting: Orthopaedic Surgery

## 2021-02-27 LAB — TYPE AND SCREEN
ABO/RH(D): O POS
Antibody Screen: NEGATIVE
Unit division: 0

## 2021-02-27 LAB — BPAM RBC
Blood Product Expiration Date: 202303212359
ISSUE DATE / TIME: 202302161020
Unit Type and Rh: 5100

## 2021-02-27 LAB — CBC WITH DIFFERENTIAL/PLATELET
Abs Immature Granulocytes: 0.02 10*3/uL (ref 0.00–0.07)
Basophils Absolute: 0 10*3/uL (ref 0.0–0.1)
Basophils Relative: 1 %
Eosinophils Absolute: 0.2 10*3/uL (ref 0.0–0.5)
Eosinophils Relative: 2 %
HCT: 26.7 % — ABNORMAL LOW (ref 39.0–52.0)
Hemoglobin: 8.7 g/dL — ABNORMAL LOW (ref 13.0–17.0)
Immature Granulocytes: 0 %
Lymphocytes Relative: 8 %
Lymphs Abs: 0.7 10*3/uL (ref 0.7–4.0)
MCH: 29.4 pg (ref 26.0–34.0)
MCHC: 32.6 g/dL (ref 30.0–36.0)
MCV: 90.2 fL (ref 80.0–100.0)
Monocytes Absolute: 1 10*3/uL (ref 0.1–1.0)
Monocytes Relative: 12 %
Neutro Abs: 6.2 10*3/uL (ref 1.7–7.7)
Neutrophils Relative %: 77 %
Platelets: 319 10*3/uL (ref 150–400)
RBC: 2.96 MIL/uL — ABNORMAL LOW (ref 4.22–5.81)
RDW: 13.5 % (ref 11.5–15.5)
WBC: 8.1 10*3/uL (ref 4.0–10.5)
nRBC: 0 % (ref 0.0–0.2)

## 2021-02-27 LAB — BASIC METABOLIC PANEL
Anion gap: 6 (ref 5–15)
BUN: 58 mg/dL — ABNORMAL HIGH (ref 8–23)
CO2: 23 mmol/L (ref 22–32)
Calcium: 8.3 mg/dL — ABNORMAL LOW (ref 8.9–10.3)
Chloride: 108 mmol/L (ref 98–111)
Creatinine, Ser: 1.51 mg/dL — ABNORMAL HIGH (ref 0.61–1.24)
GFR, Estimated: 44 mL/min — ABNORMAL LOW (ref >60)
Glucose, Bld: 109 mg/dL — ABNORMAL HIGH (ref 70–99)
Potassium: 4.4 mmol/L (ref 3.5–5.1)
Sodium: 137 mmol/L (ref 135–145)

## 2021-02-27 LAB — RESP PANEL BY RT-PCR (FLU A&B, COVID) ARPGX2
Influenza A by PCR: NEGATIVE
Influenza B by PCR: NEGATIVE
SARS Coronavirus 2 by RT PCR: NEGATIVE

## 2021-02-27 MED ORDER — POLYETHYLENE GLYCOL 3350 17 G PO PACK: 17.0000 g | PACK | Freq: Every day | ORAL | 0 refills | Status: DC | PRN

## 2021-02-27 MED ORDER — DOCUSATE SODIUM 100 MG PO CAPS: 200.0000 mg | ORAL_CAPSULE | Freq: Two times a day (BID) | ORAL | 0 refills | Status: AC

## 2021-02-27 MED ORDER — HYDROCODONE-ACETAMINOPHEN 5-325 MG PO TABS: 1.0000 | ORAL_TABLET | Freq: Four times a day (QID) | ORAL | 0 refills | Status: DC | PRN

## 2021-02-27 MED ORDER — DOXYCYCLINE HYCLATE 100 MG PO TABS: 100.0000 mg | ORAL_TABLET | Freq: Two times a day (BID) | ORAL | 0 refills | Status: DC

## 2021-02-27 MED ORDER — ACETAMINOPHEN 500 MG PO TABS: 500.0000 mg | ORAL_TABLET | Freq: Four times a day (QID) | ORAL | 0 refills | Status: AC | PRN

## 2021-02-27 MED ORDER — VITAMIN B-12 1000 MCG PO TABS: 1000.0000 ug | ORAL_TABLET | Freq: Every day | ORAL | Status: AC

## 2021-02-27 MED ORDER — TRAZODONE HCL 50 MG PO TABS: 25.0000 mg | ORAL_TABLET | Freq: Every evening | ORAL | 0 refills | Status: AC | PRN

## 2021-02-27 MED ORDER — CEFDINIR 300 MG PO CAPS: 300.0000 mg | ORAL_CAPSULE | Freq: Two times a day (BID) | ORAL | 0 refills | Status: DC

## 2021-02-27 NOTE — Progress Notes (Signed)
Vitals entered manually- [employer] didn't send over

## 2021-02-27 NOTE — Progress Notes (Signed)
Physical Therapy Treatment Patient Details Name: MARLENE PFLUGER MRN: [id number] DOB: Nov 01, 1931 Today's Date: 02/27/2021   History of Present Illness Patient is a 88 year old male who fell and suffered a left intertrochanteric hip fracture s/p IM nail. Past medical history significant for prostate cancer, stage IIIa chronic kidney disease, and duodenitis    PT Comments    Pt seen this am following OT session, received in bed and agreeable to activity. Pt required Mod/MinA for transfers, tolerated gait training with RW, 42ft, with CGA and repeated cues for sequencing/weight shifting. Pt pleased with progression, c/o 4/10 pain which he feels has improved as well. Will continue to progress gait this pm. Continue to recommend short term stay at SNF once medically stable.    Recommendations for follow up therapy are one component of a multi-disciplinary discharge planning process, led by the attending physician.  Recommendations may be updated based on patient status, additional functional criteria and insurance authorization.  Follow Up Recommendations  Skilled nursing-short term rehab (<3 hours/day)     Assistance Recommended at Discharge Intermittent Supervision/Assistance  Patient can return home with the following A little help with walking and/or transfers   Equipment Recommendations  None recommended by PT    Recommendations for Other Services       Precautions / Restrictions Precautions Precautions: Fall Precaution Comments: provided post-op hip fracture HEP Restrictions Weight Bearing Restrictions: Yes LLE Weight Bearing: Weight bearing as tolerated     Mobility  Bed Mobility Overal bed mobility: Needs Assistance Bed Mobility: Supine to Sit     Supine to sit: Min assist, Mod assist, HOB elevated          Transfers Overall transfer level: Needs assistance Equipment used: Rolling walker (2 wheels) Transfers: Sit to/from Stand Sit to Stand: From elevated  surface, Min assist           General transfer comment: ModA to raise from lower recliner    Ambulation/Gait Ambulation/Gait assistance: Min Web designer (Feet): 15 Feet Assistive device: Rolling walker (2 wheels) Gait Pattern/deviations: Step-to pattern, Decreased stance time - left, Narrow base of support       General Gait Details: repeated vc's for sequencing with RW and weight shifting   Stairs             Wheelchair Mobility    Modified Rankin (Stroke Patients Only)       Balance                                            Cognition Arousal/Alertness: Awake/alert Behavior During Therapy: WFL for tasks assessed/performed Overall Cognitive Status: Within Functional Limits for tasks assessed                                 General Comments: Patient is very pleasant and cooperative        Exercises General Exercises - Lower Extremity Ankle Circles/Pumps: AROM, Strengthening, Both, 10 reps, Seated Heel Slides: AAROM, Left, AROM, Right Hip ABduction/ADduction: AAROM, Strengthening, Left, 10 reps, Seated    General Comments General comments (skin integrity, edema, etc.): L hip dressing intact, spoke with nursing re: purewick still in place      Pertinent Vitals/Pain Pain Assessment Pain Assessment: 0-10 Pain Score: 4  Pain Location: L hip during mobility Pain Descriptors / Indicators:  Discomfort, Sore Pain Intervention(s): Monitored during session    Home Living                          Prior Function            PT Goals (current goals can now be found in the care plan section) Acute Rehab PT Goals Patient Stated Goal: to be able to walk Progress towards PT goals: Progressing toward goals    Frequency    BID      PT Plan Current plan remains appropriate    Co-evaluation              AM-PAC PT "6 Clicks" Mobility   Outcome Measure  Help needed turning from your back to  your side while in a flat bed without using bedrails?: A Lot Help needed moving from lying on your back to sitting on the side of a flat bed without using bedrails?: A Lot Help needed moving to and from a bed to a chair (including a wheelchair)?: A Little Help needed standing up from a chair using your arms (e.g., wheelchair or bedside chair)?: A Lot Help needed to walk in hospital room?: A Little Help needed climbing 3-5 steps with a railing? : Total 6 Click Score: 13    End of Session Equipment Utilized During Treatment: Gait belt Activity Tolerance: Patient limited by fatigue;Patient limited by lethargy Patient left: with call bell/phone within reach;in chair;with chair alarm set Nurse Communication: Mobility status PT Visit Diagnosis: Muscle weakness (generalized) (M62.81);Difficulty in walking, not elsewhere classified (R26.2);Unsteadiness on feet (R26.81);History of falling (Z91.81);Other abnormalities of gait and mobility (R26.89) Pain - Right/Left: Left Pain - part of body: Hip     Time: 6301-6010 PT Time Calculation (min) (ACUTE ONLY): 55 min  Charges:  $Gait Training: 8-22 mins $Therapeutic Exercise: 8-22 mins $Therapeutic Activity: 23-37 mins                    Mikel Cella, PTA    Josie Dixon 02/27/2021, 10:30 AM

## 2021-02-27 NOTE — Care Management Important Message (Signed)
Important Message  Patient Details  Name: FREDERICK MARRO MRN: [id number] Date of Birth: 01-16-1931   Medicare Important Message Given:  Yes     [name] 02/27/2021, 11:18 AM

## 2021-02-27 NOTE — TOC Progression Note (Addendum)
Transition of Care [facility]) - Progression Note    Patient Details  Name: Jamie Brennan MRN: [id number] Date of Birth: 04/10/1931  Transition of Care [facility]) CM/SW [place], RN Phone Number: 02/27/2021, 12:41 PM  Clinical Narrative:    The patient's Ins auth expired, I resubmitted for New Ins approval and spoke to the Case mgr at [facility] ID [id number]  Expected Discharge Plan: [place] Barriers to Discharge: Continued Medical Work up  Expected Discharge Plan and Services Expected Discharge Plan: [place]   Discharge Planning Services: CM Consult   Living arrangements for the past 2 months:  (with Niece past STR in [place] after a fall in August 2022)                                       Social Determinants of Health (SDOH) Interventions    Readmission Risk Interventions No flowsheet data found.

## 2021-02-27 NOTE — TOC Progression Note (Signed)
Transition of Care [facility]) - Progression Note    Patient Details  Name: Jamie Brennan MRN: [id number] Date of Birth: August 12, 1931  Transition of Care [facility]) CM/SW [place], RN Phone Number: 02/27/2021, 4:15 PM  Clinical Narrative:   Yates [place] Niece to let her know that the patient will go to [facility] today, EMS called . The bedside nurse aware and he will go to room 305B    Expected Discharge Plan: [place] Barriers to Discharge: Continued Medical Work up  Expected Discharge Plan and Services Expected Discharge Plan: [place]   Discharge Planning Services: CM Consult   Living arrangements for the past 2 months:  (with Niece past STR in [place] after a fall in August 2022) Expected Discharge Date: 02/27/21                                     Social Determinants of Health (SDOH) Interventions    Readmission Risk Interventions No flowsheet data found.

## 2021-02-27 NOTE — TOC Progression Note (Addendum)
Transition of Care [facility]) - Progression Note    Patient Details  Name: Jamie Brennan MRN: [id number] Date of Birth: [birth year]  Transition of Care [facility]) CM/SW [place], RN Phone Number: 02/27/2021, 4:02 PM  Clinical Narrative:   [facility] will accept the patient on the weekend if Ins Josem Kaufmann is approved, currently still pending, call or text her at [phone] to arrange  Auth approved will go to [facility] today, EMS called   Expected Discharge Plan: [place] Barriers to Discharge: Continued Medical Work up  Expected Discharge Plan and Services Expected Discharge Plan: [place]   Discharge Planning Services: CM Consult   Living arrangements for the past 2 months:  (with Niece past STR in [place] after a fall in August 2022) Expected Discharge Date: 02/27/21                                     Social Determinants of Health (SDOH) Interventions    Readmission Risk Interventions No flowsheet data found.

## 2021-02-27 NOTE — Progress Notes (Signed)
Occupational Therapy Treatment Patient Details Name: Jamie Brennan MRN: [id number] DOB: 05/03/1931 Today's Date: 02/27/2021   History of present illness Patient is a 88 year old male who fell and suffered a left intertrochanteric hip fracture s/p IM nail. Past medical history significant for prostate cancer, stage IIIa chronic kidney disease, and duodenitis   OT comments  Pt seen for OT tx. Pt received in bed, agreeable to session, and denies pain at rest in L hip. Pt performed bed mobility with MIN A this date for LLE mgt, demonstrating improvement from previous session. Once in sitting pt maintained fair static and dynamic sitting balance while performing grooming tasks EOB with set up and supervision for safety. Pt required less assist this date for cleaning his dentures. Pt stood from EOB requiring MOD A and took a few small shuffled steps requiring VC for increasing distance between feet to improve balance and slight posterior lean. Pt left in bed, all needs in reach. Pt demo'd progress this date towards goals, and continues to benefit from skilled OT services to maximize return to PLOF. SNF remains appropriate.    Recommendations for follow up therapy are one component of a multi-disciplinary discharge planning process, led by the attending physician.  Recommendations may be updated based on patient status, additional functional criteria and insurance authorization.    Follow Up Recommendations  Skilled nursing-short term rehab (<3 hours/day)    Assistance Recommended at Discharge Intermittent Supervision/Assistance  Patient can return home with the following  A lot of help with walking and/or transfers;A lot of help with bathing/dressing/bathroom;Assistance with cooking/housework;Assist for transportation;Help with stairs or ramp for entrance;Direct supervision/assist for medications management   Equipment Recommendations  BSC/3in1;Other (comment) (2WW)    Recommendations for Other  Services      Precautions / Restrictions Precautions Precautions: Fall Precaution Comments: provided post-op hip fracture HEP Restrictions Weight Bearing Restrictions: Yes LLE Weight Bearing: Weight bearing as tolerated       Mobility Bed Mobility Overal bed mobility: Needs Assistance Bed Mobility: Supine to Sit     Supine to sit: HOB elevated, Min assist     General bed mobility comments: VC for sequencing, MIN A for LLE mgt    Transfers Overall transfer level: Needs assistance Equipment used: Rolling walker (2 wheels) Transfers: Sit to/from Stand Sit to Stand: Mod assist           General transfer comment: MOD A from EOB and to take several small shuffled side steps, with VC to increase distance between feet to improve balance     Balance Overall balance assessment: Needs assistance, History of Falls Sitting-balance support: Feet supported Sitting balance-Leahy Scale: Fair   Postural control: Posterior lean Standing balance support: Bilateral upper extremity supported, Reliant on assistive device for balance, During functional activity Standing balance-Leahy Scale: Poor Standing balance comment: Min-Mod A for standing and for small shuffled side steps EOB with VC to increase distance between feet for improved balance                           ADL either performed or assessed with clinical judgement   ADL Overall ADL's : Needs assistance/impaired     Grooming: Sitting;Set up;Supervision/safety;Oral care;Wash/dry face Grooming Details (indicate cue type and reason): Pt sat EOB with supervision and set up for cleaning his dentures. Pt demo's improved sitting balance, activity tolerance, and decr assist to complete task overall.  Functional mobility during ADLs: Moderate assistance;Rolling walker (2 wheels)      Extremity/Trunk Assessment              Vision       Perception     Praxis       Cognition Arousal/Alertness: Awake/alert Behavior During Therapy: WFL for tasks assessed/performed Overall Cognitive Status: Within Functional Limits for tasks assessed                                          Exercises      Shoulder Instructions       General Comments L hip dressing intact, spoke with nursing re: purewick still in place    Pertinent Vitals/ Pain       Pain Assessment Pain Assessment: 0-10 Pain Score: 4  Pain Location: L hip with mobility, no pain at rest Pain Descriptors / Indicators: Discomfort, Sore, Aching Pain Intervention(s): Limited activity within patient's tolerance, Monitored during session, Premedicated before session, Repositioned  Home Living                                          Prior Functioning/Environment              Frequency  Min 2X/week        Progress Toward Goals  OT Goals(current goals can now be found in the care plan section)  Progress towards OT goals: Progressing toward goals  Acute Rehab OT Goals Patient Stated Goal: return to home in [place] when safe OT Goal Formulation: With patient Time For Goal Achievement: 03/08/21 Potential to Achieve Goals: Good  Plan Discharge plan remains appropriate;Frequency remains appropriate    Co-evaluation                 AM-PAC OT "6 Clicks" Daily Activity     Outcome Measure   Help from another person eating meals?: None Help from another person taking care of personal grooming?: A Little Help from another person toileting, which includes using toliet, bedpan, or urinal?: A Lot Help from another person bathing (including washing, rinsing, drying)?: A Lot Help from another person to put on and taking off regular upper body clothing?: A Little Help from another person to put on and taking off regular lower body clothing?: A Lot 6 Click Score: 16    End of Session Equipment Utilized During Treatment: Gait belt;Rolling walker (2  wheels)  OT Visit Diagnosis: Unsteadiness on feet (R26.81);History of falling (Z91.81);Pain Pain - Right/Left: Left Pain - part of body: Hip   Activity Tolerance Patient tolerated treatment well   Patient Left in bed;with call bell/phone within reach;with bed alarm set   Nurse Communication          Time: 3664-4034 OT Time Calculation (min): 19 min  Charges: OT General Charges $OT Visit: 1 Visit OT Treatments $Self Care/Home Management : 8-22 mins  Ardeth Perfect., MPH, MS, OTR/L ascom [phone] 02/27/21, 12:08 PM

## 2021-02-27 NOTE — Progress Notes (Signed)
Physical Therapy Treatment Patient Details Name: Jamie Brennan MRN: [id number] DOB: 1931-07-06 Today's Date: 02/27/2021   History of Present Illness Patient is a 88 year old male who fell and suffered a left intertrochanteric hip fracture s/p IM nail. Past medical history significant for prostate cancer, stage IIIa chronic kidney disease, and duodenitis    PT Comments    Pt fatigued for pm session after sitting up in chair for 3 hours. Pt requesting to return to bed, unable to tolerate much walking. Session was focused primarily on transfers and prolonged standing at RW due to incontinence of BM. Pt able to stand for 2 minute increments for hygiene with support of RW and supervision. ModA for  sit to supine primarily for R LE. Pt positioned to comfort with call bell and bed alarm on. Pt made great progress today with mobility and gait training. His tolerance may be stronger initially in the am.    Recommendations for follow up therapy are one component of a multi-disciplinary discharge planning process, led by the attending physician.  Recommendations may be updated based on patient status, additional functional criteria and insurance authorization.  Follow Up Recommendations  Skilled nursing-short term rehab (<3 hours/day)     Assistance Recommended at Discharge Intermittent Supervision/Assistance  Patient can return home with the following A little help with walking and/or transfers   Equipment Recommendations  None recommended by PT    Recommendations for Other Services       Precautions / Restrictions Precautions Precautions: Fall Precaution Comments: provided post-op hip fracture HEP Restrictions Weight Bearing Restrictions: Yes LLE Weight Bearing: Weight bearing as tolerated     Mobility  Bed Mobility Overal bed mobility: Needs Assistance Bed Mobility: Sit to Supine     Supine to sit: Mod assist (for B LE's)     General bed mobility comments:  (Pt very fatigued  after sitting up in chair for 3 hours)    Transfers Overall transfer level: Needs assistance Equipment used: Rolling walker (2 wheels) Transfers: Sit to/from Stand Sit to Stand: Mod assist           General transfer comment: ModA to raise from lower recliner    Ambulation/Gait Ambulation/Gait assistance: Min assist Gait Distance (Feet): 3 Feet Assistive device: Rolling walker (2 wheels) Gait Pattern/deviations: Step-to pattern, Decreased stance time - left, Narrow base of support Gait velocity: decreased     General Gait Details: repeated vc's for sequencing with RW and weight shifting   Stairs             Wheelchair Mobility    Modified Rankin (Stroke Patients Only)       Balance                                            Cognition Arousal/Alertness: Awake/alert Behavior During Therapy: WFL for tasks assessed/performed Overall Cognitive Status: Within Functional Limits for tasks assessed                                 General Comments: Patient is very pleasant and cooperative        Exercises General Exercises - Lower Extremity Ankle Circles/Pumps: Both, AROM, 10 reps Heel Slides: AAROM, Right, 5 reps Hip ABduction/ADduction: AAROM, Strengthening, Left, 10 reps, Seated    General Comments General comments (skin integrity,  edema, etc.): wound vac changed by nursing to portable unit      Pertinent Vitals/Pain Pain Assessment Pain Assessment: 0-10 Pain Score: 4  Pain Location: L hip with mobility, no pain at rest Pain Descriptors / Indicators: Discomfort, Sore, Aching Pain Intervention(s): Limited activity within patient's tolerance    Home Living                          Prior Function            PT Goals (current goals can now be found in the care plan section) Acute Rehab PT Goals Patient Stated Goal: to be able to walk Progress towards PT goals: Progressing toward goals     Frequency    BID      PT Plan Current plan remains appropriate    Co-evaluation              AM-PAC PT "6 Clicks" Mobility   Outcome Measure  Help needed turning from your back to your side while in a flat bed without using bedrails?: A Lot Help needed moving from lying on your back to sitting on the side of a flat bed without using bedrails?: A Lot Help needed moving to and from a bed to a chair (including a wheelchair)?: A Little Help needed standing up from a chair using your arms (e.g., wheelchair or bedside chair)?: A Lot Help needed to walk in hospital room?: A Little Help needed climbing 3-5 steps with a railing? : Total 6 Click Score: 13    End of Session Equipment Utilized During Treatment: Gait belt Activity Tolerance: Patient limited by fatigue;Patient limited by lethargy Patient left: with call bell/phone within reach;in bed;with bed alarm set Nurse Communication: Mobility status PT Visit Diagnosis: Muscle weakness (generalized) (M62.81);Difficulty in walking, not elsewhere classified (R26.2);Unsteadiness on feet (R26.81);History of falling (Z91.81);Other abnormalities of gait and mobility (R26.89) Pain - Right/Left: Left Pain - part of body: Hip     Time: 0045-9977 PT Time Calculation (min) (ACUTE ONLY): 26 min  Charges:  $Therapeutic Activity: 23-37 mins                    Jamie Brennan, PTA    Jamie Brennan 02/27/2021, 4:50 PM

## 2021-02-27 NOTE — Progress Notes (Signed)
Patient has just  been transported to facility by EMS.

## 2021-02-27 NOTE — Progress Notes (Signed)
Jamie Brennan was called and report was given to Smiley, LPN  at ( [phone]), waiting on transport.

## 2021-02-27 NOTE — Discharge Summary (Signed)
Physician Discharge Summary  Jamie Brennan ZOX:[id number] DOB: 01-25-1931 DOA: 02/20/2021  PCP: System, Provider Not In  Admit date: 02/20/2021 Discharge date: 02/27/2021  Time spent: 55 minutes  Recommendations for Outpatient Follow-up:  Patient will be discharged to skilled nursing facility with wound VAC in place. Follow-up with MD at skilled nursing facility.  Patient will need a basic metabolic profile, CBC done in 1 week to follow-up on electrolytes, renal function, hemoglobin. Follow-up with Dr. Sharlet Salina, orthopedics in 2 weeks.   Discharge Diagnoses:  Principal Problem:   Closed left hip fracture ([place]) Active Problems:   Acute postoperative anemia due to expected blood loss   Stage 3a chronic kidney disease (CKD) (H[facility])   Vitamin B12 deficiency   Discharge Condition: Stable and improved  Diet recommendation: Regular  Filed Weights   02/25/21 0500 02/26/21 0533 02/27/21 0606  Weight: 63.3 kg 65.2 kg 64.1 kg    History of present illness:  HPI per Dr. Oneita Hurt Jamie Brennan is a 88 y.o. Caucasian male with medical history significant for prostate cancer, stage IIIa chronic kidney disease, duodenitis, who presented to the ER with acute onset of mechanical fall while getting out of his chair, twisting and losing balance per his report.  He fell on his left side with subsequent left hip pain and inability to ambulate.  He denies any presyncope or syncope, chest pain or palpitations.  Headache or dizziness or blurred vision, paresthesias or focal muscle weakness before or after his fall.  No dysuria, oliguria or hematuria or flank pain.  No cough or wheezing or dyspnea.  No chest pain or palpitations.  ED Course: When he came to the ER, BP was 160/108 with otherwise normal vital signs.  Labs revealed a BUN of 35 and creatinine 1.62 and CBC showed mild anemia with hemoglobin 12.2 hematocrit 37.2.  Involves antigens and COVID-19 PCR came back negative.  MRSA PCR was negative as  Staph aureus was negative as well.     EKG as reviewed by me : EKG showed normal sinus rhythm rate of 81 with poor R wave progression and T wave inversion inferiorly Imaging: Head noncontrasted CT scan revealed prominence of the lateral ventricles that may be related to predominant atrophy although component of normal pressure/communicating hydrocephalus cannot be excluded.  No acute displaced fracture or traumatic listhesis of the cervical spine and multilevel moderate to severe degenerative changes of the spine with associated multilevel severe osseous neuroforaminal stenosis.  Pelvic CT scan revealed intertrochanteric left hip fracture with no pelvic fracture.  The patient was given 50 mcg of IV fentanyl.  He will be admitted to a medical bed for further evaluation and management.  Hospital Course:  #1 closed left hip fracture -Secondary to mechanical fall. -Status post IM nail to left hip intertrochanteric. -Patient noted to have serosanguineous drainage around wound site and assessed by orthopedics who are recommended assessment by wound care to assess for incisional wound VAC to be applied to keep incision dry. -Dressing changes recommended by orthopedics. -Weightbearing as tolerated left lower extremity. -Orthopedics recommended prophylactic antibiotic and and as such patient placed on doxycycline and Omnicef and patient be discharged on 6 more days of oral antibiotics to complete a 7-day course of treatment.   -Patient seen by wound care RN wound VAC placed.   -On day of discharge Prevena wound VAC placed and patient be discharged to skilled nursing facility.   -Outpatient follow-up with orthopedics in 2 weeks.  2.  Acute postop anemia/acute blood  loss anemia/anemia of chronic disease -Hemoglobin noted at 7.4 during the hospitalization postoperatively  -Patient with no overt bleeding noted.   -Patient transfused 1 unit packed red blood cells with hemoglobin stable at 8.7 by day of  discharge.   -Outpatient follow-up.    3.  CKD stage IIIa -Stable.  4.  Vitamin B12 deficiency -Vitamin B12 levels at 124 -Patient received subcutaneous vitamin B12 injections during the hospitalization will be discharged on oral vitamin B12 supplementation.   -We will need repeat vitamin B12 levels done in about 6 to 8 weeks.      Procedures: CT pelvis 02/20/2021 CT head 02/20/2021 CT C-spine 02/20/2021 Chest x-ray 02/20/2021 Plain films of the left hip and pelvis 02/20/2021, 02/21/2021 Left IM nail intertrochanteric.  Dr. Sharlet Salina orthopedics 02/21/2021 Transfusion 1 unit packed red blood cells 02/26/2021  Consultations: Orthopedics: Dr. Sharlet Salina 02/21/2021  Discharge Exam: Vitals:   02/27/21 0411 02/27/21 0841  BP: 130/76 134/73  Pulse: 70 62  Resp: 18 16  Temp: 98 F (36.7 C) 97.7 F (36.5 C)  SpO2: 98% 99%    General: NAD Cardiovascular: Regular rate rhythm no murmurs rubs or gallops.  No JVD.  No lower extremity edema Respiratory: Clear to auscultation bilaterally.  No wheezes, no crackles, no rhonchi.  Discharge Instructions   Discharge Instructions     Diet general   Complete by: As directed    Discharge wound care:   Complete by: As directed    As above.   Increase activity slowly   Complete by: As directed    Place TED hose   Complete by: As directed       Allergies as of 02/27/2021   No Known Allergies      Medication List     STOP taking these medications    mupirocin cream 2 % Commonly known as: BACTROBAN       TAKE these medications    acetaminophen 500 MG tablet Commonly known as: TYLENOL Take 1 tablet (500 mg total) by mouth every 6 (six) hours as needed for mild pain, moderate pain, fever or headache.   cefdinir 300 MG capsule Commonly known as: OMNICEF Take 1 capsule (300 mg total) by mouth every 12 (twelve) hours for 6 days.   docusate sodium 100 MG capsule Commonly known as: COLACE Take 2 capsules (200 mg total) by mouth 2  (two) times daily.   doxycycline 100 MG tablet Commonly known as: VIBRA-TABS Take 1 tablet (100 mg total) by mouth every 12 (twelve) hours for 6 days.   HYDROcodone-acetaminophen 5-325 MG tablet Commonly known as: NORCO/VICODIN Take 1-2 tablets by mouth every 6 (six) hours as needed for moderate pain.   polyethylene glycol 17 g packet Commonly known as: MIRALAX / GLYCOLAX Take 17 g by mouth daily as needed for mild constipation or moderate constipation.   traZODone 50 MG tablet Commonly known as: DESYREL Take 0.5 tablets (25 mg total) by mouth at bedtime as needed for sleep.   vitamin B-12 1000 MCG tablet Commonly known as: CYANOCOBALAMIN Take 1 tablet (1,000 mcg total) by mouth daily.               Discharge Care Instructions  (From admission, onward)           Start     Ordered   02/27/21 0000  Discharge wound care:       Comments: As above.   02/27/21 1242           No Known Allergies  Follow-up Information     Renee Harder, MD. Schedule an appointment as soon as possible for a visit in 2 week(s).   Specialty: Orthopedic Surgery Contact information: [place] [address] [phone]         MD AT SNF Follow up.                   The results of significant diagnostics from this hospitalization (including imaging, microbiology, ancillary and laboratory) are listed below for reference.    Significant Diagnostic Studies: CT HEAD WO CONTRAST (5MM)  Result Date: 02/20/2021 CLINICAL DATA:  Neck trauma (Age >= 65y); Head trauma, minor (Age >= 65y) EXAM: CT HEAD WITHOUT CONTRAST CT CERVICAL SPINE WITHOUT CONTRAST TECHNIQUE: Multidetector CT imaging of the head and cervical spine was performed following the standard protocol without intravenous contrast. Multiplanar CT image reconstructions of the cervical spine were also generated. RADIATION DOSE REDUCTION: This exam was performed according to the departmental  dose-optimization program which includes automated exposure control, adjustment of the mA and/or kV according to patient size and/or use of iterative reconstruction technique. COMPARISON:  None. FINDINGS: CT HEAD FINDINGS BRAIN: BRAIN Prominence of the lateral ventricles may be related to central predominant atrophy, although a component of normal pressure/communicating hydrocephalus cannot be excluded. Patchy and confluent areas of decreased attenuation are noted throughout the deep and periventricular white matter of the cerebral hemispheres bilaterally, compatible with chronic microvascular ischemic disease. No evidence of large-territorial acute infarction. No parenchymal hemorrhage. No mass lesion. No extra-axial collection. No mass effect or midline shift. No hydrocephalus. Basilar cisterns are patent. Vascular: No hyperdense vessel. Skull: No acute fracture or focal lesion. Sinuses/Orbits: Paranasal sinuses and mastoid air cells are clear. Bilateral lens replacement. Otherwise the orbits are unremarkable. Other: None. CT CERVICAL SPINE FINDINGS Alignment: Normal. Skull base and vertebrae: Multilevel moderate to severe degenerative changes of the spine. Associated multilevel severe osseous neural foraminal stenosis. No severe osseous central canal stenosis. No acute fracture. No aggressive appearing focal osseous lesion or focal pathologic process. Soft tissues and spinal canal: No prevertebral fluid or swelling. No visible canal hematoma. Upper chest: Unremarkable. Other: None. IMPRESSION: 1. No acute intracranial abnormality. 2. Prominence of the lateral ventricles may be related to central predominant atrophy, although a component of normal pressure/communicating hydrocephalus cannot be excluded. 3. No acute displaced fracture or traumatic listhesis of the cervical spine. 4. Multilevel moderate to severe degenerative changes of the spine. Associated multilevel severe osseous neural foraminal stenosis.  Electronically Signed   By: Iven Finn M.D.   On: 02/20/2021 20:23   CT Cervical Spine Wo Contrast  Result Date: 02/20/2021 CLINICAL DATA:  Neck trauma (Age >= 65y); Head trauma, minor (Age >= 65y) EXAM: CT HEAD WITHOUT CONTRAST CT CERVICAL SPINE WITHOUT CONTRAST TECHNIQUE: Multidetector CT imaging of the head and cervical spine was performed following the standard protocol without intravenous contrast. Multiplanar CT image reconstructions of the cervical spine were also generated. RADIATION DOSE REDUCTION: This exam was performed according to the departmental dose-optimization program which includes automated exposure control, adjustment of the mA and/or kV according to patient size and/or use of iterative reconstruction technique. COMPARISON:  None. FINDINGS: CT HEAD FINDINGS BRAIN: BRAIN Prominence of the lateral ventricles may be related to central predominant atrophy, although a component of normal pressure/communicating hydrocephalus cannot be excluded. Patchy and confluent areas of decreased attenuation are noted throughout the deep and periventricular white matter of the cerebral hemispheres bilaterally, compatible with chronic microvascular ischemic disease.  No evidence of large-territorial acute infarction. No parenchymal hemorrhage. No mass lesion. No extra-axial collection. No mass effect or midline shift. No hydrocephalus. Basilar cisterns are patent. Vascular: No hyperdense vessel. Skull: No acute fracture or focal lesion. Sinuses/Orbits: Paranasal sinuses and mastoid air cells are clear. Bilateral lens replacement. Otherwise the orbits are unremarkable. Other: None. CT CERVICAL SPINE FINDINGS Alignment: Normal. Skull base and vertebrae: Multilevel moderate to severe degenerative changes of the spine. Associated multilevel severe osseous neural foraminal stenosis. No severe osseous central canal stenosis. No acute fracture. No aggressive appearing focal osseous lesion or focal pathologic  process. Soft tissues and spinal canal: No prevertebral fluid or swelling. No visible canal hematoma. Upper chest: Unremarkable. Other: None. IMPRESSION: 1. No acute intracranial abnormality. 2. Prominence of the lateral ventricles may be related to central predominant atrophy, although a component of normal pressure/communicating hydrocephalus cannot be excluded. 3. No acute displaced fracture or traumatic listhesis of the cervical spine. 4. Multilevel moderate to severe degenerative changes of the spine. Associated multilevel severe osseous neural foraminal stenosis. Electronically Signed   By: Iven Finn M.D.   On: 02/20/2021 20:23   CT PELVIS WO CONTRAST  Result Date: 02/20/2021 CLINICAL DATA:  Fall, left hip pain/fracture EXAM: CT PELVIS WITHOUT CONTRAST TECHNIQUE: Multidetector CT imaging of the pelvis was performed following the standard protocol without intravenous contrast. RADIATION DOSE REDUCTION: This exam was performed according to the departmental dose-optimization program which includes automated exposure control, adjustment of the mA and/or kV according to patient size and/or use of iterative reconstruction technique. COMPARISON:  Left hip radiographs dated 02/20/2021 FINDINGS: Urinary Tract:  Bladder is within normal limits. Bowel:  Visualized bowel is unremarkable. Vascular/Lymphatic: No evidence of aneurysm. Atherosclerotic calcifications. No suspicious pelvic lymphadenopathy. Reproductive:  Status post prostatectomy. Other:  No pelvic ascites. Musculoskeletal: Intertrochanteric left hip fracture, mildly comminuted, but without significant displacement. No associated large fluid collection/hematoma. No pelvic fracture is seen. Mild degenerative changes of the lower lumbar spine with fusion at L4-5. IMPRESSION: Intertrochanteric left hip fracture. No pelvic fracture is seen. Electronically Signed   By: Julian Hy M.D.   On: 02/20/2021 20:21   DG Chest Portable 1 View  Result  Date: 02/20/2021 CLINICAL DATA:  Fall EXAM: PORTABLE CHEST 1 VIEW COMPARISON:  None. FINDINGS: Heart size is normal. Tortuosity of the aorta is noted. The lungs are clear. The vascularity is normal. No effusion. Skin fold present on the right. No acute bone finding. IMPRESSION: No active disease. Electronically Signed   By: Nelson Chimes M.D.   On: 02/20/2021 19:15   DG C-Arm 1-60 Min-No Report  Result Date: 02/21/2021 Fluoroscopy was utilized by the requesting physician.  No radiographic interpretation.   DG HIP UNILAT WITH PELVIS 2-3 VIEWS LEFT  Result Date: 02/21/2021 CLINICAL DATA:  Left hip femoral nail EXAM: DG HIP (WITH OR WITHOUT PELVIS) 2-3V LEFT COMPARISON:  CT pelvis dated 02/20/2021 Radiation Exposure Index (as provided by the fluoroscopic device): 5.2 mGy Kerma FINDINGS: Intraoperative fluoroscopic radiographs during IM nail with dynamic hip screw fixation of an intertrochanteric left hip fracture. IMPRESSION: Status post ORIF of the left hip, as above. Electronically Signed   By: Julian Hy M.D.   On: 02/21/2021 19:28   DG Hip Unilat W or Wo Pelvis 2-3 Views Left  Result Date: 02/20/2021 CLINICAL DATA:  Fall. EXAM: DG HIP (WITH OR WITHOUT PELVIS) 2-3V LEFT COMPARISON:  None. FINDINGS: Mildly displaced and comminuted appearing intertrochanteric fracture of the left femoral neck. The bones are osteopenic.  No dislocation. The soft tissues are unremarkable. Multiple surgical clips noted over the pelvis. IMPRESSION: Mildly displaced and comminuted intertrochanteric fracture of the left femoral neck. Electronically Signed   By: Anner Crete M.D.   On: 02/20/2021 19:14    Microbiology: Recent Results (from the past 240 hour(s))  Resp Panel by RT-PCR (Flu A&B, Covid) Nasopharyngeal Swab     Status: None   Collection Time: 02/20/21  7:20 PM   Specimen: Nasopharyngeal Swab; Nasopharyngeal(NP) swabs in vial transport medium  Result Value Ref Range Status   SARS Coronavirus 2 by RT  PCR NEGATIVE NEGATIVE Final    Comment: (NOTE) SARS-CoV-2 target nucleic acids are NOT DETECTED.  The SARS-CoV-2 RNA is generally detectable in upper respiratory specimens during the acute phase of infection. The lowest concentration of SARS-CoV-2 viral copies this assay can detect is 138 copies/mL. A negative result does not preclude SARS-Cov-2 infection and should not be used as the sole basis for treatment or other patient management decisions. A negative result may occur with  improper specimen collection/handling, submission of specimen other than nasopharyngeal swab, presence of viral mutation(s) within the areas targeted by this assay, and inadequate number of viral copies(<138 copies/mL). A negative result must be combined with clinical observations, patient history, and epidemiological information. The expected result is Negative.  Fact Sheet for Patients:  EntrepreneurPulse.com.au  Fact Sheet for Healthcare Providers:  IncredibleEmployment.be  This test is no t yet approved or cleared by the [place] FDA and  has been authorized for detection and/or diagnosis of SARS-CoV-2 by FDA under an Emergency Use Authorization (EUA). This EUA will remain  in effect (meaning this test can be used) for the duration of the COVID-19 declaration under Section 564(b)(1) of the Act, 21 U.S.C.section 360bbb-3(b)(1), unless the authorization is terminated  or revoked sooner.       Influenza A by PCR NEGATIVE NEGATIVE Final   Influenza B by PCR NEGATIVE NEGATIVE Final    Comment: (NOTE) The Xpert Xpress SARS-CoV-2/FLU/RSV plus assay is intended as an aid in the diagnosis of influenza from Nasopharyngeal swab specimens and should not be used as a sole basis for treatment. Nasal washings and aspirates are unacceptable for Xpert Xpress SARS-CoV-2/FLU/RSV testing.  Fact Sheet for Patients: EntrepreneurPulse.com.au  Fact Sheet for  Healthcare Providers: IncredibleEmployment.be  This test is not yet approved or cleared by the [place] FDA and has been authorized for detection and/or diagnosis of SARS-CoV-2 by FDA under an Emergency Use Authorization (EUA). This EUA will remain in effect (meaning this test can be used) for the duration of the COVID-19 declaration under Section 564(b)(1) of the Act, 21 U.S.C. section 360bbb-3(b)(1), unless the authorization is terminated or revoked.  Performed at [facility], [address] [place], [place] [address]   Surgical PCR screen     Status: None   Collection Time: 02/20/21 11:42 PM   Specimen: Nasal Mucosa; Nasal Swab  Result Value Ref Range Status   MRSA, PCR NEGATIVE NEGATIVE Final   Staphylococcus aureus NEGATIVE NEGATIVE Final    Comment: (NOTE) The Xpert SA Assay (FDA approved for NASAL specimens in patients 2 years of age and older), is one component of a comprehensive surveillance program. It is not intended to diagnose infection nor to guide or monitor treatment. Performed at [facility], [address] [place], [place] [address]      Labs: Basic Metabolic Panel: Recent Labs  Lab 02/23/21 [phone] 02/24/21 8768 02/25/21 0559 02/26/21 1157 02/27/21 2620  NA 134* 137 134* 136 137  K 4.2 4.0 4.4 4.5 4.4  CL 102 106 105 108 108  CO2 22 22 22 22 23   GLUCOSE 112* 105* 108* 106* 109*  BUN 53* 57* 60* 63* 58*  CREATININE 1.70* 1.50* 1.73* 1.60* 1.51*  CALCIUM 8.2* 8.4* 8.0* 8.2* 8.3*  MG  --   --   --  2.0  --    Liver Function Tests: Recent Labs  Lab 02/20/21 1920  AST 28  ALT 30  ALKPHOS 97  BILITOT 0.7  PROT 8.2*  ALBUMIN 4.3   No results for input(s): LIPASE, AMYLASE in the last 168 hours. No results for input(s): AMMONIA in the last 168 hours. CBC: Recent Labs  Lab 02/20/21 1920 02/21/21 0514 02/23/21 0605 02/24/21 0625 02/25/21 0559 02/25/21 1427 02/26/21 0527 02/26/21 1352  02/27/21 0629  WBC 8.7   < > 9.6 8.6 8.1  --  7.7  --  8.1  NEUTROABS 7.2  --   --   --   --   --  5.5  --  6.2  HGB 12.2*   < > 7.9* 8.8* 7.5* 8.5* 7.4* 8.9* 8.7*  HCT 37.2*   < > 24.5* 25.6* 23.3* 26.2* 22.7* 26.9* 26.7*  MCV 92.8   < > 92.5 93.8 92.5  --  92.7  --  90.2  PLT 286   < > 214 268 277  --  299  --  319   < > = values in this interval not displayed.   Cardiac Enzymes: No results for input(s): CKTOTAL, CKMB, CKMBINDEX, TROPONINI in the last 168 hours. BNP: BNP (last 3 results) No results for input(s): BNP in the last 8760 hours.  ProBNP (last 3 results) No results for input(s): PROBNP in the last 8760 hours.  CBG: No results for input(s): GLUCAP in the last 168 hours.     Signed:  Irine Seal MD.  [facility] 02/27/2021, 12:50 PM

## 2021-03-04 ENCOUNTER — Emergency Department (HOSPITAL_COMMUNITY): Payer: Medicare Other

## 2021-03-04 ENCOUNTER — Encounter (HOSPITAL_COMMUNITY): Payer: Self-pay | Admitting: Emergency Medicine

## 2021-03-04 ENCOUNTER — Emergency Department (HOSPITAL_COMMUNITY)
Admission: EM | Admit: 2021-03-04 | Discharge: 2021-03-05 | Disposition: A | Discharge status: Skilled Nursing Facility | Payer: Medicare Other | Attending: Emergency Medicine | Admitting: Emergency Medicine

## 2021-03-04 DIAGNOSIS — M1990 Unspecified osteoarthritis, unspecified site: Secondary | ICD-10-CM | POA: Insufficient documentation

## 2021-03-04 DIAGNOSIS — Z8546 Personal history of malignant neoplasm of prostate: Secondary | ICD-10-CM | POA: Diagnosis not present

## 2021-03-04 DIAGNOSIS — R7989 Other specified abnormal findings of blood chemistry: Secondary | ICD-10-CM | POA: Insufficient documentation

## 2021-03-04 DIAGNOSIS — D72829 Elevated white blood cell count, unspecified: Secondary | ICD-10-CM | POA: Insufficient documentation

## 2021-03-04 DIAGNOSIS — R9431 Abnormal electrocardiogram [ECG] [EKG]: Secondary | ICD-10-CM | POA: Insufficient documentation

## 2021-03-04 DIAGNOSIS — D649 Anemia, unspecified: Secondary | ICD-10-CM | POA: Insufficient documentation

## 2021-03-04 LAB — CBC
HCT: 31.9 % — ABNORMAL LOW (ref 39.0–52.0)
Hemoglobin: 10.3 g/dL — ABNORMAL LOW (ref 13.0–17.0)
MCH: 29.9 pg (ref 26.0–34.0)
MCHC: 32.3 g/dL (ref 30.0–36.0)
MCV: 92.5 fL (ref 80.0–100.0)
Platelets: 549 10*3/uL — ABNORMAL HIGH (ref 150–400)
RBC: 3.45 MIL/uL — ABNORMAL LOW (ref 4.22–5.81)
RDW: 13.7 % (ref 11.5–15.5)
WBC: 16.6 10*3/uL — ABNORMAL HIGH (ref 4.0–10.5)
nRBC: 0 % (ref 0.0–0.2)

## 2021-03-04 LAB — BASIC METABOLIC PANEL
Anion gap: 13 (ref 5–15)
BUN: 58 mg/dL — ABNORMAL HIGH (ref 8–23)
CO2: 19 mmol/L — ABNORMAL LOW (ref 22–32)
Calcium: 8.7 mg/dL — ABNORMAL LOW (ref 8.9–10.3)
Chloride: 105 mmol/L (ref 98–111)
Creatinine, Ser: 1.5 mg/dL — ABNORMAL HIGH (ref 0.61–1.24)
GFR, Estimated: 45 mL/min — ABNORMAL LOW (ref >60)
Glucose, Bld: 152 mg/dL — ABNORMAL HIGH (ref 70–99)
Potassium: 3.8 mmol/L (ref 3.5–5.1)
Sodium: 137 mmol/L (ref 135–145)

## 2021-03-04 LAB — LACTIC ACID, PLASMA: Lactic Acid, Venous: 1.3 mmol/L (ref 0.5–1.9)

## 2021-03-04 LAB — TROPONIN I (HIGH SENSITIVITY)
Troponin I (High Sensitivity): 15 ng/L (ref <18)
Troponin I (High Sensitivity): 11 ng/L (ref <18)

## 2021-03-04 MED ORDER — CEFDINIR 300 MG PO CAPS: 300.0000 mg | ORAL_CAPSULE | Freq: Two times a day (BID) | ORAL | 0 refills | Status: AC

## 2021-03-04 MED ORDER — PREDNISONE 50 MG PO TABS: 50.0000 mg | ORAL_TABLET | Freq: Every day | ORAL | 0 refills | Status: AC

## 2021-03-04 MED ORDER — DOXYCYCLINE HYCLATE 100 MG PO CAPS: 100.0000 mg | ORAL_CAPSULE | Freq: Two times a day (BID) | ORAL | 0 refills | Status: AC

## 2021-03-04 MED ORDER — PREDNISONE 20 MG PO TABS
50.0000 mg | ORAL_TABLET | Freq: Once | ORAL | Status: AC
  Administered 2021-03-04: 50 mg via ORAL
  Filled 2021-03-04: qty 3

## 2021-03-04 MED ORDER — PREDNISONE 50 MG PO TABS
50.0000 mg | ORAL_TABLET | Freq: Every day | ORAL | 0 refills | Status: DC
Start: 2021-03-04 — End: 2021-03-04

## 2021-03-04 NOTE — ED Provider Notes (Signed)
[facility] EMERGENCY DEPARTMENT Provider Note   CSN: [id number] Arrival date & time: 03/04/21  1111     History  Chief Complaint  Patient presents with   Abnormal ECG    Jamie Brennan is a 88 y.o. male presenting today from a SNF facility due to an abnormal routine EKG.  Patient was placed in SNF due to recent left closed hip fracture that underwent surgery on 02/21/21 with minor postop complication of anemia.  Patient without complaints.  Denies chest pain, palpitations, shortness of breath, abdominal pain, N/V, constipation or diarrhea.  Denies any focal deficits or facial asymmetry.  Denies recent illness or known sick contacts.  Denies fever, lightheadedness, dizziness.  Denies personal history of gout.  History of prostate cancer and chronic kidney disease stage III.  Difficulty with ambulation at baseline.  The history is provided by the patient and medical records.      Home Medications Prior to Admission medications   Medication Sig Start Date End Date Taking? Authorizing Provider  cefdinir (OMNICEF) 300 MG capsule Take 1 capsule (300 mg total) by mouth every 12 (twelve) hours for 7 days. 03/05/21 03/12/21 Yes Prince Rome, PA-C  doxycycline (VIBRAMYCIN) 100 MG capsule Take 1 capsule (100 mg total) by mouth every 12 (twelve) hours for 7 days. 03/05/21 03/12/21 Yes Prince Rome, PA-C  acetaminophen (TYLENOL) 500 MG tablet Take 1 tablet (500 mg total) by mouth every 6 (six) hours as needed for mild pain, moderate pain, fever or headache. 02/27/21   Eugenie Filler, MD  docusate sodium (COLACE) 100 MG capsule Take 2 capsules (200 mg total) by mouth 2 (two) times daily. 02/27/21   Eugenie Filler, MD  HYDROcodone-acetaminophen (NORCO/VICODIN) 5-325 MG tablet Take 1-2 tablets by mouth every 6 (six) hours as needed for moderate pain. 02/27/21   Eugenie Filler, MD  polyethylene glycol (MIRALAX / GLYCOLAX) 17 g packet Take 17 g by mouth daily as  needed for mild constipation or moderate constipation. 02/27/21   Eugenie Filler, MD  predniSONE (DELTASONE) 50 MG tablet Take 1 tablet (50 mg total) by mouth daily for 4 days. 03/04/21 1/70/01  Prince Rome, PA-C  traZODone (DESYREL) 50 MG tablet Take 0.5 tablets (25 mg total) by mouth at bedtime as needed for sleep. 02/27/21   Eugenie Filler, MD  vitamin B-12 (CYANOCOBALAMIN) 1000 MCG tablet Take 1 tablet (1,000 mcg total) by mouth daily. 02/27/21   Eugenie Filler, MD      Allergies    Patient has no known allergies.    Review of Systems   Review of Systems  Respiratory: Negative.    Cardiovascular: Negative.   All other systems reviewed and are negative.  Physical Exam Updated Vital Signs BP (!) 134/111    Pulse 73    Temp 97.9 F (36.6 C) (Oral)    Resp 17    Ht 5\' 9"  (1.753 m)    Wt 49.9 kg    SpO2 98%    BMI 16.24 kg/m  Physical Exam Vitals and nursing note reviewed.  Constitutional:      General: He is not in acute distress.    Appearance: He is well-developed. He is not ill-appearing or diaphoretic.  HENT:     Head: Normocephalic and atraumatic.     Mouth/Throat:     Pharynx: Oropharynx is clear.  Eyes:     General: No scleral icterus.    Conjunctiva/sclera: Conjunctivae normal.  Cardiovascular:  Rate and Rhythm: Normal rate and regular rhythm.     Heart sounds: No murmur heard. Pulmonary:     Effort: Pulmonary effort is normal. No respiratory distress.     Breath sounds: Normal breath sounds.  Abdominal:     General: Bowel sounds are normal.     Palpations: Abdomen is soft.     Tenderness: There is no abdominal tenderness.  Musculoskeletal:        General: No swelling.     Right hand: Normal.     Left hand: Swelling, tenderness (with warmth) and bony tenderness present.       Hands:     Cervical back: Neck supple.     Right hip: Normal.     Right knee: Tenderness (with warmth) present.     Comments:  Decreased ROM of left index and little  fingers Posterior PIP joints of left index and little fingers tender, erythematous, and edematous  Left hip with bandage and wound vac over previously noted surgical site without obvious signs of infection.  Wound vac not on  Skin:    General: Skin is warm and dry.     Capillary Refill: Capillary refill takes less than 2 seconds.  Neurological:     General: No focal deficit present.     Mental Status: He is alert and oriented to person, place, and time.  Psychiatric:        Mood and Affect: Mood normal.    ED Results / Procedures / Treatments   Labs (all labs ordered are listed, but only abnormal results are displayed) Labs Reviewed  BASIC METABOLIC PANEL - Abnormal; Notable for the following components:      Result Value   CO2 19 (*)    Glucose, Bld 152 (*)    BUN 58 (*)    Creatinine, Ser 1.50 (*)    Calcium 8.7 (*)    GFR, Estimated 45 (*)    All other components within normal limits  CBC - Abnormal; Notable for the following components:   WBC 16.6 (*)    RBC 3.45 (*)    Hemoglobin 10.3 (*)    HCT 31.9 (*)    Platelets 549 (*)    All other components within normal limits  CULTURE, BLOOD (ROUTINE X 2)  CULTURE, BLOOD (ROUTINE X 2)  LACTIC ACID, PLASMA  LACTIC ACID, PLASMA  TROPONIN I (HIGH SENSITIVITY)  TROPONIN I (HIGH SENSITIVITY)    EKG None  Radiology DG Chest Port 1 View  Result Date: 03/04/2021 CLINICAL DATA:  Abnormal EKG EXAM: PORTABLE CHEST 1 VIEW COMPARISON:  02/20/2021 FINDINGS: The heart size and mediastinal contours are within normal limits. Both lungs are clear. The visualized skeletal structures are unremarkable. IMPRESSION: No active disease. Electronically Signed   By: Franchot Gallo M.D.   On: 03/04/2021 14:44   DG Hand Complete Left  Result Date: 03/04/2021 CLINICAL DATA:  Left hand joint swelling, warmth and redness. EXAM: LEFT HAND - COMPLETE 3+ VIEW COMPARISON:  None. FINDINGS: Degenerative changes in the proximal and distal interphalangeal  joints. Focal soft tissue swelling overlies the second and fifth proximal interphalangeal joints. No acute osseous abnormality. IMPRESSION: 1. Focal soft tissue swelling over the second and fifth proximal interphalangeal joints. No underlying osseous erosion to suggest osteomyelitis. 2. Proximal and distal interphalangeal joint osteoarthritis. Electronically Signed   By: Lorin Picket M.D.   On: 03/04/2021 15:32    Procedures Procedures    Medications Ordered in ED Medications  predniSONE (DELTASONE) tablet  50 mg (has no administration in time range)    ED Course/ Medical Decision Making/ A&P                           Medical Decision Making Amount and/or Complexity of Data Reviewed Labs: ordered. Decision-making details documented in ED Course. Radiology: ordered and independent interpretation performed. Decision-making details documented in ED Course. ECG/medicine tests: ordered and independent interpretation performed. Decision-making details documented in ED Course.  Risk OTC drugs. Prescription drug management.   88 year old male presents to the ED for concern of abnormal EKG.  This involves an extensive number of treatment options, and is a complaint that carries with it a high risk of complications and morbidity.  The differential diagnosis includes acute coronary syndrome, arrhythmia, prior cardiac event.  Comorbidities that complicate the patient evaluation include chronic kidney disease  Additional history obtained from internal/external records available via epic  Interpretation: I ordered, and personally interpreted labs.  The pertinent results include: Elevated WBC of 16.6, which may suggest infection or stress.  Troponins negative.  Lactic acid of 1.3, less suspicious of infection.  Creatinine elevated, but improved since previous labs over the last few weeks, and correlates with history of chronic kidney disease stage III.  Mild anemia noted, but has improved since  recent surgery.  I ordered imaging studies including chest x-ray, x-ray of the left hand, and EKG.  I independently visualized and interpreted imaging which showed no evidence of acute cardiopulmonary condition.   Imaging did indicate focal soft tissue swelling over the second and fifth proximal interphalangeal joints without evidence of osteomyelitis.  I agree with the radiologist interpretation.  EKG showed inverted P waves in leads II and III, inverted T waves of the anterior leads suggesting possible ischemia, but no significant ST segment elevation or depression.  No evidence of arrhythmias.  Intervention: I ordered medication including prednisone for inflammation .  Reevaluation of the patient after these medicines showed that the patient mild improvement.  I have reviewed the patients home medicines and have made adjustments as needed  The patient was maintained on a cardiac monitor.  I personally viewed and interpreted the cardiac monitored which showed an underlying rhythm of: Normal sinus rhythm  ED Course: Patient well-appearing on exam with mild dementia.  Alert and oriented x3, but was unable to identify why he was brought to the ED.  Patient recently underwent surgery for a closed fracture of the left hip.  Mild anemia associated postoperatively which seems to have improved based on today's lab.  Patient currently has a incisional vacuum seal placed.  Negative cardiac markers and without initial complaints.  No symptomology or history or physical exam suggesting acute cardiopulmonary condition.  Incidental finding of 2 swollen joints of the left hand.  X-ray imaging not suggestive of osteomyelitis.  Elevated WBC, which was not elevated 5 days prior, with physical exam could be suggestive of infection or acute gout.  Patient has no known history of gout.  Currently on two antibiotics post-operatively.  Per surgical note, patient has post-operative incisional staples on left thigh.  I  requested consultation with the on call hand specialist, Silvestre Gunner PA-C, and discussed lab and imaging findings as well as pertinent plan.  Recommended MRI as best test to differentiate between acute gout or possible septic arthritis, but that the patient isn't eligible due to post-op staples.  Recommended leaving them in place for optimal wound healing.  In conclusion, recommended  removal of incisional wound vac, extended duration of antibiotic treatment, and antiinflammatory treatment, with continued outpatient follow up to orthopedics as scheduled.      Wound VAC was removed and incision areas were rebandaged.  Incisions and surrounding tissue without discharge, erythema, or other signs of infection.  After the interventions noted above, I reevaluated the patient and found that they have improved.  Stable for discharge.   Social Determinants of Health include Transportation needs, decreased physical ability  Dispostion: I discussed the patient and their case with my attending, Dr. Gilford Raid and Caryl Ada PA-C, who agreed with the proposed treatment course.  After consideration of the diagnostic results and the patient's response to treatment, I feel that the patent would benefit from outpatient follow-up with orthopedics and PCP with an increased duration of antibiotics and short course of steroids.  Discussed course of treatment thoroughly with the patient and he demonstrated understanding.  Patient in agreement and has no further questions.        Final Clinical Impression(s) / ED Diagnoses Final diagnoses:  Abnormal EKG  Joint inflammation    Rx / DC Orders ED Discharge Orders          Ordered    cefdinir (OMNICEF) 300 MG capsule  Every 12 hours        03/04/21 1559    doxycycline (VIBRAMYCIN) 100 MG capsule  Every 12 hours        03/04/21 1559    predniSONE (DELTASONE) 50 MG tablet  Daily,   Status:  Discontinued        03/04/21 1559    predniSONE (DELTASONE) 50 MG  tablet  Daily        03/04/21 1719              Candace Cruise [birth year] 1745    Isla Pence, MD 03/05/21 [phone]

## 2021-03-04 NOTE — ED Triage Notes (Signed)
Patient BIB [facility] from [facility] for evaluation after the automated interpretation report of an EKG done today stated "Abnormal ECG". Patient reports intermittent indigestion after eating for the last month, has no complaints at this time. Denies pain, denies shortness of breath. Patient is alert, oriented, and in no apparent distress at this time.

## 2021-03-04 NOTE — Discharge Instructions (Addendum)
Your antibiotic course of Omnicef and doxycycline has been extended.  A prescription to extend both these antibiotics has been sent to your pharmacy.    Keep your follow-up appointment with orthopedics for your postoperative visit.   Follow-up with your primary care in the next 2 to 3 days.  Return to the ED for new or worsening symptoms as discussed.

## 2021-03-04 NOTE — ED Notes (Signed)
This RN reached out to [facility] R/[facility] transport ETA, Informed aprox 2 hours.

## 2021-03-04 NOTE — ED Provider Triage Note (Signed)
Emergency Medicine Provider Triage Evaluation Note  Jamie Brennan , a 88 y.o. male  was evaluated in triage.  Pt sent here fr abnormal ekg  Review of Systems  Positive: Hip pain Negative: fever  Physical Exam  BP (!) 143/90 (BP Location: Left Arm)    Pulse 81    Temp 97.9 F (36.6 C) (Oral)    Resp 16    SpO2 100%  Gen:   Awake, no distress   Resp:  Normal effort  MSK:   Pain with movement left leg Other:    Medical Decision Making  Medically screening exam initiated at 11:31 AM.  Appropriate orders placed.  Jamie Brennan was informed that the remainder of the evaluation will be completed by another provider, this initial triage assessment does not replace that evaluation, and the importance of remaining in the ED until their evaluation is complete.     Jamie Brennan, [place] 03/04/21 1136

## 2021-03-05 NOTE — ED Notes (Signed)
Report called to Maudie Mercury, LPN at [facility] and informed that wound vac has not been in place during entire ED stay and that wound is clean, dry, and intact. MD here aware of wound vac being off. Instructed Kim to call ortho tomorrow for follow up about replacement of Wound vac if needed, no one here to replace tonight.

## 2021-03-09 LAB — CULTURE, BLOOD (ROUTINE X 2)
Culture: NO GROWTH
Special Requests: ADEQUATE

## 2021-03-28 ENCOUNTER — Emergency Department
Admission: EM | Admit: 2021-03-28 | Discharge: 2021-03-29 | Disposition: A | Discharge status: Home / Self Care | Payer: Medicare Other | Attending: Emergency Medicine | Admitting: Emergency Medicine

## 2021-03-28 ENCOUNTER — Emergency Department: Payer: Medicare Other

## 2021-03-28 DIAGNOSIS — R5381 Other malaise: Secondary | ICD-10-CM

## 2021-03-28 DIAGNOSIS — R630 Anorexia: Secondary | ICD-10-CM | POA: Insufficient documentation

## 2021-03-28 DIAGNOSIS — N189 Chronic kidney disease, unspecified: Secondary | ICD-10-CM | POA: Insufficient documentation

## 2021-03-28 DIAGNOSIS — R531 Weakness: Secondary | ICD-10-CM | POA: Insufficient documentation

## 2021-03-28 LAB — BASIC METABOLIC PANEL
Anion gap: 9 (ref 5–15)
BUN: 28 mg/dL — ABNORMAL HIGH (ref 8–23)
CO2: 24 mmol/L (ref 22–32)
Calcium: 8.3 mg/dL — ABNORMAL LOW (ref 8.9–10.3)
Chloride: 102 mmol/L (ref 98–111)
Creatinine, Ser: 1.71 mg/dL — ABNORMAL HIGH (ref 0.61–1.24)
GFR, Estimated: 38 mL/min — ABNORMAL LOW (ref >60)
Glucose, Bld: 201 mg/dL — ABNORMAL HIGH (ref 70–99)
Potassium: 4.5 mmol/L (ref 3.5–5.1)
Sodium: 135 mmol/L (ref 135–145)

## 2021-03-28 LAB — CBC
HCT: 33.6 % — ABNORMAL LOW (ref 39.0–52.0)
Hemoglobin: 10.4 g/dL — ABNORMAL LOW (ref 13.0–17.0)
MCH: 29.1 pg (ref 26.0–34.0)
MCHC: 31 g/dL (ref 30.0–36.0)
MCV: 94.1 fL (ref 80.0–100.0)
Platelets: 295 10*3/uL (ref 150–400)
RBC: 3.57 MIL/uL — ABNORMAL LOW (ref 4.22–5.81)
RDW: 14.1 % (ref 11.5–15.5)
WBC: 14 10*3/uL — ABNORMAL HIGH (ref 4.0–10.5)
nRBC: 0 % (ref 0.0–0.2)

## 2021-03-28 MED ORDER — SODIUM CHLORIDE 0.9 % IV BOLUS
500.0000 mL | Freq: Once | INTRAVENOUS | Status: AC
  Administered 2021-03-28: 500 mL via INTRAVENOUS

## 2021-03-28 MED ORDER — ACETAMINOPHEN 500 MG PO TABS
1000.0000 mg | ORAL_TABLET | Freq: Once | ORAL | Status: AC
  Administered 2021-03-28: 1000 mg via ORAL
  Filled 2021-03-28: qty 2

## 2021-03-28 NOTE — ED Triage Notes (Signed)
Pt arrives via EMS from home d/t weakness for the last day. Pt was recently at [facility] for rehab and went home. Pt has no other further complaints at this time. ?

## 2021-03-29 LAB — URINALYSIS, ROUTINE W REFLEX MICROSCOPIC
Bacteria, UA: NONE SEEN
Bilirubin Urine: NEGATIVE
Glucose, UA: NEGATIVE mg/dL
Ketones, ur: NEGATIVE mg/dL
Leukocytes,Ua: NEGATIVE
Nitrite: NEGATIVE
Protein, ur: NEGATIVE mg/dL
Specific Gravity, Urine: 1.01 (ref 1.005–1.030)
Squamous Epithelial / HPF: NONE SEEN (ref 0–5)
pH: 5 (ref 5.0–8.0)

## 2021-03-29 NOTE — Discharge Instructions (Addendum)
Use Tylenol for pain and fevers.  Up to 1000 mg per dose, up to 4 times per day.  Do not take more than 4000 mg of Tylenol/acetaminophen within 24 hours.. ? ?If you develop any severely worsening weakness, fevers or concerns for inability to care for yourself at home then please return to the ED. ?

## 2021-03-29 NOTE — ED Notes (Signed)
Pt family member states she will be able to pick patient up around 8am this morning. 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

## 2021-03-29 NOTE — ED Provider Notes (Signed)
Patient received in signout from Dr. Joni Fears pending urinalysis and reassessment for presentation of generalized weakness.  His work-up is benign with CKD at baseline, mild nonspecific leukocytosis.  Urine without infectious features and CXR is clear.  No evidence of bony pathology to his feet.  I reevaluate the patient and he reports feeling well, just reports intermittent generalized weakness over the past few weeks.  Reports he typically cares for himself and all of his ADLs, lives with his niece who is caring for her mother with severe dementia so he has to care for himself as niece is quite busy.  We discussed disposition and I offer to have PT and social work see the patient for rehab placement, but he declines indicating he was to go back home with his niece as his home health PT is coming on Monday.  We discussed the risks of this, he acknowledges this and still wishes to go home.  We will reach out to the niece for a ride home.  We discussed return precautions. ?  ?Vladimir Crofts, MD ?03/29/21 0200 ? ?

## 2021-04-10 ENCOUNTER — Emergency Department: Payer: Medicare Other

## 2021-04-10 ENCOUNTER — Inpatient Hospital Stay
Admission: EM | Admit: 2021-04-10 | Discharge: 2021-04-17 | DRG: 280 | Disposition: A | Discharge status: Skilled Nursing Facility | Payer: Medicare Other | Attending: Internal Medicine | Admitting: Internal Medicine

## 2021-04-10 ENCOUNTER — Inpatient Hospital Stay: Payer: Medicare Other

## 2021-04-10 ENCOUNTER — Encounter
Admission: EM | Disposition: A | Discharge status: Skilled Nursing Facility | Payer: Self-pay | Source: Home / Self Care | Attending: Hospitalist

## 2021-04-10 ENCOUNTER — Emergency Department
Admit: 2021-04-10 | Discharge: 2021-04-10 | Disposition: A | Discharge status: Home / Self Care | Payer: Medicare Other | Attending: Cardiology | Admitting: Cardiology

## 2021-04-10 DIAGNOSIS — N1832 Chronic kidney disease, stage 3b: Secondary | ICD-10-CM | POA: Diagnosis present

## 2021-04-10 DIAGNOSIS — I5021 Acute systolic (congestive) heart failure: Secondary | ICD-10-CM | POA: Diagnosis present

## 2021-04-10 DIAGNOSIS — E871 Hypo-osmolality and hyponatremia: Secondary | ICD-10-CM | POA: Diagnosis present

## 2021-04-10 DIAGNOSIS — N179 Acute kidney failure, unspecified: Secondary | ICD-10-CM | POA: Diagnosis present

## 2021-04-10 DIAGNOSIS — R410 Disorientation, unspecified: Secondary | ICD-10-CM

## 2021-04-10 DIAGNOSIS — Z8546 Personal history of malignant neoplasm of prostate: Secondary | ICD-10-CM

## 2021-04-10 DIAGNOSIS — Z9079 Acquired absence of other genital organ(s): Secondary | ICD-10-CM

## 2021-04-10 DIAGNOSIS — Z993 Dependence on wheelchair: Secondary | ICD-10-CM

## 2021-04-10 DIAGNOSIS — E860 Dehydration: Secondary | ICD-10-CM

## 2021-04-10 DIAGNOSIS — L89610 Pressure ulcer of right heel, unstageable: Secondary | ICD-10-CM | POA: Diagnosis present

## 2021-04-10 DIAGNOSIS — I96 Gangrene, not elsewhere classified: Secondary | ICD-10-CM | POA: Diagnosis present

## 2021-04-10 DIAGNOSIS — I2109 ST elevation (STEMI) myocardial infarction involving other coronary artery of anterior wall: Principal | ICD-10-CM | POA: Diagnosis present

## 2021-04-10 DIAGNOSIS — I255 Ischemic cardiomyopathy: Secondary | ICD-10-CM | POA: Diagnosis present

## 2021-04-10 DIAGNOSIS — I213 ST elevation (STEMI) myocardial infarction of unspecified site: Secondary | ICD-10-CM | POA: Diagnosis present

## 2021-04-10 DIAGNOSIS — L89620 Pressure ulcer of left heel, unstageable: Secondary | ICD-10-CM | POA: Diagnosis present

## 2021-04-10 DIAGNOSIS — D72829 Elevated white blood cell count, unspecified: Secondary | ICD-10-CM | POA: Diagnosis present

## 2021-04-10 DIAGNOSIS — Z79899 Other long term (current) drug therapy: Secondary | ICD-10-CM | POA: Diagnosis not present

## 2021-04-10 DIAGNOSIS — Z66 Do not resuscitate: Secondary | ICD-10-CM | POA: Diagnosis present

## 2021-04-10 DIAGNOSIS — Z96642 Presence of left artificial hip joint: Secondary | ICD-10-CM | POA: Diagnosis present

## 2021-04-10 DIAGNOSIS — R4182 Altered mental status, unspecified: Secondary | ICD-10-CM | POA: Diagnosis present

## 2021-04-10 DIAGNOSIS — Z79891 Long term (current) use of opiate analgesic: Secondary | ICD-10-CM | POA: Diagnosis not present

## 2021-04-10 DIAGNOSIS — S72002A Fracture of unspecified part of neck of left femur, initial encounter for closed fracture: Secondary | ICD-10-CM | POA: Diagnosis present

## 2021-04-10 DIAGNOSIS — S72002P Fracture of unspecified part of neck of left femur, subsequent encounter for closed fracture with malunion: Secondary | ICD-10-CM | POA: Diagnosis not present

## 2021-04-10 LAB — CBC WITH DIFFERENTIAL/PLATELET
Abs Immature Granulocytes: 0.13 10*3/uL — ABNORMAL HIGH (ref 0.00–0.07)
Basophils Absolute: 0 10*3/uL (ref 0.0–0.1)
Basophils Relative: 0 %
Eosinophils Absolute: 0 10*3/uL (ref 0.0–0.5)
Eosinophils Relative: 0 %
HCT: 35.1 % — ABNORMAL LOW (ref 39.0–52.0)
Hemoglobin: 11.4 g/dL — ABNORMAL LOW (ref 13.0–17.0)
Immature Granulocytes: 1 %
Lymphocytes Relative: 4 %
Lymphs Abs: 0.7 10*3/uL (ref 0.7–4.0)
MCH: 29.4 pg (ref 26.0–34.0)
MCHC: 32.5 g/dL (ref 30.0–36.0)
MCV: 90.5 fL (ref 80.0–100.0)
Monocytes Absolute: 2.1 10*3/uL — ABNORMAL HIGH (ref 0.1–1.0)
Monocytes Relative: 11 %
Neutro Abs: 15.5 10*3/uL — ABNORMAL HIGH (ref 1.7–7.7)
Neutrophils Relative %: 84 %
Platelets: 453 10*3/uL — ABNORMAL HIGH (ref 150–400)
RBC: 3.88 MIL/uL — ABNORMAL LOW (ref 4.22–5.81)
RDW: 14.5 % (ref 11.5–15.5)
WBC: 18.4 10*3/uL — ABNORMAL HIGH (ref 4.0–10.5)
nRBC: 0 % (ref 0.0–0.2)

## 2021-04-10 LAB — URINALYSIS, COMPLETE (UACMP) WITH MICROSCOPIC
Bacteria, UA: NONE SEEN
Bilirubin Urine: NEGATIVE
Glucose, UA: NEGATIVE mg/dL
Ketones, ur: NEGATIVE mg/dL
Nitrite: NEGATIVE
Protein, ur: 30 mg/dL — AB
Specific Gravity, Urine: 1.017 (ref 1.005–1.030)
Squamous Epithelial / HPF: NONE SEEN (ref 0–5)
pH: 5 (ref 5.0–8.0)

## 2021-04-10 LAB — BASIC METABOLIC PANEL
Anion gap: 12 (ref 5–15)
BUN: 36 mg/dL — ABNORMAL HIGH (ref 8–23)
CO2: 23 mmol/L (ref 22–32)
Calcium: 8.5 mg/dL — ABNORMAL LOW (ref 8.9–10.3)
Chloride: 94 mmol/L — ABNORMAL LOW (ref 98–111)
Creatinine, Ser: 1.76 mg/dL — ABNORMAL HIGH (ref 0.61–1.24)
GFR, Estimated: 37 mL/min — ABNORMAL LOW (ref >60)
Glucose, Bld: 150 mg/dL — ABNORMAL HIGH (ref 70–99)
Potassium: 4.7 mmol/L (ref 3.5–5.1)
Sodium: 129 mmol/L — ABNORMAL LOW (ref 135–145)

## 2021-04-10 LAB — ECHOCARDIOGRAM COMPLETE
Area-P 1/2: 4.26 cm2
Calc EF: 28.6 %
Height: 69 in
S' Lateral: 4.4 cm
Single Plane A2C EF: 20 %
Single Plane A4C EF: 37.7 %
Weight: 1834.23 oz

## 2021-04-10 LAB — PROTIME-INR
INR: 1.1 (ref 0.8–1.2)
Prothrombin Time: 14.3 seconds (ref 11.4–15.2)

## 2021-04-10 LAB — CK: Total CK: 355 U/L (ref 49–397)

## 2021-04-10 LAB — D-DIMER, QUANTITATIVE: D-Dimer, Quant: 2.09 ug/mL-FEU — ABNORMAL HIGH (ref 0.00–0.50)

## 2021-04-10 LAB — APTT: aPTT: 34 seconds (ref 24–36)

## 2021-04-10 LAB — LACTIC ACID, PLASMA: Lactic Acid, Venous: 3.1 mmol/L (ref 0.5–1.9)

## 2021-04-10 LAB — HEPARIN LEVEL (UNFRACTIONATED): Heparin Unfractionated: <0.1 IU/mL — ABNORMAL LOW (ref 0.30–0.70)

## 2021-04-10 LAB — TROPONIN I (HIGH SENSITIVITY): Troponin I (High Sensitivity): >24000 ng/L (ref <18)

## 2021-04-10 LAB — BRAIN NATRIURETIC PEPTIDE: B Natriuretic Peptide: 1548.3 pg/mL — ABNORMAL HIGH (ref 0.0–100.0)

## 2021-04-10 SURGERY — CORONARY/GRAFT ACUTE MI REVASCULARIZATION
Anesthesia: Moderate Sedation

## 2021-04-10 MED ORDER — ASPIRIN 81 MG PO CHEW
324.0000 mg | CHEWABLE_TABLET | Freq: Once | ORAL | Status: AC
  Administered 2021-04-10: 324 mg via ORAL

## 2021-04-10 MED ORDER — SODIUM CHLORIDE 0.9 % IV SOLN: INTRAVENOUS | Status: AC

## 2021-04-10 MED ORDER — METOPROLOL TARTRATE 25 MG PO TABS
12.5000 mg | ORAL_TABLET | Freq: Two times a day (BID) | ORAL | Status: DC
  Administered 2021-04-10 – 2021-04-11 (×2): 12.5 mg via ORAL
  Filled 2021-04-10 (×3): qty 1

## 2021-04-10 MED ORDER — ACETAMINOPHEN 500 MG PO TABS: 500.0000 mg | ORAL_TABLET | Freq: Four times a day (QID) | ORAL | Status: DC | PRN

## 2021-04-10 MED ORDER — HEPARIN (PORCINE) 25000 UT/250ML-% IV SOLN
1350.0000 [IU]/h | INTRAVENOUS | Status: DC
  Administered 2021-04-10: 650 [IU]/h via INTRAVENOUS
  Administered 2021-04-11: 1050 [IU]/h via INTRAVENOUS
  Filled 2021-04-10 (×3): qty 250

## 2021-04-10 MED ORDER — CLOPIDOGREL BISULFATE 75 MG PO TABS
75.0000 mg | ORAL_TABLET | Freq: Every day | ORAL | Status: DC
  Administered 2021-04-10 – 2021-04-17 (×8): 75 mg via ORAL
  Filled 2021-04-10 (×8): qty 1

## 2021-04-10 MED ORDER — NITROGLYCERIN 0.4 MG SL SUBL: 0.4000 mg | SUBLINGUAL_TABLET | SUBLINGUAL | Status: DC | PRN

## 2021-04-10 MED ORDER — HYDROCODONE-ACETAMINOPHEN 5-325 MG PO TABS
1.0000 | ORAL_TABLET | Freq: Four times a day (QID) | ORAL | Status: DC | PRN
  Administered 2021-04-10: 2 via ORAL
  Filled 2021-04-10: qty 2

## 2021-04-10 MED ORDER — ATORVASTATIN CALCIUM 20 MG PO TABS
40.0000 mg | ORAL_TABLET | Freq: Every day | ORAL | Status: DC
  Administered 2021-04-10 – 2021-04-17 (×8): 40 mg via ORAL
  Filled 2021-04-10 (×8): qty 2

## 2021-04-10 MED ORDER — SODIUM CHLORIDE 0.9% FLUSH
3.0000 mL | Freq: Two times a day (BID) | INTRAVENOUS | Status: DC
  Administered 2021-04-10 – 2021-04-16 (×12): 3 mL via INTRAVENOUS

## 2021-04-10 MED ORDER — VITAMIN B-12 1000 MCG PO TABS
1000.0000 ug | ORAL_TABLET | Freq: Every day | ORAL | Status: DC
  Administered 2021-04-11 – 2021-04-17 (×7): 1000 ug via ORAL
  Filled 2021-04-10 (×8): qty 1

## 2021-04-10 MED ORDER — POLYETHYLENE GLYCOL 3350 17 G PO PACK: 17.0000 g | PACK | Freq: Every day | ORAL | Status: DC | PRN

## 2021-04-10 MED ORDER — DOCUSATE SODIUM 100 MG PO CAPS
200.0000 mg | ORAL_CAPSULE | Freq: Two times a day (BID) | ORAL | Status: DC
Start: 2021-04-10 — End: 2021-04-17
  Administered 2021-04-10 – 2021-04-17 (×8): 200 mg via ORAL
  Filled 2021-04-10 (×10): qty 2

## 2021-04-10 MED ORDER — PANTOPRAZOLE SODIUM 40 MG PO TBEC
40.0000 mg | DELAYED_RELEASE_TABLET | Freq: Every day | ORAL | Status: DC
  Administered 2021-04-11 – 2021-04-17 (×7): 40 mg via ORAL
  Filled 2021-04-10 (×7): qty 1

## 2021-04-10 MED ORDER — ACETAMINOPHEN 325 MG PO TABS
650.0000 mg | ORAL_TABLET | ORAL | Status: DC | PRN
  Administered 2021-04-12: 650 mg via ORAL
  Filled 2021-04-10: qty 2

## 2021-04-10 MED ORDER — METOPROLOL TARTRATE 25 MG PO TABS: 25.0000 mg | ORAL_TABLET | Freq: Two times a day (BID) | ORAL | Status: DC

## 2021-04-10 MED ORDER — ONDANSETRON HCL 4 MG/2ML IJ SOLN: 4.0000 mg | Freq: Four times a day (QID) | INTRAMUSCULAR | Status: DC | PRN

## 2021-04-10 MED ORDER — SACCHAROMYCES BOULARDII 250 MG PO CAPS
250.0000 mg | ORAL_CAPSULE | Freq: Two times a day (BID) | ORAL | Status: DC
  Administered 2021-04-10 – 2021-04-17 (×13): 250 mg via ORAL
  Filled 2021-04-10 (×17): qty 1

## 2021-04-10 MED ORDER — SODIUM CHLORIDE 0.9% FLUSH: 3.0000 mL | INTRAVENOUS | Status: DC | PRN

## 2021-04-10 MED ORDER — HEPARIN BOLUS VIA INFUSION
1500.0000 [IU] | Freq: Once | INTRAVENOUS | Status: AC
  Administered 2021-04-10: 1500 [IU] via INTRAVENOUS
  Filled 2021-04-10: qty 1500

## 2021-04-10 MED ORDER — SODIUM CHLORIDE 0.9 % IV SOLN: INTRAVENOUS | Status: DC

## 2021-04-10 MED ORDER — HEPARIN BOLUS VIA INFUSION
3100.0000 [IU] | Freq: Once | INTRAVENOUS | Status: AC
  Administered 2021-04-10: 3100 [IU] via INTRAVENOUS
  Filled 2021-04-10: qty 3100

## 2021-04-10 MED ORDER — TRAZODONE HCL 50 MG PO TABS
25.0000 mg | ORAL_TABLET | Freq: Every evening | ORAL | Status: DC | PRN
  Administered 2021-04-13: 25 mg via ORAL
  Filled 2021-04-10: qty 1

## 2021-04-10 MED ORDER — ASPIRIN EC 81 MG PO TBEC
81.0000 mg | DELAYED_RELEASE_TABLET | Freq: Every day | ORAL | Status: DC
  Administered 2021-04-11 – 2021-04-17 (×7): 81 mg via ORAL
  Filled 2021-04-10 (×7): qty 1

## 2021-04-10 MED ORDER — SODIUM CHLORIDE 0.9 % IV BOLUS
500.0000 mL | Freq: Once | INTRAVENOUS | Status: AC
Start: 2021-04-10 — End: 2021-04-10
  Administered 2021-04-10: 500 mL via INTRAVENOUS

## 2021-04-10 MED ORDER — SODIUM CHLORIDE 0.9 % IV SOLN: 250.0000 mL | INTRAVENOUS | Status: DC | PRN

## 2021-04-10 NOTE — Assessment & Plan Note (Addendum)
Most likely related to poor oral intake ?--improved with gentle IV hydration ?

## 2021-04-10 NOTE — Assessment & Plan Note (Addendum)
--  clinically undetermined what type and etiology.  Likely transient.  Mental status back to baseline. ?

## 2021-04-10 NOTE — Consult Note (Signed)
?[facility] CARDIOLOGY CONSULT NOTE  ? ?    ?Patient ID: ?Jamie Brennan ?MRN: [id number] ?DOB/AGE: 09/16/1931 88 y.o. ? ?Admit date: 04/10/2021 ?Referring Physician Dr. Delman Kitten ?Primary Physician Dr. Caryl Comes ?Primary Cardiologist Dr. Clayborn Bigness ?Reason for Consultation abnormal EKG ? ?HPI: The patient is a 88 year old male with a past medical history of CKD 3, history of prostate cancer s/p prostatectomy and radiation 2004, and recent left closed hip fracture s/p IM nailing on 02/20/2021 who presented to [facility] ED 04/10/2021 with weakness and increased confusion and an abnormal EKG concerning for anterior STEMI. Dr. Saralyn Pilar (on call STEMI MD) evaluated the patient (denied chest pain and was otherwise asymptomatic) and reviewed his EKG which revealed Q waves and ST elevation in leads V1 and V2, c/w recent anteroseptal MI of unknown duration.  Code STEMI was canceled and further medical work-up is ongoing. ? ?Today the patient presents with generalized weakness, stating he "just couldn't get up." He says he had a hard time physically getting into bed last night but eventually was able to and this morning he couldn't get out of bed so he figured he would call for help.  Prior to yesterday he was working regularly with physical therapy and able to ambulate independently and perform all of his ADLs reportedly.  He says he used to live in [place] but ever since his fall and hip fracture he has been living with his niece locally (per chart review, seems like since 09/2020). He was referred to Dr. Clayborn Bigness on 03/18/2021 after a reportedly abnormal EKG on 02/20/2021 performed in the emergency department which showed inferior T wave inversions and poor R wave progression.  At that visit an echocardiogram was ordered as well as a Lexiscan Myoview to rule out ischemia or significant coronary disease but these have not been performed yet.  The patient persistently has denied chest pain prior to this hospitalization and  when he was feeling weak this morning.  Denies shortness of breath, heart racing, dizziness, presyncope.  He has left lower extremity edema he says its been worsening for the past week.  His only complaint is that his knees hurt feet hurt when he puts pressure on them.  ? ?Labs are notable for sodium of 129, potassium 4.7, creatinine 1.76 GFR 37 (3/10 was 1.6 and 41 respectively) leukocytosis to 18, hemoglobin 11/hematocrit 35.  Platelets 453.  BNP is elevated to 1548 the initial high-sensitivity troponin elevated to greater than 24,000, suggesting a late presentation. Lactate 3.1. D dimer 2.09.  ? ?Chest x-ray with low lung volumes and bibasilar opacities is likely atelectasis.  ? ?Review of systems complete and found to be negative unless listed above  ? ? ? ?Past Medical History:  ?Diagnosis Date  ? Prostate cancer ([place])   ?  ?Past Surgical History:  ?Procedure Laterality Date  ? BACK SURGERY    ? INTRAMEDULLARY (IM) NAIL INTERTROCHANTERIC Left 02/21/2021  ? Procedure: INTRAMEDULLARY (IM) NAIL INTERTROCHANTRIC;  Surgeon: Renee Harder, MD;  Location: [facility] ORS;  Service: Orthopedics;  Laterality: Left;  ? PROSTATECTOMY    ?  ?(Not in a hospital admission) ? ?Social History  ? ?Socioeconomic History  ? Marital status: Married  ?  Spouse name: Not on file  ? Number of children: Not on file  ? Years of education: Not on file  ? Highest education level: Not on file  ?Occupational History  ? Not on file  ?Tobacco Use  ? Smoking status: Never  ? Smokeless tobacco: Never  ?Vaping  Use  ? Vaping Use: Never used  ?Substance and Sexual Activity  ? Alcohol use: Not Currently  ? Drug use: Never  ? Sexual activity: Not on file  ?Other Topics Concern  ? Not on file  ?Social History Narrative  ? Not on file  ? ?Social Determinants of Health  ? ?Financial Resource Strain: Not on file  ?Food Insecurity: Not on file  ?Transportation Needs: Not on file  ?Physical Activity: Not on file  ?Stress: Not on file  ?Social Connections:  Not on file  ?Intimate Partner Violence: Not on file  ?  ?No family history on file.  ? ? ?Review of systems complete and found to be negative unless listed above  ? ? ?PHYSICAL EXAM ?General: Pleasant elderly and thin Caucasian male, well nourished, in no acute distress. ?HEENT:  Normocephalic and atraumatic. ?Neck:  No JVD.  ?Lungs: Normal respiratory effort on room air. Clear bilaterally to auscultation. No wheezes, crackles, rhonchi.  ?Heart: tachy RRR . Normal S1 and S2 without gallops or murmurs. Radial & DP pulses 2+ bilaterally. ?Abdomen: Non-distended appearing.  ?Msk: Normal strength and tone for age. ?Extremities: Warm and well perfused. No clubbing, cyanosis.  2+ left lower extremity edema.  Trace right lower extremity edema.  No calf tenderness bilaterally, bilateral feet wrapped in Kerlix covered in hospital socks. ?Neuro: Alert and oriented X 3. ?Psych:  Answers questions appropriately.  ? ?Labs: ?  ?Lab Results  ?Component Value Date  ? WBC 18.4 (H) 04/10/2021  ? HGB 11.4 (L) 04/10/2021  ? HCT 35.1 (L) 04/10/2021  ? MCV 90.5 04/10/2021  ? PLT 453 (H) 04/10/2021  ?  ?Recent Labs  ?Lab 04/10/21 ?0720  ?NA 129*  ?K 4.7  ?CL 94*  ?CO2 23  ?BUN 36*  ?CREATININE 1.76*  ?CALCIUM 8.5*  ?GLUCOSE 150*  ? ?Lab Results  ?Component Value Date  ? CKTOTAL 355 04/10/2021  ? No results found for: CHOL ?No results found for: HDL ?No results found for: [place] ?No results found for: TRIG ?No results found for: CHOLHDL ?No results found for: LDLDIRECT  ?  ?Radiology: CT Head Wo Contrast ? ?Result Date: 04/10/2021 ?CLINICAL DATA:  Generalized weakness EXAM: CT HEAD WITHOUT CONTRAST TECHNIQUE: Contiguous axial images were obtained from the base of the skull through the vertex without intravenous contrast. RADIATION DOSE REDUCTION: This exam was performed according to the departmental dose-optimization program which includes automated exposure control, adjustment of the mA and/or kV according to patient size and/or use of  iterative reconstruction technique. COMPARISON:  CT head 02/20/2021 FINDINGS: Brain: There is no evidence of acute intracranial hemorrhage, extra-axial fluid collection, or acute infarct. There is moderate global parenchymal volume loss with prominence of the ventricular system and extra-axial CSF spaces, unchanged. The ventricular system is stable in size and configuration. Gray-white differentiation is preserved. Patchy hypodensity in the subcortical and periventricular white matter likely reflects sequela of chronic white matter microangiopathy. There are small remote lacunar infarcts in the left basal ganglia/insula, unchanged. There is no mass lesion.  There is no mass effect or midline shift. Vascular: No hyperdense vessel or unexpected calcification. Skull: Normal. Negative for fracture or focal lesion. Sinuses/Orbits: Imaged paranasal sinuses are clear. Bilateral lens implants are in place. The globes and orbits are otherwise unremarkable. Other: None. IMPRESSION: No acute intracranial pathology. Electronically Signed   By: Valetta Mole M.D.   On: 04/10/2021 09:20  ? ?DG Chest Portable 1 View ? ?Result Date: 04/10/2021 ?CLINICAL DATA:  Weakness. EXAM: PORTABLE  CHEST 1 VIEW COMPARISON:  03/28/2021 FINDINGS: The cardiomediastinal silhouette is unchanged with normal heart size. Lung volumes are low with mild opacity in both lung bases. No sizable pleural effusion or pneumothorax is identified. No acute osseous abnormality is seen. IMPRESSION: Low lung volumes with bibasilar opacities, likely atelectasis. Electronically Signed   By: Logan Bores M.D.   On: 04/10/2021 07:42  ? ?DG Chest Portable 1 View ? ?Result Date: 03/28/2021 ?CLINICAL DATA:  Weakness EXAM: PORTABLE CHEST 1 VIEW COMPARISON:  03/04/2021 FINDINGS: Mild left basilar opacity, favoring scarring/atelectasis. Right lung is clear. No pleural effusion or pneumothorax. The heart is normal in size. IMPRESSION: No evidence of acute cardiopulmonary disease.  Electronically Signed   By: Julian Hy M.D.   On: 03/28/2021 21:15  ? ?DG Foot 2 Views Left ? ?Result Date: 03/28/2021 ?CLINICAL DATA:  Weakness, heel sores EXAM: LEFT FOOT - 2 VIEW COMPARISON:  None.

## 2021-04-10 NOTE — Consult Note (Signed)
ANTICOAGULATION CONSULT NOTE - Initial Consult ? ?Pharmacy Consult for Heparin ?Indication: chest pain/ACS ? ?No Known Allergies ? ?Patient Measurements: ?Height: '5\' 9"'$  (175.3 cm) ?Weight: 59.5 kg (131 lb 1.6 oz) ?IBW/kg (Calculated) : 70.7 ?Heparin Dosing Weight: 52 kg ? ?Vital Signs: ?Temp: 98.8 ?F (37.1 ?C) (03/31 2028) ?BP: 101/76 (03/31 2028) ?Pulse Rate: 107 (03/31 2028) ? ?Labs: ?Recent Labs  ?  04/10/21 ?0720 04/10/21 ?1923  ?HGB 11.4*  --   ?HCT 35.1*  --   ?PLT 453*  --   ?APTT 34  --   ?LABPROT 14.3  --   ?INR 1.1  --   ?HEPARINUNFRC  --  <0.10*  ?CREATININE 1.76*  --   ?CKTOTAL 355  --   ?TROPONINIHS >24,000*  --   ? ? ? ?Estimated Creatinine Clearance: 24.9 mL/min (A) (by C-G formula based on SCr of 1.76 mg/dL (H)). ? ? ?Medical History: ?Past Medical History:  ?Diagnosis Date  ? Prostate cancer ([place])   ? ? ?Medications:  ?No history of chronic anticoagulation use PTA ? ?Assessment: ?Pharmacy has been consulted to initiate heparin in 89yo patient presenting to the ED with chest pain. EKG show ST elevations in V1 and V2 but also has Q waves consistent with subacute MI. Troponin level>24,000. Cardiology team has been consulted.  ? ?3/31 HL 1923 <0.10, subtherapeutic. Confirmed with nurse infusion was not paused and no issues with line.  ? ?Goal of Therapy:  ?Heparin level 0.3-0.7 units/ml ?Monitor platelets by anticoagulation protocol: Yes ?  ?Plan:  ?Heparin level is subtherapeutic. Will give 1500 bolus and increase heparin infusion to 850 units/hr. Check anti-Xa level in 8 hours and daily while on heparin. Continue to monitor H&H and platelets ? ?Itzelle Gains O Dali Kraner ?04/10/2021,8:46 PM ? ? ?

## 2021-04-10 NOTE — ED Provider Notes (Signed)
? ?[facility] ?Provider Note ? ? ? Event Date/Time  ? First MD Initiated Contact with Patient 04/10/21 [phone]   ?  (approximate) ? ? ?History  ? ?Code STEMI (No cp stated by patient . Nkda, no asa per ems, , 0500 per family more weak, confused ) ? ? ?HPI ? ?Jamie Brennan is a 88 y.o. male history of acute recent left hip surgery, stage III chronic kidney disease upon review of previous note including discharge from  from February 27, 2021 ? ?Patient reports he got up today and just felt very fatigued.  Felt weak in general and lightheaded felt like he just could not get himself up.  Yesterday he also felt fatigued and more weak all over than typical in the evening. ? ?He has had no headache no numbness or weakness in any 1 particular arm or leg except his left lower leg is slightly weaker since the time of his surgery ? ?He denies having any chest pain or shortness of breath and did not have any chest pain or shortness of breath in the last several days ? ?No fevers ?  ? ? ?Physical Exam  ? ?Triage Vital Signs: ?ED Triage Vitals [04/10/21 0718]  ?Enc Vitals Group  ?   BP 102/82  ?   Pulse Rate (!) 110  ?   Resp 18  ?   Temp 98.5 ?F (36.9 ?C)  ?   Temp Source Oral  ?   SpO2 98 %  ?   Weight 114 lb 10.2 oz (52 kg)  ?   Height '5\' 9"'$  (1.753 m)  ?   Head Circumference   ?   Peak Flow   ?   Pain Score 0  ?   Pain Loc   ?   Pain Edu?   ?   Excl. in [place]?   ? ? ?Most recent vital signs: ?Vitals:  ? 04/10/21 0718 04/10/21 0845  ?BP: 102/82 110/78  ?Pulse: (!) 110 (!) 110  ?Resp: 18 17  ?Temp: 98.5 ?F (36.9 ?C) 98.5 ?F (36.9 ?C)  ?SpO2: 98% 98%  ? ? ? ?General: Awake, no distress.  ?CV:  Good peripheral perfusion.  Heart tones are normal.  Slight tachycardia ?Resp:  Normal effort.  Clear lung sounds bilaterally no distress speaks in clear sentences ?Abd:  No distention.  Soft nontender ?Other:  Moves all extremities to command with equal strength.  No new deficits noted.  He does report he has a little  difficulty with good use of the left leg but reports its been since the time of surgery, but does not exhibit weakness ? ?Facial expressions are equal.  Speech is clear.  No facial droop. ? ? ?ED Results / Procedures / Treatments  ? ?Labs ?(all labs ordered are listed, but only abnormal results are displayed) ?Labs Reviewed  ?CBC WITH DIFFERENTIAL/PLATELET - Abnormal; Notable for the following components:  ?    Result Value  ? WBC 18.4 (*)   ? RBC 3.88 (*)   ? Hemoglobin 11.4 (*)   ? HCT 35.1 (*)   ? Platelets 453 (*)   ? Neutro Abs 15.5 (*)   ? Monocytes Absolute 2.1 (*)   ? Abs Immature Granulocytes 0.13 (*)   ? All other components within normal limits  ?BASIC METABOLIC PANEL - Abnormal; Notable for the following components:  ? Sodium 129 (*)   ? Chloride 94 (*)   ? Glucose, Bld 150 (*)   ?  BUN 36 (*)   ? Creatinine, Ser 1.76 (*)   ? Calcium 8.5 (*)   ? GFR, Estimated 37 (*)   ? All other components within normal limits  ?BRAIN NATRIURETIC PEPTIDE - Abnormal; Notable for the following components:  ? B Natriuretic Peptide 1,548.3 (*)   ? All other components within normal limits  ?D-DIMER, QUANTITATIVE - Abnormal; Notable for the following components:  ? D-Dimer, Quant 2.09 (*)   ? All other components within normal limits  ?LACTIC ACID, PLASMA - Abnormal; Notable for the following components:  ? Lactic Acid, Venous 3.1 (*)   ? All other components within normal limits  ?TROPONIN I (HIGH SENSITIVITY) - Abnormal; Notable for the following components:  ? Troponin I (High Sensitivity) >24,000 (*)   ? All other components within normal limits  ?PROTIME-INR  ?APTT  ?CK  ?URINALYSIS, COMPLETE (UACMP) WITH MICROSCOPIC  ?TROPONIN I (HIGH SENSITIVITY)  ? ? ? ?EKG ? ?EKGs are reviewed EKG at 7:20 AM ?Heart rate 110 ?QRS 140 ?QTc 480 ?Sinus tachycardia, intraventricular conduction delay, ST segment elevation notable in V1 and V2 with Q wave. ? ?EKG from 720 also reviewed, heart rate 110 ?QRS 140 QTc 480 ?Sinus tachycardia,  again seen elevation in V1 and V2 with Q waves. ? ?EKGs are consistent with STEMI, however suspect based on the clinical history this may not be acute this may be subacute ? ? ?RADIOLOGY ? ? ? ? ? ?CT of the head personally viewed and interpreted by me, negative for acute gross intracranial pathology.  Radiologist read pending, thereafter reviewed as negative for acute finding ? ? ?PROCEDURES: ? ?Critical Care performed: Yes, see critical care procedure note(s) ? ?Procedures ? ?CRITICAL CARE ?Performed by: Delman Kitten ? ? ?Total critical care time: 35 minutes ? ?Critical care time was exclusive of separately billable procedures and treating other patients. ? ?Critical care was necessary to treat or prevent imminent or life-threatening deterioration. ? ?Critical care was time spent personally by me on the following activities: development of treatment plan with patient and/or surrogate as well as nursing, discussions with consultants, evaluation of patient's response to treatment, examination of patient, obtaining history from patient or surrogate, ordering and performing treatments and interventions, ordering and review of laboratory studies, ordering and review of radiographic studies, pulse oximetry and re-evaluation of patient's condition. ? ? ? ?MEDICATIONS ORDERED IN ED: ?Medications  ?HYDROcodone-acetaminophen (NORCO/VICODIN) 5-325 MG per tablet 1-2 tablet (has no administration in time range)  ?traZODone (DESYREL) tablet 25 mg (has no administration in time range)  ?docusate sodium (COLACE) capsule 200 mg (has no administration in time range)  ?saccharomyces boulardii (FLORASTOR) capsule 250 mg (has no administration in time range)  ?polyethylene glycol (MIRALAX / GLYCOLAX) packet 17 g (has no administration in time range)  ?pantoprazole (PROTONIX) EC tablet 40 mg (has no administration in time range)  ?vitamin B-12 (CYANOCOBALAMIN) tablet 1,000 mcg (has no administration in time range)  ?aspirin EC tablet 81  mg (has no administration in time range)  ?nitroGLYCERIN (NITROSTAT) SL tablet 0.4 mg (has no administration in time range)  ?acetaminophen (TYLENOL) tablet 650 mg (has no administration in time range)  ?ondansetron (ZOFRAN) injection 4 mg (has no administration in time range)  ?sodium chloride flush (NS) 0.9 % injection 3 mL (has no administration in time range)  ?sodium chloride flush (NS) 0.9 % injection 3 mL (has no administration in time range)  ?0.9 %  sodium chloride infusion (has no administration in time range)  ?atorvastatin (  LIPITOR) tablet 40 mg (has no administration in time range)  ?metoprolol tartrate (LOPRESSOR) tablet 12.5 mg (has no administration in time range)  ?aspirin chewable tablet 324 mg (324 mg Oral Given 04/10/21 0723)  ?sodium chloride 0.9 % bolus 500 mL (0 mLs Intravenous Stopped 04/10/21 0904)  ? ? ? ?IMPRESSION / MDM / ASSESSMENT AND PLAN / ED COURSE  ?I reviewed the triage vital signs and the nursing notes. ?             ?               ? ?Differential diagnosis includes, but is not limited to, acute MI and acute coronary syndrome.  STEMI team was activated prehospital appropriately, upon review obtain further clinical history and also consultation with our interventionalists Dr. Josefa Half at the bedside determine his measures made to cancel code STEMI.  Work-up medically, suspect likely the patient may have had an MI but what we are seeing today may not be acute given he is no longer and not having any chest pain or shortness of breath and the appearance of his EKG seems somewhat suggestive of possible subacute MI ? ?Differential does remain broad he has recently had surgery acute anemia other causes of ischemia fatigue are all considered.  Check electrolytes and check urinalysis, check chest x-ray, send D-dimer patient is at risk of pulmonary embolism having had surgery little over a month ago, and is slightly tachycardic here but not hypoxic and not complaining of shortness of breath.   My pretest probability overall though is low for PE ? ? ?The patient is on the cardiac monitor to evaluate for evidence of arrhythmia and/or significant heart rate changes. ? ?Patient awaiting urina

## 2021-04-10 NOTE — ED Triage Notes (Signed)
Ems activated stemi, no asa per ems, no co of cp, just general weakness, confusion , blood sugar 125 per ems ?

## 2021-04-10 NOTE — Assessment & Plan Note (Addendum)
Late presenting. ?Patient presents for evaluation of weakness and is found to have trop >24,000. ?Per his niece he had chest pain 2 days prior to presentation. ?Twelve-lead EKG shows ST elevations in V1 and V2 but also has Q waves consistent with subacute MI ?--complete 48 hours of heparin gtt  ?--defer heart cath, per cardio ?Plan: ?--cont ASA and plavix ?

## 2021-04-10 NOTE — Progress Notes (Signed)
Patient arrived from ED with HR above 100. Will implement MEWS protocol and give scheduled meds ? ? ? 04/10/21 1732  ?Assess: MEWS Score  ?Temp (!) 97.4 ?F (36.3 ?C)  ?BP 107/77  ?Pulse Rate (!) 111  ?Resp 17  ?Level of Consciousness Alert  ?SpO2 100 %  ?O2 Device Room Air  ?Assess: MEWS Score  ?MEWS Temp 0  ?MEWS Systolic 0  ?MEWS Pulse 2  ?MEWS RR 0  ?MEWS LOC 0  ?MEWS Score 2  ?MEWS Score Color Yellow  ?Assess: if the MEWS score is Yellow or Red  ?Were vital signs taken at a resting state? Yes  ?Focused Assessment No change from prior assessment  ?Does the patient meet 2 or more of the SIRS criteria? No  ?MEWS guidelines implemented *See Row Information* Yes  ?Treat  ?Pain Scale 0-10  ?Pain Score 0  ?Take Vital Signs  ?Increase Vital Sign Frequency  Yellow: Q 2hr X 2 then Q 4hr X 2, if remains yellow, continue Q 4hrs  ?Escalate  ?MEWS: Escalate Yellow: discuss with charge nurse/RN and consider discussing with provider and RRT  ?Notify: Charge Nurse/RN  ?Name of Charge Nurse/RN Notified Mendel Ryder RN  ?Date Charge Nurse/RN Notified 04/10/21  ?Time Charge Nurse/RN Notified 1816  ?Document  ?Patient Outcome Other (Comment)  ?Progress note created (see row info) Yes  ?Assess: SIRS CRITERIA  ?SIRS Temperature  0  ?SIRS Pulse 1  ?SIRS Respirations  0  ?SIRS WBC 0  ?SIRS Score Sum  1  ? ? ?

## 2021-04-10 NOTE — Consult Note (Signed)
ANTICOAGULATION CONSULT NOTE - Initial Consult ? ?Pharmacy Consult for Heparin ?Indication: chest pain/ACS ? ?No Known Allergies ? ?Patient Measurements: ?Height: '5\' 9"'$  (175.3 cm) ?Weight: 52 kg (114 lb 10.2 oz) ?IBW/kg (Calculated) : 70.7 ?Heparin Dosing Weight: 52 kg ? ?Vital Signs: ?Temp: 98.5 ?F (36.9 ?C) (03/31 0845) ?Temp Source: Oral (03/31 0845) ?BP: 110/78 (03/31 0845) ?Pulse Rate: 110 (03/31 0845) ? ?Labs: ?Recent Labs  ?  04/10/21 ?0720  ?HGB 11.4*  ?HCT 35.1*  ?PLT 453*  ?APTT 34  ?LABPROT 14.3  ?INR 1.1  ?CREATININE 1.76*  ?CKTOTAL 355  ?TROPONINIHS >24,000*  ? ? ?Estimated Creatinine Clearance: 21.7 mL/min (A) (by C-G formula based on SCr of 1.76 mg/dL (H)). ? ? ?Medical History: ?Past Medical History:  ?Diagnosis Date  ? Prostate cancer ([place])   ? ? ?Medications:  ?No history of chronic anticoagulation use PTA ? ?Assessment: ?Pharmacy has been consulted to initiate heparin in 88yo patient presenting to the ED with chest pain. EKG show ST elevations in V1 and V2 but also has Q waves consistent with subacute MI. Troponin level>24,000. Cardiology team has been consulted.  ? ?Baseline labs: aPTT 34 sec, INR 1.1, Hgb 11.4, Plts 453. ? ?Goal of Therapy:  ?Heparin level 0.3-0.7 units/ml ?Monitor platelets by anticoagulation protocol: Yes ?  ?Plan:  ?Give 3100 units bolus x 1 ?Start heparin infusion at 650 units/hr ?Check anti-Xa level in 8 hours and daily while on heparin ?Continue to monitor H&H and platelets ? ?Camile Esters A Jessalyn Hinojosa ?04/10/2021,10:04 AM ? ? ?

## 2021-04-10 NOTE — ED Notes (Signed)
Pt I=on cardiac fulll monitor , pt on zoll pads, ekg completed and review by dr Jacqualine Code , cardiology at bedside , stemi activation cancelled by cardiology  ?

## 2021-04-10 NOTE — Progress Notes (Signed)
?  Patient called for possible code STEMI.  Patient reports generalized weakness, and daughter had difficulty getting him out of bed this morning and called EMS.  ECG revealed Q waves and ST elevation in leads V1 and V2 consistent with recent anteroseptal myocardial infarction of unknown duration.  Patient completely asymptomatic, denies chest pain or shortness of breath.  In the absence of chest pain, it would be more prudent to evaluate the patient further, start heparin drip, cycle cardiac enzymes, and obtain 2D echocardiogram.  Code STEMI canceled. ?

## 2021-04-10 NOTE — Assessment & Plan Note (Addendum)
--  recent IM nail on 02/21/21.  Already had rehab and went back home.   ?--PT/OT ?

## 2021-04-10 NOTE — H&P (Addendum)
?History and Physical  ? ? ?Patient: Jamie Brennan YKD:[id number] DOB: 1931/09/06 ?DOA: 04/10/2021 ?DOS: the patient was seen and examined on 04/10/2021 ?PCP: Adin Hector, MD  ?Patient coming from: Home ? ?Chief Complaint:  ?Chief Complaint  ?Patient presents with  ? Code STEMI  ?  No cp stated by patient . Nkda, no asa per ems, , 0500 per family more weak, confused   ? ?Most of the history was obtained from patient's niece over the phone ?HPI: Jamie Brennan is a 88 y.o. male with medical history significant for prostate cancer, s/p recent left hip arthroplasty who presents to the ER for evaluation of weakness and confusion. ?Patient is status post left total hip arthroplasty and has been getting physical therapy at home.  According to his niece he had chest pain 2 days prior to presentation but denied having any other symptoms.  Over the last 2 days he has become increasingly weak and has been unable to ambulate with his rolling walker.  He has been mostly wheelchair-bound. ?During my evaluation he is completely asymptomatic and denies having any chest pain, no shortness of breath, no nausea, no vomiting, no palpitations or diaphoresis. ?His twelve-lead EKG upon arrival to the ER showed Q waves and ST elevations in leads V1 and V2 consistent with recent anteroseptal MI ?Code STEMI was called and patient was seen by cardiology who canceled the code since patient is not having any acute symptoms at this time. ?Review of Systems: As mentioned in the history of present illness. All other systems reviewed and are negative. ?Past Medical History:  ?Diagnosis Date  ? Prostate cancer ([place])   ? ?Past Surgical History:  ?Procedure Laterality Date  ? BACK SURGERY    ? INTRAMEDULLARY (IM) NAIL INTERTROCHANTERIC Left 02/21/2021  ? Procedure: INTRAMEDULLARY (IM) NAIL INTERTROCHANTRIC;  Surgeon: Renee Harder, MD;  Location: [facility] ORS;  Service: Orthopedics;  Laterality: Left;  ? PROSTATECTOMY    ? ?Social History:   reports that he has never smoked. He has never used smokeless tobacco. He reports that he does not currently use alcohol. He reports that he does not use drugs. ? ?No Known Allergies ? ?No family history on file. ? ?Prior to Admission medications   ?Medication Sig Start Date End Date Taking? Authorizing Provider  ?acetaminophen (TYLENOL) 500 MG tablet Take 1 tablet (500 mg total) by mouth every 6 (six) hours as needed for mild pain, moderate pain, fever or headache. 02/27/21   Eugenie Filler, MD  ?azelastine (ASTELIN) 0.1 % nasal spray Place 1-2 sprays into both nostrils 2 (two) times daily. 11/17/20   [provider]  ?docusate sodium (COLACE) 100 MG capsule Take 2 capsules (200 mg total) by mouth 2 (two) times daily. 02/27/21   Eugenie Filler, MD  ?FLORASTOR 250 MG capsule Take 250 mg by mouth 2 (two) times daily. 03/27/21   [provider]  ?HYDROcodone-acetaminophen (NORCO/VICODIN) 5-325 MG tablet Take 1-2 tablets by mouth every 6 (six) hours as needed for moderate pain. 02/27/21   Eugenie Filler, MD  ?polyethylene glycol (MIRALAX / GLYCOLAX) 17 g packet Take 17 g by mouth daily as needed for mild constipation or moderate constipation. 02/27/21   Eugenie Filler, MD  ?PROTONIX 40 MG tablet Take 40 mg by mouth daily. 03/16/21   [provider]  ?traZODone (DESYREL) 50 MG tablet Take 0.5 tablets (25 mg total) by mouth at bedtime as needed for sleep. 02/27/21   Eugenie Filler, MD  ?  vitamin B-12 (CYANOCOBALAMIN) 1000 MCG tablet Take 1 tablet (1,000 mcg total) by mouth daily. 02/27/21   Eugenie Filler, MD  ?VOLTAREN 1 % GEL Apply 1 application. topically 2 (two) times daily. 03/16/21   [provider]  ? ? ?Physical Exam: ?Vitals:  ? 04/10/21 0718 04/10/21 0845  ?BP: 102/82 110/78  ?Pulse: (!) 110 (!) 110  ?Resp: 18 17  ?Temp: 98.5 ?F (36.9 ?C) 98.5 ?F (36.9 ?C)  ?TempSrc: Oral Oral  ?SpO2: 98% 98%  ?Weight: 52 kg   ?Height: '5\' 9"'$  (1.753 m)   ? ?Physical Exam ?Vitals  and nursing note reviewed.  ?Constitutional:   ?   Appearance: Normal appearance.  ?HENT:  ?   Head: Normocephalic and atraumatic.  ?   Nose: Nose normal.  ?   Mouth/Throat:  ?   Mouth: Mucous membranes are moist.  ?Eyes:  ?   Pupils: Pupils are equal, round, and reactive to light.  ?Cardiovascular:  ?   Rate and Rhythm: Tachycardia present.  ?Pulmonary:  ?   Effort: Pulmonary effort is normal.  ?   Breath sounds: Normal breath sounds.  ?Abdominal:  ?   General: Abdomen is flat. Bowel sounds are normal.  ?   Palpations: Abdomen is soft.  ?Musculoskeletal:     ?   General: Normal range of motion.  ?   Cervical back: Normal range of motion and neck supple.  ?Skin: ?   General: Skin is warm and dry.  ?Neurological:  ?   General: No focal deficit present.  ?   Mental Status: He is alert.  ?   Comments: Oriented only to person  ?Psychiatric:     ?   Mood and Affect: Mood normal.     ?   Behavior: Behavior normal.  ? ? ?Data Reviewed: ?Relevant notes from primary care and specialist visits, past discharge summaries as available in EHR, including Care Everywhere. ?Prior diagnostic testing as pertinent to current admission diagnoses ?Updated medications and problem lists for reconciliation ?ED course, including vitals, labs, imaging, treatment and response to treatment ?Triage notes, nursing and pharmacy notes and ED provider's notes ?Notable results as noted in HPI ?Labs reviewed.  BNP 1548.3, sodium 129, potassium 4.7, chloride 94, bicarb 23, glucose 150, BUN 36, creatinine 1.76, calcium 8.5, troponin greater than 24,000, white count 18.4, hemoglobin 11.4, hematocrit 35.1, platelet count 453, D-dimer 2.09, lactic acid 3.1, total CK 355 ?Chest x-ray reviewed by me shows low lung volumes with bilateral pulmonary opacities ?CT scan of the head without contrast shows no acute intracranial abnormality. ?There are no new results to review at this time. ? ?Assessment and Plan: ?* Acute ST elevation myocardial infarction (STEMI)  ([place]) ?Patient presents for evaluation of weakness and is found to have significantly elevated troponin levels. ?Per his niece he had chest pain 2 days prior to presentation. ?Twelve-lead EKG shows ST elevations in V1 and V2 but also has Q waves consistent with subacute MI ?Appreciate cardiology input ?Continue aspirin, beta-blockers as tolerated and statins ?Obtain 2D echocardiogram to assess LVEF and rule out regional wall motion abnormality ? ?Dehydration with hyponatremia ?Most likely related to poor oral intake ?Patient noted to have slight worsening of his renal function from baseline 1.5 >> 1.7 with elevated BUN ?Judicious IV fluid resuscitation ? ?AMS (altered mental status) ?Patient is only oriented to person not to place or time which is not his baseline. ?He has marked leukocytosis. ?UA is pending and chest x-ray does not show any  evidence of an infectious process ?Follow-up results of UA ? ?Closed left hip fracture ([place]) ?Pain control ?Resume physical therapy when able ?Fall precautions ? ? ? ? ? Advance Care Planning:   Code Status: DNR  ? ?Consults: Cardiology ? ?Family Communication: Greater than 50% of time was spent discussing patient's condition and plan of care with his niece Donald Prose over the phone.  All questions and concerns have been addressed.  She verbalizes understanding and agrees with the plan.  CODE STATUS was discussed and patient is a DO NOT RESUSCITATE. ? ?Severity of Illness: ?The appropriate patient status for this patient is INPATIENT. Inpatient status is judged to be reasonable and necessary in order to provide the required intensity of service to ensure the patient's safety. The patient's presenting symptoms, physical exam findings, and initial radiographic and laboratory data in the context of their chronic comorbidities is felt to place them at high risk for further clinical deterioration. Furthermore, it is not anticipated that the patient will be medically stable for  discharge from the hospital within 2 midnights of admission.  ? ?* I certify that at the point of admission it is my clinical judgment that the patient will require inpatient hospital care spanning beyond

## 2021-04-10 NOTE — Progress Notes (Signed)
*  PRELIMINARY RESULTS* ?Echocardiogram ?2D Echocardiogram has been performed. ? ?Joelene Millin ?04/10/2021, 8:38 AM ?

## 2021-04-10 NOTE — ED Notes (Signed)
Dr Jacqualine Code notified on troponin >24000 and lactate 3.1 ?

## 2021-04-11 DIAGNOSIS — N1832 Chronic kidney disease, stage 3b: Secondary | ICD-10-CM

## 2021-04-11 DIAGNOSIS — I5021 Acute systolic (congestive) heart failure: Secondary | ICD-10-CM

## 2021-04-11 DIAGNOSIS — I213 ST elevation (STEMI) myocardial infarction of unspecified site: Secondary | ICD-10-CM | POA: Diagnosis not present

## 2021-04-11 LAB — BASIC METABOLIC PANEL
Anion gap: 11 (ref 5–15)
BUN: 40 mg/dL — ABNORMAL HIGH (ref 8–23)
CO2: 23 mmol/L (ref 22–32)
Calcium: 8.3 mg/dL — ABNORMAL LOW (ref 8.9–10.3)
Chloride: 98 mmol/L (ref 98–111)
Creatinine, Ser: 1.74 mg/dL — ABNORMAL HIGH (ref 0.61–1.24)
GFR, Estimated: 37 mL/min — ABNORMAL LOW (ref >60)
Glucose, Bld: 118 mg/dL — ABNORMAL HIGH (ref 70–99)
Potassium: 4.2 mmol/L (ref 3.5–5.1)
Sodium: 132 mmol/L — ABNORMAL LOW (ref 135–145)

## 2021-04-11 LAB — CBC
HCT: 32.3 % — ABNORMAL LOW (ref 39.0–52.0)
Hemoglobin: 10.4 g/dL — ABNORMAL LOW (ref 13.0–17.0)
MCH: 29.8 pg (ref 26.0–34.0)
MCHC: 32.2 g/dL (ref 30.0–36.0)
MCV: 92.6 fL (ref 80.0–100.0)
Platelets: 385 10*3/uL (ref 150–400)
RBC: 3.49 MIL/uL — ABNORMAL LOW (ref 4.22–5.81)
RDW: 14.6 % (ref 11.5–15.5)
WBC: 12.9 10*3/uL — ABNORMAL HIGH (ref 4.0–10.5)
nRBC: 0 % (ref 0.0–0.2)

## 2021-04-11 LAB — HEPARIN LEVEL (UNFRACTIONATED)
Heparin Unfractionated: <0.1 IU/mL — ABNORMAL LOW (ref 0.30–0.70)
Heparin Unfractionated: 0.12 IU/mL — ABNORMAL LOW (ref 0.30–0.70)
Heparin Unfractionated: 0.25 IU/mL — ABNORMAL LOW (ref 0.30–0.70)

## 2021-04-11 LAB — LIPID PANEL
Cholesterol: 136 mg/dL (ref 0–200)
HDL: 45 mg/dL (ref >40)
LDL Cholesterol: 76 mg/dL (ref 0–99)
Total CHOL/HDL Ratio: 3 RATIO
Triglycerides: 77 mg/dL (ref <150)
VLDL: 15 mg/dL (ref 0–40)

## 2021-04-11 MED ORDER — METOPROLOL SUCCINATE ER 25 MG PO TB24: 25.0000 mg | ORAL_TABLET | Freq: Every day | ORAL | Status: DC

## 2021-04-11 MED ORDER — HEPARIN BOLUS VIA INFUSION
750.0000 [IU] | Freq: Once | INTRAVENOUS | Status: AC
  Administered 2021-04-12: 750 [IU] via INTRAVENOUS
  Filled 2021-04-11: qty 750

## 2021-04-11 MED ORDER — HEPARIN BOLUS VIA INFUSION
1500.0000 [IU] | Freq: Once | INTRAVENOUS | Status: AC
  Administered 2021-04-11: 1500 [IU] via INTRAVENOUS
  Filled 2021-04-11: qty 1500

## 2021-04-11 MED ORDER — HEPARIN BOLUS VIA INFUSION
1550.0000 [IU] | Freq: Once | INTRAVENOUS | Status: AC
  Administered 2021-04-11: 1550 [IU] via INTRAVENOUS
  Filled 2021-04-11: qty 1550

## 2021-04-11 MED ORDER — METOPROLOL SUCCINATE ER 25 MG PO TB24
12.5000 mg | ORAL_TABLET | Freq: Every day | ORAL | Status: DC
  Administered 2021-04-11 – 2021-04-17 (×6): 12.5 mg via ORAL
  Filled 2021-04-11 (×7): qty 1

## 2021-04-11 NOTE — Consult Note (Signed)
ANTICOAGULATION CONSULT NOTE - Initial Consult ? ?Pharmacy Consult for Heparin ?Indication: chest pain/ACS ? ?No Known Allergies ? ?Patient Measurements: ?Height: '5\' 9"'$  (175.3 cm) ?Weight: 58.6 kg (129 lb 3 oz) ?IBW/kg (Calculated) : 70.7 ?Heparin Dosing Weight: 52 kg ? ?Vital Signs: ?Temp: 98.1 ?F (36.7 ?C) (04/01 0410) ?BP: 99/75 (04/01 0410) ?Pulse Rate: 98 (04/01 0410) ? ?Labs: ?Recent Labs  ?  04/10/21 ?0720 04/10/21 ?1923 04/11/21 ?6301  ?HGB 11.4*  --  10.4*  ?HCT 35.1*  --  32.3*  ?PLT 453*  --  385  ?APTT 34  --   --   ?LABPROT 14.3  --   --   ?INR 1.1  --   --   ?HEPARINUNFRC  --  <0.10* <0.10*  ?CREATININE 1.76*  --  1.74*  ?CKTOTAL 355  --   --   ?TROPONINIHS >24,000*  --   --   ? ? ? ?Estimated Creatinine Clearance: 24.8 mL/min (A) (by C-G formula based on SCr of 1.74 mg/dL (H)). ? ? ?Medical History: ?Past Medical History:  ?Diagnosis Date  ? Prostate cancer ([place])   ? ? ?Medications:  ?No history of chronic anticoagulation use PTA ? ?Assessment: ?Pharmacy has been consulted to initiate heparin in 89yo patient presenting to the ED with chest pain. EKG show ST elevations in V1 and V2 but also has Q waves consistent with subacute MI. Troponin level>24,000. Cardiology team has been consulted.  ? ?3/31 HL 1923 <0.10, subtherapeutic. Confirmed with nurse infusion was not paused and no issues with line.  ? ?Goal of Therapy:  ?Heparin level 0.3-0.7 units/ml ?Monitor platelets by anticoagulation protocol: Yes ?  ?Plan:  ?4/1:  HL @ 0523 = < 0.1, SUBtherapeutic  ?Will order heparin 1550 units IV X 1 bolus and increase drip rate to 1050 units/hr.  ?Will recheck HL 8 hrs after rate change.  ? ?Derry Kassel D ?04/11/2021,7:18 AM ? ? ?

## 2021-04-11 NOTE — Progress Notes (Signed)
PT Cancellation Note ? ?Patient Details ?Name: Jamie Brennan ?MRN: [id number] ?DOB: August 25, 1931 ? ? ?Cancelled Treatment:    Reason Eval/Treat Not Completed: Medical issues which prohibited therapy.  On heparin drip, will see tomorrow when discontinued. ? ? ?Ramond Dial ?04/11/2021, 11:17 AM ? ?Mee Hives, PT PhD ?Acute Rehab Dept. Number: [facility] [phone] and [place] [phone] ? ?

## 2021-04-11 NOTE — Progress Notes (Signed)
[facility] ? ?SUBJECTIVE: Patient laying in bed, denies chest pain or shortness of breath ? ? ?Vitals:  ? 04/10/21 2028 04/11/21 0410 04/11/21 0539 04/11/21 9741  ?BP: 101/76 99/75  105/73  ?Pulse: (!) 107 98  97  ?Resp: '20 18  18  '$ ?Temp: 98.8 ?F (37.1 ?C) 98.1 ?F (36.7 ?C)  97.9 ?F (36.6 ?C)  ?TempSrc:      ?SpO2: 98% 98%  100%  ?Weight:   58.6 kg   ?Height:      ? ? ? ?Intake/Output Summary (Last 24 hours) at 04/11/2021 0936 ?Last data filed at 04/11/2021 0630 ?Gross per 24 hour  ?Intake 1143.32 ml  ?Output --  ?Net 1143.32 ml  ? ? ? ? ?PHYSICAL EXAM ? ?General: Well developed, well nourished, in no acute distress ?HEENT:  Normocephalic and atramatic ?Neck:  No JVD.  ?Lungs: Clear bilaterally to auscultation and percussion. ?Heart: HRRR . Normal S1 and S2 without gallops or murmurs.  ?Abdomen: Bowel sounds are positive, abdomen soft and non-tender  ?Msk:  Back normal, normal gait. Normal strength and tone for age. ?Extremities: No clubbing, cyanosis or edema.   ?Neuro: Alert and oriented X 3. ?Psych:  Good affect, responds appropriately ? ? ?LABS: ?Basic Metabolic Panel: ?Recent Labs  ?  04/10/21 ?0720 04/11/21 ?6384  ?NA 129* 132*  ?K 4.7 4.2  ?CL 94* 98  ?CO2 23 23  ?GLUCOSE 150* 118*  ?BUN 36* 40*  ?CREATININE 1.76* 1.74*  ?CALCIUM 8.5* 8.3*  ? ?Liver Function Tests: ?No results for input(s): AST, ALT, ALKPHOS, BILITOT, PROT, ALBUMIN in the last 72 hours. ?No results for input(s): LIPASE, AMYLASE in the last 72 hours. ?CBC: ?Recent Labs  ?  04/10/21 ?0720 04/11/21 ?5364  ?WBC 18.4* 12.9*  ?NEUTROABS 15.5*  --   ?HGB 11.4* 10.4*  ?HCT 35.1* 32.3*  ?MCV 90.5 92.6  ?PLT 453* 385  ? ?Cardiac Enzymes: ?Recent Labs  ?  04/10/21 ?0720  ?CKTOTAL 355  ? ?BNP: ?Invalid input(s): POCBNP ?D-Dimer: ?Recent Labs  ?  04/10/21 ?0720  ?DDIMER 2.09*  ? ?Hemoglobin A1C: ?No results for input(s): HGBA1C in the last 72 hours. ?Fasting Lipid Panel: ?Recent Labs  ?  04/11/21 ?6803  ?CHOL 136  ?HDL 45  ?[place] 76  ?TRIG 77  ?CHOLHDL  3.0  ? ?Thyroid Function Tests: ?No results for input(s): TSH, T4TOTAL, T3FREE, THYROIDAB in the last 72 hours. ? ?Invalid input(s): FREET3 ?Anemia Panel: ?No results for input(s): VITAMINB12, FOLATE, FERRITIN, TIBC, IRON, RETICCTPCT in the last 72 hours. ? ?CT Head Wo Contrast ? ?Result Date: 04/10/2021 ?CLINICAL DATA:  Generalized weakness EXAM: CT HEAD WITHOUT CONTRAST TECHNIQUE: Contiguous axial images were obtained from the base of the skull through the vertex without intravenous contrast. RADIATION DOSE REDUCTION: This exam was performed according to the departmental dose-optimization program which includes automated exposure control, adjustment of the mA and/or kV according to patient size and/or use of iterative reconstruction technique. COMPARISON:  CT head 02/20/2021 FINDINGS: Brain: There is no evidence of acute intracranial hemorrhage, extra-axial fluid collection, or acute infarct. There is moderate global parenchymal volume loss with prominence of the ventricular system and extra-axial CSF spaces, unchanged. The ventricular system is stable in size and configuration. Gray-white differentiation is preserved. Patchy hypodensity in the subcortical and periventricular white matter likely reflects sequela of chronic white matter microangiopathy. There are small remote lacunar infarcts in the left basal ganglia/insula, unchanged. There is no mass lesion.  There is no mass effect or midline shift. Vascular: No  hyperdense vessel or unexpected calcification. Skull: Normal. Negative for fracture or focal lesion. Sinuses/Orbits: Imaged paranasal sinuses are clear. Bilateral lens implants are in place. The globes and orbits are otherwise unremarkable. Other: None. IMPRESSION: No acute intracranial pathology. Electronically Signed   By: Valetta Mole M.D.   On: 04/10/2021 09:20  ? ?DG Chest Portable 1 View ? ?Result Date: 04/10/2021 ?CLINICAL DATA:  Weakness. EXAM: PORTABLE CHEST 1 VIEW COMPARISON:  03/28/2021  FINDINGS: The cardiomediastinal silhouette is unchanged with normal heart size. Lung volumes are low with mild opacity in both lung bases. No sizable pleural effusion or pneumothorax is identified. No acute osseous abnormality is seen. IMPRESSION: Low lung volumes with bibasilar opacities, likely atelectasis. Electronically Signed   By: Logan Bores M.D.   On: 04/10/2021 07:42  ? ?ECHOCARDIOGRAM COMPLETE ? ?Result Date: 04/10/2021 ?   ECHOCARDIOGRAM REPORT   Patient Name:   Jamie Brennan Date of Exam: 04/10/2021 Medical Rec #:  [id number]          Height:       69.0 in Accession #:    [id number]         Weight:       114.6 lb Date of Birth:  1931-07-03          BSA:          1.631 m? Patient Age:    88 years           BP:           144/78 mmHg Patient Gender: M                  HR:           96 bpm. Exam Location:  [facility] Procedure: 2D Echo, Cardiac Doppler and Color Doppler Indications:     Acute myocardial infarction, unspecified I21.9  History:         Patient has no prior history of Echocardiogram examinations.                  Acute ST elevation myocardial infarction (STEMI).  Sonographer:     Luane School RDCS Sonographer#2:   Sherrie Sport Referring Phys:  [place] Diagnosing Phys: Isaias Cowman MD IMPRESSIONS  1. Left ventricular ejection fraction, by estimation, is 30 to 35%. The left ventricle has moderately decreased function. The left ventricle demonstrates regional wall motion abnormalities (see scoring diagram/findings for description). Left ventricular  diastolic parameters are consistent with Grade I diastolic dysfunction (impaired relaxation).  2. Right ventricular systolic function is normal. The right ventricular size is normal.  3. The mitral valve is normal in structure. Mild to moderate mitral valve regurgitation. No evidence of mitral stenosis.  4. The aortic valve is normal in structure. Aortic valve regurgitation is not visualized. No aortic stenosis is present.  5. The  inferior vena cava is normal in size with greater than 50% respiratory variability, suggesting right atrial pressure of 3 mmHg. FINDINGS  Left Ventricle: Left ventricular ejection fraction, by estimation, is 30 to 35%. The left ventricle has moderately decreased function. The left ventricle demonstrates regional wall motion abnormalities. The left ventricular internal cavity size was normal in size. There is no left ventricular hypertrophy. Left ventricular diastolic parameters are consistent with Grade I diastolic dysfunction (impaired relaxation).  LV Wall Scoring: The apical lateral segment, apical septal segment, and apex are akinetic. Right Ventricle: The right ventricular size is normal. No increase in right ventricular wall thickness. Right ventricular systolic  function is normal. Left Atrium: Left atrial size was normal in size. Right Atrium: Right atrial size was normal in size. Pericardium: There is no evidence of pericardial effusion. Mitral Valve: The mitral valve is normal in structure. Mild to moderate mitral valve regurgitation. No evidence of mitral valve stenosis. Tricuspid Valve: The tricuspid valve is normal in structure. Tricuspid valve regurgitation is mild . No evidence of tricuspid stenosis. Aortic Valve: The aortic valve is normal in structure. Aortic valve regurgitation is not visualized. No aortic stenosis is present. Pulmonic Valve: The pulmonic valve was normal in structure. Pulmonic valve regurgitation is not visualized. No evidence of pulmonic stenosis. Aorta: The aortic root is normal in size and structure. Venous: The inferior vena cava is normal in size with greater than 50% respiratory variability, suggesting right atrial pressure of 3 mmHg. IAS/Shunts: No atrial level shunt detected by color flow Doppler.  LEFT VENTRICLE PLAX 2D LVIDd:         5.00 cm LVIDs:         4.40 cm LV PW:         0.90 cm LV IVS:        0.80 cm LVOT diam:     2.00 cm LV SV:         24 LV SV Index:   15 LVOT  Area:     3.14 cm?  LV Volumes (MOD) LV vol d, MOD A2C: 116.0 ml LV vol d, MOD A4C: 117.0 ml LV vol s, MOD A2C: 92.8 ml LV vol s, MOD A4C: 72.9 ml LV SV MOD A2C:     23.2 ml LV SV MOD A4C:     117.0 ml LV SV MO

## 2021-04-11 NOTE — Consult Note (Addendum)
ANTICOAGULATION CONSULT NOTE - Initial Consult ? ?Pharmacy Consult for Heparin ?Indication: chest pain/ACS ? ?No Known Allergies ? ?Patient Measurements: ?Height: '5\' 9"'$  (175.3 cm) ?Weight: 58.6 kg (129 lb 3 oz) ?IBW/kg (Calculated) : 70.7 ?Heparin Dosing Weight: 52 kg ? ?Vital Signs: ?Temp: 97.7 ?F (36.5 ?C) (04/01 1152) ?Temp Source: Oral (04/01 1152) ?BP: 95/68 (04/01 1152) ?Pulse Rate: 90 (04/01 1152) ? ?Labs: ?Recent Labs  ?  04/10/21 ?0720 04/10/21 ?1923 04/11/21 ?2395 04/11/21 ?1445  ?HGB 11.4*  --  10.4*  --   ?HCT 35.1*  --  32.3*  --   ?PLT 453*  --  385  --   ?APTT 34  --   --   --   ?LABPROT 14.3  --   --   --   ?INR 1.1  --   --   --   ?HEPARINUNFRC  --  <0.10* <0.10* 0.12*  ?CREATININE 1.76*  --  1.74*  --   ?CKTOTAL 355  --   --   --   ?TROPONINIHS >24,000*  --   --   --   ? ? ? ?Estimated Creatinine Clearance: 24.8 mL/min (A) (by C-G formula based on SCr of 1.74 mg/dL (H)). ? ? ?Medical History: ?Past Medical History:  ?Diagnosis Date  ? Prostate cancer ([place])   ? ? ?Medications:  ?No history of chronic anticoagulation use PTA ? ?Assessment: ?Pharmacy has been consulted to initiate heparin in 89yo patient presenting to the ED with chest pain. EKG show ST elevations in V1 and V2 but also has Q waves consistent with subacute MI. Troponin level>24,000. Cardiology team has been consulted.  ? ?Goal of Therapy:  ?Heparin level 0.3-0.7 units/ml ?Monitor platelets by anticoagulation protocol: Yes ?  ?Plan:  ?Heparin level is SUBtherapeutic. Will order heparin 1500 units IV X 1 bolus and increase drip rate to 1250 units/hr. Will recheck HL 8 hrs after rate change.  ? ?Forde Dandy Cataldo Cosgriff ?04/11/2021,3:20 PM ? ? ?

## 2021-04-11 NOTE — Plan of Care (Signed)
?  Problem: Clinical Measurements: ?Goal: Ability to maintain clinical measurements within normal limits will improve ?Outcome: Progressing ?  ?Problem: Elimination: ?Goal: Will not experience complications related to urinary retention ?Outcome: Progressing ? SR/ST noted on telemetry.  SBP 90s-100s.  Voiding incontinent.  No voiced complaints of pain.  Able to assist with movement in bed. ?

## 2021-04-11 NOTE — Assessment & Plan Note (Addendum)
--  Cr at baseline around 1.7 ?

## 2021-04-11 NOTE — Progress Notes (Signed)
IV Heparin dose changed to 13.5 ml.hr ?

## 2021-04-11 NOTE — Progress Notes (Signed)
?  Progress Note ? ? ?Patient: Jamie Brennan PJA:[id number] DOB: [birth year] DOA: 04/10/2021     1 ?DOS: the patient was seen and examined on 04/11/2021 ?  ?Brief hospital course: ?No notes on file ? ?Assessment and Plan: ?* Acute ST elevation myocardial infarction (STEMI) ([place]) ?Late presenting. ?Patient presents for evaluation of weakness and is found to have trop >24,000. ?Per his niece he had chest pain 2 days prior to presentation. ?Twelve-lead EKG shows ST elevations in V1 and V2 but also has Q waves consistent with subacute MI ?Plan: ?--cont heparin gtt ?--defer heart cath, per cardio ?--cont ASA and plavix ? ?Dehydration with hyponatremia ?Most likely related to poor oral intake ?--improved with gentle IV hydration ? ?Acute systolic CHF (congestive heart failure) ([place]) ?--Echo showed LVEF 30 to 35%, anterior apical dyskinesis.  Possibly acute due to STEMI. ?Plan: ?--convert metop tartrate to succinate, per cardio ?--hold off adding ACE due to soft BP ? ? ?Stage 3b chronic kidney disease (CKD) ([place]) ?--Cr at baseline around 1.7 ? ?AMS (altered mental status) ?--improved. ? ?Closed left hip fracture ([place]) ?Pain control ?Resume physical therapy when able ?Fall precautions ? ? ? ? ?  ? ?Subjective:  ?Pt continued to deny chest pain.  Complained of left ankle pain. ? ? ?Physical Exam: ? ?Constitutional: NAD, AAOx3 ?HEENT: conjunctivae and lids normal, EOMI ?CV: No cyanosis.   ?RESP: normal respiratory effort, on RA ?Extremities: No effusions, edema in BLE ?SKIN: warm, dry ?Neuro: II - XII grossly intact.   ?Psych: Normal mood and affect.  Appropriate judgement and reason ? ? ?Data Reviewed: ? ?Family Communication:  ? ?Disposition: ?Status is: Inpatient ? ? Planned Discharge Destination: Home ? ? ? ?Time spent: 50 minutes ? ?Author: ?Enzo Bi, MD ?04/11/2021 2:46 PM ? ?For on call review [email or web address].  ?

## 2021-04-11 NOTE — Consult Note (Signed)
ANTICOAGULATION CONSULT NOTE - Initial Consult ? ?Pharmacy Consult for Heparin ?Indication: chest pain/ACS ? ?No Known Allergies ? ?Patient Measurements: ?Height: '5\' 9"'$  (175.3 cm) ?Weight: 58.6 kg (129 lb 3 oz) ?IBW/kg (Calculated) : 70.7 ?Heparin Dosing Weight: 52 kg ? ?Vital Signs: ?Temp: 97.8 ?F (36.6 ?C) (04/01 1959) ?Temp Source: Oral (04/01 1959) ?BP: 94/70 (04/01 1959) ?Pulse Rate: 99 (04/01 1959) ? ?Labs: ?Recent Labs  ?  04/10/21 ?0720 04/10/21 ?1923 04/11/21 ?3220 04/11/21 ?1445 04/11/21 ?2252  ?HGB 11.4*  --  10.4*  --   --   ?HCT 35.1*  --  32.3*  --   --   ?PLT 453*  --  385  --   --   ?APTT 34  --   --   --   --   ?LABPROT 14.3  --   --   --   --   ?INR 1.1  --   --   --   --   ?HEPARINUNFRC  --    < > <0.10* 0.12* 0.25*  ?CREATININE 1.76*  --  1.74*  --   --   ?CKTOTAL 355  --   --   --   --   ?TROPONINIHS >24,000*  --   --   --   --   ? < > = values in this interval not displayed.  ? ? ? ?Estimated Creatinine Clearance: 24.8 mL/min (A) (by C-G formula based on SCr of 1.74 mg/dL (H)). ? ? ?Medical History: ?Past Medical History:  ?Diagnosis Date  ? Prostate cancer ([place])   ? ? ?Medications:  ?No history of chronic anticoagulation use PTA ? ?Assessment: ?Pharmacy has been consulted to initiate heparin in 89yo patient presenting to the ED with chest pain. EKG show ST elevations in V1 and V2 but also has Q waves consistent with subacute MI. Troponin level>24,000. Cardiology team has been consulted.  ? ?Goal of Therapy:  ?Heparin level 0.3-0.7 units/ml ?Monitor platelets by anticoagulation protocol: Yes ?  ?Plan:  ?4/1:  HL @ 2252 = 0.25, SUBtherapeutic  ?Will order heparin 750 units IV X 1 bolus and increase drip rate to 1350 units/hr.  ?Will order HL 8 hrs after rate change.  ? ?Halley Shepheard D ?04/11/2021,11:39 PM ? ? ?

## 2021-04-11 NOTE — Progress Notes (Signed)
OT Cancellation Note ? ?Patient Details ?Name: Jamie Brennan ?MRN: [id number] ?DOB: 1931-02-04 ? ? ?Cancelled Treatment:    Reason Eval/Treat Not Completed: Medical issues which prohibited therapy  On heparin drip, will see tomorrow when discontinued ? ?Rosalyn Gess OTR/L,CLT ?04/11/2021, 12:54 PM ?

## 2021-04-11 NOTE — Assessment & Plan Note (Addendum)
--  Echo showed LVEF 30 to 35%, anterior apical dyskinesis.  Possibly acute due to STEMI. ?--appeared compensated.  ?Plan: ?--cont Toprol ?--hold off adding ACE due to soft BP ?--No need for diuresis currently ? ?

## 2021-04-12 DIAGNOSIS — I213 ST elevation (STEMI) myocardial infarction of unspecified site: Secondary | ICD-10-CM | POA: Diagnosis not present

## 2021-04-12 LAB — CBC
HCT: 28.6 % — ABNORMAL LOW (ref 39.0–52.0)
Hemoglobin: 9.5 g/dL — ABNORMAL LOW (ref 13.0–17.0)
MCH: 29.9 pg (ref 26.0–34.0)
MCHC: 33.2 g/dL (ref 30.0–36.0)
MCV: 89.9 fL (ref 80.0–100.0)
Platelets: 368 10*3/uL (ref 150–400)
RBC: 3.18 MIL/uL — ABNORMAL LOW (ref 4.22–5.81)
RDW: 14.4 % (ref 11.5–15.5)
WBC: 12.2 10*3/uL — ABNORMAL HIGH (ref 4.0–10.5)
nRBC: 0 % (ref 0.0–0.2)

## 2021-04-12 LAB — BASIC METABOLIC PANEL
Anion gap: 8 (ref 5–15)
BUN: 39 mg/dL — ABNORMAL HIGH (ref 8–23)
CO2: 25 mmol/L (ref 22–32)
Calcium: 8.1 mg/dL — ABNORMAL LOW (ref 8.9–10.3)
Chloride: 99 mmol/L (ref 98–111)
Creatinine, Ser: 1.68 mg/dL — ABNORMAL HIGH (ref 0.61–1.24)
GFR, Estimated: 39 mL/min — ABNORMAL LOW (ref >60)
Glucose, Bld: 125 mg/dL — ABNORMAL HIGH (ref 70–99)
Potassium: 3.9 mmol/L (ref 3.5–5.1)
Sodium: 132 mmol/L — ABNORMAL LOW (ref 135–145)

## 2021-04-12 LAB — MAGNESIUM: Magnesium: 2.2 mg/dL (ref 1.7–2.4)

## 2021-04-12 LAB — HEPARIN LEVEL (UNFRACTIONATED): Heparin Unfractionated: 0.55 IU/mL (ref 0.30–0.70)

## 2021-04-12 NOTE — Evaluation (Signed)
Occupational Therapy Evaluation ?Patient Details ?Name: Jamie Brennan ?MRN: [id number] ?DOB: 01-01-1932 ?Today's Date: 04/12/2021 ? ? ?History of Present Illness Pt is an 88 y/o M admitted on 04/10/21 after presenting to the ER for evaluation of waekness & confusion. Pt also c/o chest pain for 2 days prior to arriving to ED. Pt found to have STEMI, late presentation. PMH: prostate CA, s/p recent L THA  ? ?Clinical Impression ?  ?Chart reviewed, RN cleared pt for participation in OT evaluation. Co tx completed with PT. PTA pt was in rehab for L THA following fall, discharged to nieces home where he was living on the first floor with some assist for ADL/IADL. Pt required MAX A +2 for supine>sit, poor sitting balance. Pt transfers with MAX A+2 via SPT to bedside commode for BM, requires TOTAL A for peri care. Pt with short amb transfer to bedside chair with MAX A +2 with step by step vcs for weight shift and RW use. Pt performs grooming/UB bathing/dressing tasks in sitting with SET UP- MINA. Pt is performing ADL below PLOF, will benefit from ongoing OT To address functional deficits. Pt is left in bedside chair, NAD, all needs met. OT will continue to follow.  ?   ? ?Recommendations for follow up therapy are one component of a multi-disciplinary discharge planning process, led by the attending physician.  Recommendations may be updated based on patient status, additional functional criteria and insurance authorization.  ? ?Follow Up Recommendations ? Skilled nursing-short term rehab (<3 hours/day)  ?  ?Assistance Recommended at Discharge Frequent or constant Supervision/Assistance  ?Patient can return home with the following A lot of help with walking and/or transfers;A lot of help with bathing/dressing/bathroom;Assistance with cooking/housework;Direct supervision/assist for financial management;Assist for transportation;Help with stairs or ramp for entrance;Direct supervision/assist for medications management ? ?   ?Functional Status Assessment ? Patient has had a recent decline in their functional status and demonstrates the ability to make significant improvements in function in a reasonable and predictable amount of time.  ?Equipment Recommendations ? None recommended by OT;Other (comment) (per next venue of care)  ?  ?Recommendations for Other Services   ? ? ?  ?Precautions / Restrictions Precautions ?Precautions: Fall ?Restrictions ?Weight Bearing Restrictions: Yes ?LLE Weight Bearing: Weight bearing as tolerated  ? ?  ? ?Mobility Bed Mobility ?Overal bed mobility: Needs Assistance ?Bed Mobility: Supine to Sit ?  ?  ?Supine to sit: Mod assist, HOB elevated ?  ?  ?  ?  ? ?Transfers ?Overall transfer level: Needs assistance ?Equipment used: Rolling walker (2 wheels) ?Transfers: Bed to chair/wheelchair/BSC, Sit to/from Stand ?Sit to Stand: Max assist ?Stand pivot transfers: Max assist, +2 physical assistance ?  ?  ?  ?  ?  ?  ? ?  ?Balance Overall balance assessment: Needs assistance ?Sitting-balance support: Feet supported, Bilateral upper extremity supported ?Sitting balance-Leahy Scale: Zero ?  ?Postural control: Right lateral lean ?Standing balance support: During functional activity, Bilateral upper extremity supported ?Standing balance-Leahy Scale: Zero ?  ?  ?  ?  ?  ?  ?  ?  ?  ?  ?  ?  ?   ? ?ADL either performed or assessed with clinical judgement  ? ?ADL Overall ADL's : Needs assistance/impaired ?Eating/Feeding: Sitting;Set up ?Eating/Feeding Details (indicate cue type and reason): in chair ?Grooming: Wash/dry face;Sitting;Set up ?Grooming Details (indicate cue type and reason): in chair ?Upper Body Bathing: Minimal assistance;Sitting ?Upper Body Bathing Details (indicate cue type and reason): in chair ?  Lower Body Bathing: Maximal assistance;Total assistance;Sit to/from stand ?  ?Upper Body Dressing : Sitting;Moderate assistance ?Upper Body Dressing Details (indicate cue type and reason): at edge of bed ?  ?   ?Toilet Transfer: Maximal assistance;Moderate assistance;BSC/3in1 ?Toilet Transfer Details (indicate cue type and reason): SPT ?Toileting- Clothing Manipulation and Hygiene: Total assistance;Sit to/from stand;Maximal assistance ?Toileting - Clothing Manipulation Details (indicate cue type and reason): STS with MOD-MAX A +2, total A for peri care due to BM ?  ?  ?Functional mobility during ADLs: Moderate assistance;Maximal assistance;+2 for physical assistance;+2 for safety/equipment;Rolling walker (2 wheels) ?   ? ? ? ?Vision Patient Visual Report: No change from baseline ?Additional Comments: pt reports no change from baseline  ?   ?Perception   ?  ?Praxis   ?  ? ?Pertinent Vitals/Pain Pain Assessment ?Pain Assessment: Faces ?Faces Pain Scale: Hurts a little bit ?Pain Location: L hip ?Pain Descriptors / Indicators: Discomfort, Sharp ?Pain Intervention(s): Monitored during session, Repositioned  ? ? ? ?Hand Dominance   ?  ?Extremity/Trunk Assessment Upper Extremity Assessment ?Upper Extremity Assessment: Generalized weakness ?  ?Lower Extremity Assessment ?Lower Extremity Assessment: Generalized weakness ?  ?Cervical / Trunk Assessment ?Cervical / Trunk Assessment: Kyphotic ?  ?Communication Communication ?Communication: HOH ?  ?Cognition Arousal/Alertness: Awake/alert ?Behavior During Therapy: [facility] for tasks assessed/performed ?Overall Cognitive Status: No family/caregiver present to determine baseline cognitive functioning ?  ?  ?  ?  ?  ?  ?  ?  ?  ?  ?  ?  ?  ?  ?  ?  ?General Comments: pt oriented to self, time, state however not oriented to situation or that he is in the hospital. Pt with carry over following re-orientation stating "I am here because I had a probelm with my heart". Good one step direction following. Fair awareness of deficits ?  ?  ?General Comments  Pt with incontinent BM in bed & continent BM on BSC. ? ?  ?Exercises   ?  ?Shoulder Instructions    ? ? ?Home Living Family/patient expects to be  discharged to:: Private residence ?Living Arrangements: Alone;Other (Comment) (pt reports he lives alone in [place] but has been staying with neice here in [place]) ?  ?  ?  ?  ?  ?  ?  ?  ?  ?  ?  ?  ?  ?  ?  ?Additional Comments: Pt reports [place] from [place] stayign with his neice in which he can live on the main floor. Pt had a fall and THA in Feb 2023, discharged to rehab then back to his neices where he stayed PTA ?  ? ?  ?Prior Functioning/Environment   ?  ?  ?  ?  ?  ?  ?Mobility Comments: Pt was amb with RW, prn use of mwc due to decline ?ADLs Comments: Assist for bathing and LB dressing from neice per pt report; Neice assisted with IADLs following discharge from rehab ?  ? ?  ?  ?OT Problem List: Decreased strength;Decreased activity tolerance;Decreased knowledge of use of DME or AE;Impaired balance (sitting and/or standing);Decreased cognition ?  ?   ?OT Treatment/Interventions: Self-care/ADL training;Therapeutic exercise;Patient/family education;Modalities;Splinting;Balance training;Therapeutic activities;Energy conservation;DME and/or AE instruction;Cognitive remediation/compensation  ?  ?OT Goals(Current goals can be found in the care plan section) Acute Rehab OT Goals ?Patient Stated Goal: get stronger ?OT Goal Formulation: With patient ?Time For Goal Achievement: 04/26/21 ?Potential to Achieve Goals: Good ?ADL Goals ?Pt Will Perform Grooming: (P)  standing;with min assist;sitting ?Pt Will Perform Upper Body Dressing: (P) sitting;with supervision ?Pt Will Perform Lower Body Dressing: (P) with min assist;with adaptive equipment;sit to/from stand ?Pt Will Transfer to Toilet: (P) with supervision;ambulating  ?OT Frequency: Min 2X/week ?  ? ?Co-evaluation PT/OT/SLP Co-Evaluation/Treatment: Yes ?Reason for Co-Treatment: For patient/therapist safety;To address functional/ADL transfers ?PT goals addressed during session: Mobility/safety with mobility;Balance;Proper use of DME ?OT goals addressed during  session: ADL's and self-care;Proper use of Adaptive equipment and DME ?  ? ?  ?AM-PAC OT "6 Clicks" Daily Activity     ?Outcome Measure Help from another person eating meals?: None ?Help from another person ta

## 2021-04-12 NOTE — Progress Notes (Signed)
Patient has had 2 other urinary occurrences. ? ?Output measured this shift:275 ml from canister ? ?

## 2021-04-12 NOTE — Progress Notes (Signed)
?  Progress Note ? ? ?Patient: Jamie Brennan EUM:[id number] DOB: October 31, 1931 DOA: 04/10/2021     2 ?DOS: the patient was seen and examined on 04/12/2021 ?  ?Brief hospital course: ?No notes on file ? ?Assessment and Plan: ?* Acute ST elevation myocardial infarction (STEMI) ([place]) ?Late presenting. ?Patient presents for evaluation of weakness and is found to have trop >24,000. ?Per his niece he had chest pain 2 days prior to presentation. ?Twelve-lead EKG shows ST elevations in V1 and V2 but also has Q waves consistent with subacute MI ?Plan: ?--complete 48 hours of heparin gtt today ?--defer heart cath, per cardio ?--cont ASA and plavix ? ?Dehydration with hyponatremia ?Most likely related to poor oral intake ?--improved with gentle IV hydration ? ?Acute systolic CHF (congestive heart failure) ([place]) ?--Echo showed LVEF 30 to 35%, anterior apical dyskinesis.  Possibly acute due to STEMI. ?Plan: ?--cont Toprol ?--hold off adding ACE due to soft BP ? ? ?Stage 3b chronic kidney disease (CKD) ([place]) ?--Cr at baseline around 1.7 ? ?AMS (altered mental status) ?--improved. ? ?Closed left hip fracture ([place]) ?Pain control ?Resume physical therapy when able ?Fall precautions ? ? ? ? ?  ? ?Subjective:  ?Denied chest pain.  Normal oral intake.   ? ?PT/OT rec SNF rehab. ? ? ?Physical Exam: ? ?Constitutional: NAD, AAOx3 ?HEENT: conjunctivae and lids normal, EOMI ?CV: No cyanosis.   ?RESP: normal respiratory effort, on RA ?Extremities: No effusions, edema in BLE ?SKIN: warm, dry ?Neuro: II - XII grossly intact.   ?Psych: Normal mood and affect.  Appropriate judgement and reason ? ? ? ?Data Reviewed: ? ?Family Communication:  ? ?Disposition: ?Status is: Inpatient ? ? Planned Discharge Destination: Rehab ? ? ? ?Time spent: 35 minutes ? ?Author: ?Enzo Bi, MD ?04/12/2021 4:12 PM ? ?For on call review [email or web address].  ?

## 2021-04-12 NOTE — Consult Note (Signed)
ANTICOAGULATION CONSULT NOTE  ? ?Pharmacy Consult for Heparin ?Indication: chest pain/ACS ? ?No Known Allergies ? ?Patient Measurements: ?Height: '5\' 9"'$  (175.3 cm) ?Weight: 58.6 kg (129 lb 3 oz) ?IBW/kg (Calculated) : 70.7 ?Heparin Dosing Weight: 52 kg ? ?Vital Signs: ?Temp: 97.7 ?F (36.5 ?C) (04/02 1021) ?Temp Source: Oral (04/02 0323) ?BP: 95/74 (04/02 1173) ?Pulse Rate: 90 (04/02 0828) ? ?Labs: ?Recent Labs  ?  04/10/21 ?0720 04/10/21 ?1923 04/11/21 ?5670 04/11/21 ?1445 04/11/21 ?2252 04/12/21 ?0500 04/12/21 ?0756  ?HGB 11.4*  --  10.4*  --   --  9.5*  --   ?HCT 35.1*  --  32.3*  --   --  28.6*  --   ?PLT 453*  --  385  --   --  368  --   ?APTT 34  --   --   --   --   --   --   ?LABPROT 14.3  --   --   --   --   --   --   ?INR 1.1  --   --   --   --   --   --   ?HEPARINUNFRC  --    < > <0.10* 0.12* 0.25*  --  0.55  ?CREATININE 1.76*  --  1.74*  --   --  1.68*  --   ?CKTOTAL 355  --   --   --   --   --   --   ?TROPONINIHS >24,000*  --   --   --   --   --   --   ? < > = values in this interval not displayed.  ? ? ? ?Estimated Creatinine Clearance: 25.7 mL/min (A) (by C-G formula based on SCr of 1.68 mg/dL (H)). ? ? ?Medical History: ?Past Medical History:  ?Diagnosis Date  ? Prostate cancer ([place])   ? ? ?Medications:  ?No history of chronic anticoagulation use PTA ? ?Assessment: ?Pharmacy has been consulted to initiate heparin in 88yo patient presenting to the ED with chest pain. EKG show ST elevations in V1 and V2 but also has Q waves consistent with subacute MI. Troponin level>24,000. Cardiology team has been consulted.  ? ?4/2 0756 HL 0.55.  ? ?Goal of Therapy:  ?Heparin level 0.3-0.7 units/ml ?Monitor platelets by anticoagulation protocol: Yes ?  ?Plan:  ?Heparin level is therapeutic. Will continue heparin at 1350 units/hr. Plan to continue heparin for 48 hours.  ? ?Oswald Hillock, PharmD, BCPS ?04/12/2021,8:53 AM ? ? ?

## 2021-04-12 NOTE — Progress Notes (Signed)
[facility] ? ?SUBJECTIVE: Patient laying in bed, denies chest pain or shortness of breath ? ? ?Vitals:  ? 04/11/21 1959 04/11/21 2339 04/12/21 4097 04/12/21 3532  ?BP: 94/70 92/68 102/75 95/74  ?Pulse: 99 99 92 90  ?Resp: '17 18 17 18  '$ ?Temp: 97.8 ?F (36.6 ?C) 98.2 ?F (36.8 ?C) 97.7 ?F (36.5 ?C) 97.7 ?F (36.5 ?C)  ?TempSrc: Oral Oral Oral   ?SpO2: 99% 98% 98% 100%  ?Weight:      ?Height:      ? ? ? ?Intake/Output Summary (Last 24 hours) at 04/12/2021 0928 ?Last data filed at 04/12/2021 0500 ?Gross per 24 hour  ?Intake 240 ml  ?Output 275 ml  ?Net -35 ml  ? ? ? ? ?PHYSICAL EXAM ? ?General: Well developed, well nourished, in no acute distress ?HEENT:  Normocephalic and atramatic ?Neck:  No JVD.  ?Lungs: Clear bilaterally to auscultation and percussion. ?Heart: HRRR . Normal S1 and S2 without gallops or murmurs.  ?Abdomen: Bowel sounds are positive, abdomen soft and non-tender  ?Msk:  Back normal, normal gait. Normal strength and tone for age. ?Extremities: No clubbing, cyanosis or edema.   ?Neuro: Alert and oriented X 3. ?Psych:  Good affect, responds appropriately ? ? ?LABS: ?Basic Metabolic Panel: ?Recent Labs  ?  04/11/21 ?9924 04/12/21 ?0500  ?NA 132* 132*  ?K 4.2 3.9  ?CL 98 99  ?CO2 23 25  ?GLUCOSE 118* 125*  ?BUN 40* 39*  ?CREATININE 1.74* 1.68*  ?CALCIUM 8.3* 8.1*  ?MG  --  2.2  ? ?Liver Function Tests: ?No results for input(s): AST, ALT, ALKPHOS, BILITOT, PROT, ALBUMIN in the last 72 hours. ?No results for input(s): LIPASE, AMYLASE in the last 72 hours. ?CBC: ?Recent Labs  ?  04/10/21 ?0720 04/11/21 ?2683 04/12/21 ?0500  ?WBC 18.4* 12.9* 12.2*  ?NEUTROABS 15.5*  --   --   ?HGB 11.4* 10.4* 9.5*  ?HCT 35.1* 32.3* 28.6*  ?MCV 90.5 92.6 89.9  ?PLT 453* 385 368  ? ?Cardiac Enzymes: ?Recent Labs  ?  04/10/21 ?0720  ?CKTOTAL 355  ? ?BNP: ?Invalid input(s): POCBNP ?D-Dimer: ?Recent Labs  ?  04/10/21 ?0720  ?DDIMER 2.09*  ? ?Hemoglobin A1C: ?No results for input(s): HGBA1C in the last 72 hours. ?Fasting Lipid  Panel: ?Recent Labs  ?  04/11/21 ?4196  ?CHOL 136  ?HDL 45  ?[place] 76  ?TRIG 77  ?CHOLHDL 3.0  ? ?Thyroid Function Tests: ?No results for input(s): TSH, T4TOTAL, T3FREE, THYROIDAB in the last 72 hours. ? ?Invalid input(s): FREET3 ?Anemia Panel: ?No results for input(s): VITAMINB12, FOLATE, FERRITIN, TIBC, IRON, RETICCTPCT in the last 72 hours. ? ?No results found. ? ? ?Echo LVEF 30-35% with anterior and apical dyskinesis 04/10/2021 ? ?TELEMETRY: Sinus rhythm 91 bpm: ? ?ASSESSMENT AND PLAN: ? ?Principal Problem: ?  Acute ST elevation myocardial infarction (STEMI) ([place]) ?Active Problems: ?  Closed left hip fracture ([place]) ?  Dehydration with hyponatremia ?  AMS (altered mental status) ?  Stage 3b chronic kidney disease (CKD) ([place]) ?  Acute systolic CHF (congestive heart failure) ([place]) ?  ? ?1. Anteroseptal STEMI, late presentation, without chest pain or shortness of breath, on heparin drip x48 hours, cardiac catheterization and primary PCI were deferred, no post MI complication or sequelae observed ?2.  Ischemic cardiomyopathy, LVEF 30 to 35%, anterior apical dyskinesis ?3.  AKI on CKD stage III ?4.  Status post left closed hip fracture repair ?  ?Recommendations ?  ?1.  Continue medical therapy ?2.  Defer cardiac  catheterization ?3.  DC heparin ?3.  Continue aspirin and clopidogrel ?4.  Continue metoprolol succinate ?5.  Low-dose ACE when blood pressure permits ?6.  Likely discharge later today or in a.m. ? ? ?Isaias Cowman, MD, PhD, [facility] ?04/12/2021 ?9:28 AM ? ? ? ?  ?

## 2021-04-12 NOTE — Evaluation (Signed)
Physical Therapy Evaluation ?Patient Details ?Name: Jamie Brennan ?MRN: [id number] ?DOB: 05/28/1931 ?Today's Date: 04/12/2021 ? ?History of Present Illness ? Pt is an 88 y/o M admitted on 04/10/21 after presenting to the ER for evaluation of waekness & confusion. Pt also c/o chest pain for 2 days prior to arriving to ED. Pt found to have STEMI, late presentation. PMH: prostate CA, s/p recent L THA  ?Clinical Impression ? Pt seen for PT evaluation with co-tx with OT for pt & therapists's safety. Pt is oriented to self & year, with therapist educating him on situation & location. Pt requires mod assist for supine>sit & max assist +2 for STS, stand pivot, & gait attempts with RW. Pt demonstrates R lateral lean in sitting & standing with poor ability to correct with pt reporting increased pain when weight shifting to LLE. Pt is able to ambulate 5 ft with RW & +2 assist with chair follow for safety. Pt would benefit from STR upon d/c to maximize independence with functional mobility, reduce fall risk, & decrease caregiver burden prior to return home. ?   ?   ? ?Recommendations for follow up therapy are one component of a multi-disciplinary discharge planning process, led by the attending physician.  Recommendations may be updated based on patient status, additional functional criteria and insurance authorization. ? ?Follow Up Recommendations Skilled nursing-short term rehab (<3 hours/day) ? ?  ?Assistance Recommended at Discharge Frequent or constant Supervision/Assistance  ?Patient can return home with the following ? Two people to help with walking and/or transfers;Two people to help with bathing/dressing/bathroom;Direct supervision/assist for medications management;Help with stairs or ramp for entrance;Assist for transportation;Direct supervision/assist for financial management;Assistance with cooking/housework ? ?  ?Equipment Recommendations Rolling walker (2 wheels)  ?Recommendations for Other Services ?    ?   ?Functional Status Assessment Patient has had a recent decline in their functional status and demonstrates the ability to make significant improvements in function in a reasonable and predictable amount of time.  ? ?  ?Precautions / Restrictions Precautions ?Precautions: Fall ?Restrictions ?Weight Bearing Restrictions: Yes ?LLE Weight Bearing: Weight bearing as tolerated  ? ?  ? ?Mobility ? Bed Mobility ?Overal bed mobility: Needs Assistance ?Bed Mobility: Supine to Sit ?  ?  ?Supine to sit: Mod assist, HOB elevated ?  ?  ?General bed mobility comments: assistance to scoot to EOB & upright trunk with HOB elevated, bed rails ?  ? ?Transfers ?Overall transfer level: Needs assistance ?Equipment used: Rolling walker (2 wheels) ?Transfers: Bed to chair/wheelchair/BSC, Sit to/from Stand ?Sit to Stand: Max assist ?Stand pivot transfers: Max assist, +2 physical assistance ?  ?  ?  ?  ?General transfer comment: pt requires max cuing for safe hand placement for STS transfers, unable to weight shift & advance feet to step to [facility] & requires max assist +2 for safety with RW ?  ? ?Ambulation/Gait ?Ambulation/Gait assistance: Max assist, +2 physical assistance ?Gait Distance (Feet): 5 Feet ?Assistive device: Rolling walker (2 wheels) ?Gait Pattern/deviations: Decreased step length - left, Decreased step length - right, Decreased dorsiflexion - right, Decreased dorsiflexion - left, Decreased stride length, Decreased weight shift to left ?Gait velocity: decreased ?  ?  ?General Gait Details: R lateral lean as pt resistant to weight shift L 2/2 LLE pain per pt report ? ?Stairs ?  ?  ?  ?  ?  ? ?Wheelchair Mobility ?  ? ?Modified Rankin (Stroke Patients Only) ?  ? ?  ? ?Balance Overall balance assessment: Needs  assistance ?Sitting-balance support: Feet supported, Bilateral upper extremity supported ?Sitting balance-Leahy Scale: Zero ?Sitting balance - Comments: poor awareness & poor righting reactions ?Postural control: Right lateral  lean ?Standing balance support: During functional activity, Bilateral upper extremity supported ?Standing balance-Leahy Scale: Zero ?Standing balance comment: R lateral lean in standing despite multiple attempts from PT to cue pt for improved midline orientation ?  ?  ?  ?  ?  ?  ?  ?  ?  ?  ?  ?   ? ? ? ?Pertinent Vitals/Pain Pain Assessment ?Pain Assessment: Faces ?Faces Pain Scale:  (pt avoids shifting weight to L during mobility) ?Pain Location: L hip/knee ?Pain Descriptors / Indicators: Discomfort, Sharp ?Pain Intervention(s): Monitored during session  ? ? ?Home Living   ?  ?  ?  ?  ?  ?  ?  ?  ?  ?Additional Comments: [place] and is staying here with his niece [name]) where he can stay on the main floor with bedroom/bathroom. Pt fell & had THA in Feb. 2023, d/c to rehab & then returned home with neice where he still was prior to this admission.  ?  ?Prior Function   ?  ?  ?  ?  ?  ?  ?Mobility Comments: Pt was ambulating with RW & receiving HHPT services, per chart pt had started using w/c 2/2 decline due to chest pain & weakness prior to admission. ?  ?  ? ? ?Hand Dominance  ?   ? ?  ?Extremity/Trunk Assessment  ? Upper Extremity Assessment ?Upper Extremity Assessment: Generalized weakness ?  ? ?Lower Extremity Assessment ?Lower Extremity Assessment: Generalized weakness (Pt with 3-/5 L knee extension in sitting, PT provides cuing for isolated knee extension as pt attempts to flex hip to compensate for ROM, notes L knee stiffness but full knee extension PROM. Pt avoids weight shifting/bearing through LLE 2/2 pain) ?  ? ?Cervical / Trunk Assessment ?Cervical / Trunk Assessment: Kyphotic  ?Communication  ? Communication: HOH  ?Cognition Arousal/Alertness: Awake/alert ?Behavior During Therapy: [facility] for tasks assessed/performed ?Overall Cognitive Status: No family/caregiver present to determine baseline cognitive functioning ?  ?  ?  ?  ?  ?  ?  ?  ?  ?  ?  ?  ?  ?  ?  ?   ?General Comments: Pt oriented to self, time, state, recalls he broke his hip but thinks that's why he's still in the hospital, doesn't recall STEMI. ?  ?  ? ?  ?General Comments General comments (skin integrity, edema, etc.): Pt with incontinent BM in bed & continent BM on BSC. ? ?  ?Exercises General Exercises - Lower Extremity ?Long Arc Quad: AROM, Strengthening, Left, 20 reps, Seated  ? ?Assessment/Plan  ?  ?PT Assessment Patient needs continued PT services  ?PT Problem List Decreased strength;Decreased mobility;Decreased safety awareness;Decreased range of motion;Decreased coordination;Decreased knowledge of precautions;Cardiopulmonary status limiting activity;Decreased cognition;Decreased activity tolerance;Decreased balance;Decreased knowledge of use of DME;Impaired sensation;Pain ? ?   ?  ?PT Treatment Interventions DME instruction;Therapeutic exercise;Gait training;Balance training;Neuromuscular re-education;Manual techniques;Modalities;Therapeutic activities;Patient/family education;Functional mobility training;Cognitive remediation   ? ?PT Goals (Current goals can be found in the Care Plan section)  ?Acute Rehab PT Goals ?Patient Stated Goal: get better ?PT Goal Formulation: With patient ?Time For Goal Achievement: 04/26/21 ?Potential to Achieve Goals: Fair ? ?  ?Frequency Min 2X/week ?  ? ? ?Co-evaluation PT/OT/SLP Co-Evaluation/Treatment: Yes ?Reason for Co-Treatment: For patient/therapist safety;To address functional/ADL transfers ?  PT goals addressed during session: Mobility/safety with mobility;Balance;Proper use of DME ?  ?  ? ? ?  ?AM-PAC PT "6 Clicks" Mobility  ?Outcome Measure Help needed turning from your back to your side while in a flat bed without using bedrails?: A Lot ?Help needed moving from lying on your back to sitting on the side of a flat bed without using bedrails?: A Lot ?Help needed moving to and from a bed to a chair (including a wheelchair)?: Total ?Help needed standing up from a  chair using your arms (e.g., wheelchair or bedside chair)?: Total ?Help needed to walk in hospital room?: Total ?Help needed climbing 3-5 steps with a railing? : Total ?6 Click Score: 8 ? ?  ?End of Session

## 2021-04-13 DIAGNOSIS — I213 ST elevation (STEMI) myocardial infarction of unspecified site: Secondary | ICD-10-CM | POA: Diagnosis not present

## 2021-04-13 LAB — CBC
HCT: 30.7 % — ABNORMAL LOW (ref 39.0–52.0)
Hemoglobin: 9.9 g/dL — ABNORMAL LOW (ref 13.0–17.0)
MCH: 29.5 pg (ref 26.0–34.0)
MCHC: 32.2 g/dL (ref 30.0–36.0)
MCV: 91.4 fL (ref 80.0–100.0)
Platelets: 373 10*3/uL (ref 150–400)
RBC: 3.36 MIL/uL — ABNORMAL LOW (ref 4.22–5.81)
RDW: 14.5 % (ref 11.5–15.5)
WBC: 10.2 10*3/uL (ref 4.0–10.5)
nRBC: 0 % (ref 0.0–0.2)

## 2021-04-13 LAB — BASIC METABOLIC PANEL
Anion gap: 10 (ref 5–15)
BUN: 44 mg/dL — ABNORMAL HIGH (ref 8–23)
CO2: 25 mmol/L (ref 22–32)
Calcium: 8.2 mg/dL — ABNORMAL LOW (ref 8.9–10.3)
Chloride: 100 mmol/L (ref 98–111)
Creatinine, Ser: 1.66 mg/dL — ABNORMAL HIGH (ref 0.61–1.24)
GFR, Estimated: 40 mL/min — ABNORMAL LOW (ref >60)
Glucose, Bld: 122 mg/dL — ABNORMAL HIGH (ref 70–99)
Potassium: 3.9 mmol/L (ref 3.5–5.1)
Sodium: 135 mmol/L (ref 135–145)

## 2021-04-13 LAB — MAGNESIUM: Magnesium: 2.2 mg/dL (ref 1.7–2.4)

## 2021-04-13 NOTE — Care Management Important Message (Signed)
Important Message ? ?Patient Details  ?Name: Jamie Brennan ?MRN: [id number] ?Date of Birth: Dec 04, 1931 ? ? ?Medicare Important Message Given:  Yes ? ? ? ? ?[name] ?04/13/2021, 11:52 AM ?

## 2021-04-13 NOTE — TOC Initial Note (Signed)
Transition of Care (TOC) - Initial/Assessment Note  ? ? ?Patient Details  ?Name: Jamie Brennan ?MRN: [id number] ?Date of Birth: 1931-02-26 ? ?Transition of Care (TOC) CM/SW Contact:    ?Alberteen Sam, LCSW ?Phone Number: ?04/13/2021, 1:56 PM ? ?Clinical Narrative:                 ? ?CSW spoke with patient's niece Jamie Brennan who is in agreement with patient going to SNF, does not want to return to [facility]. Patient was at [facility] for around 2 weeks.  ? ?Agreeable for CSW to send our referrals for bed offers.  ? ?Pending bed offers a this time.  ? ?Expected Discharge Plan: [place] ?Barriers to Discharge: Continued Medical Work up ? ? ?Patient Goals and CMS Choice ?Patient states their goals for this hospitalization and ongoing recovery are:: to go home ?CMS Medicare.gov Compare Post Acute Care list provided to:: Patient Represenative (must comment) (niece Jamie Brennan) ?  ? ?Expected Discharge Plan and Services ?Expected Discharge Plan: [place] ?  ?  ?  ?Living arrangements for the past 2 months: [place] ?                ?  ?  ?  ?  ?  ?  ?  ?  ?  ?  ? ?Prior Living Arrangements/Services ?Living arrangements for the past 2 months: [place] ?Lives with:: Relatives ?  ?       ?  ?  ?  ?  ? ?Activities of Daily Living ?  ?  ? ?Permission Sought/Granted ?  ?  ?   ?   ?   ?   ? ?Emotional Assessment ?  ?  ?  ?  ?  ?  ? ?Admission diagnosis:  Acute ST elevation myocardial infarction (STEMI) ([place]) [I21.3] ?Acute ST elevation myocardial infarction (STEMI) of anteroseptal wall ([place]) [I21.09] ?Patient Active Problem List  ? Diagnosis Date Noted  ? Stage 3b chronic kidney disease (CKD) ([place]) 04/11/2021  ? Acute systolic CHF (congestive heart failure) ([place]) 04/11/2021  ? Acute ST elevation myocardial infarction (STEMI) ([place]) 04/10/2021  ? Dehydration with hyponatremia 04/10/2021  ? AMS (altered mental status) 04/10/2021  ? Acute postoperative anemia due to expected blood loss  02/25/2021  ? Stage 3a chronic kidney disease (CKD) ([place]) 02/25/2021  ? Vitamin B12 deficiency 02/25/2021  ? Closed left hip fracture ([place]) 02/20/2021  ? ?PCP:  Adin Hector, MD ?Pharmacy:   ?[id number] [address] [address]?Phone: [phone]: [phone]? ?[facility] [place]?Phone: [phone]: [phone]? ? ? ? ?Social Determinants of Health (SDOH) Interventions ?  ? ?Readmission Risk Interventions ?   ? View : No data to display.  ?  ?  ?  ? ? ? ?

## 2021-04-13 NOTE — Progress Notes (Signed)
Physical Therapy Treatment ?Patient Details ?Name: Jamie Brennan ?MRN: [id number] ?DOB: 1931/07/12 ?Today's Date: 04/13/2021 ? ? ?History of Present Illness Pt is an 88 y/o M admitted on 04/10/21 after presenting to the ER for evaluation of waekness & confusion. Pt also c/o chest pain for 2 days prior to arriving to ED. Pt found to have STEMI, late presentation. PMH: prostate CA, s/p recent L THA ? ?  ?PT Comments  ? ? PT/OT co-treatment performed.  Pt resting in bed upon therapy arrival (NT present and reports just finishing washing pt up).  Pt pleasant and agreeable to PT/OT session; oriented to person and  (pt reporting it was April 2003 and was here d/t hip fx).  Mod to max assist semi-supine to sitting edge of bed; close SBA to min assist for sitting balance; min to mod assist x2 to stand up to RW 1st 2 trials standing; mod to max assist x2 to stand up to RW (about 3/4th stand) 3rd-5th trials; max assist x2 to attempt taking a couple steps towards R (bed to recliner) with RW but d/t R lean and safety concerns pt assisted back to sitting on edge of bed; and min assist x2 to lay down in bed end of session.  Although pt with recent L THA, pt noted to not be putting weight through R LE with standing activities causing R lean (which worsened with fatigue)--pt reporting no pain R LE and strength appearing strong (and symmetrical) B LE's with MMT of hip flexion, knee flexion/extension, and DF/PF--nurse notified of noted concerns.  Will continue to focus on strengthening, balance, and progressive functional mobility during hospitalization. ?  ?Recommendations for follow up therapy are one component of a multi-disciplinary discharge planning process, led by the attending physician.  Recommendations may be updated based on patient status, additional functional criteria and insurance authorization. ? ?Follow Up Recommendations ? Skilled nursing-short term rehab (<3 hours/day) ?  ?  ?Assistance Recommended at Discharge  Frequent or constant Supervision/Assistance  ?Patient can return home with the following Two people to help with walking and/or transfers;Two people to help with bathing/dressing/bathroom;Direct supervision/assist for medications management;Help with stairs or ramp for entrance;Assist for transportation;Direct supervision/assist for financial management;Assistance with cooking/housework ?  ?Equipment Recommendations ? Rolling walker (2 wheels)  ?  ?Recommendations for Other Services   ? ? ?  ?Precautions / Restrictions Precautions ?Precautions: Fall;Posterior Hip ?Restrictions ?Weight Bearing Restrictions: Yes ?LLE Weight Bearing: Weight bearing as tolerated  ?  ? ?Mobility ? Bed Mobility ?Overal bed mobility: Needs Assistance ?Bed Mobility: Supine to Sit, Sit to Supine ?  ?  ?Supine to sit: Mod assist, Max assist, HOB elevated ?Sit to supine: Min assist, +2 for physical assistance, HOB elevated ?  ?General bed mobility comments: assist for trunk and B LE's; vc's for technique; 2 assist to boost up in bed (pt able to finish pulling himself up/repositioning in bed using bed rails) ?  ? ?Transfers ?Overall transfer level: Needs assistance ?Equipment used: Rolling walker (2 wheels) ?Transfers: Sit to/from Stand, Bed to chair/wheelchair/BSC ?Sit to Stand: Min assist, Mod assist, Max assist, +2 physical assistance, From elevated surface ?  ?  ?  ?  ?  ?General transfer comment: Min to mod assist x2 (1st 2 trials) to stand up to RW; then mod to max assist x2 to stand up to RW (3-5th trials) but unable to get to full stand (around 3/4th stand); max assist x2 to attempt transfer bed to recliner (able to take a couple  steps but deferred d/t R lean and safety concerns) ?  ? ?Ambulation/Gait ?  ?  ?  ?  ?  ?  ?  ?  ? ? ?Stairs ?  ?  ?  ?  ?  ? ? ?Wheelchair Mobility ?  ? ?Modified Rankin (Stroke Patients Only) ?  ? ? ?  ?Balance Overall balance assessment: Needs assistance ?Sitting-balance support: Feet supported, Bilateral  upper extremity supported ?Sitting balance-Leahy Scale: Poor ?Sitting balance - Comments: intermittent min assist d/t posterior and mild R lateral lean ?  ?Standing balance support: Bilateral upper extremity supported, Reliant on assistive device for balance ?Standing balance-Leahy Scale: Poor ?Standing balance comment: R lateral lean in standing (decreased WB'ing noted through R LE) ?  ?  ?  ?  ?  ?  ?  ?  ?  ?  ?  ?  ? ?  ?Cognition Arousal/Alertness: Awake/alert ?Behavior During Therapy: [facility] for tasks assessed/performed ?Overall Cognitive Status: No family/caregiver present to determine baseline cognitive functioning ?  ?  ?  ?  ?  ?  ?  ?  ?  ?  ?  ?  ?  ?  ?  ?  ?General Comments: Oriented to person, [name] (but not hospital), reported it was April 2003 (chose 2003 even when given multiple choice), and that he was here d/t hip fx. ?  ?  ? ?  ?Exercises General Exercises - Lower Extremity ?Long Arc Quad: AROM, Strengthening, Both, 10 reps, Seated (min assist for sitting balance d/t posterior lean) ? ?  ?General Comments  Nursing cleared pt for participation in physical therapy.  Pt agreeable to PT session. ?  ?  ? ?Pertinent Vitals/Pain Pain Assessment ?Pain Assessment: Faces ?Faces Pain Scale: Hurts a little bit ?Pain Location: L hip ?Pain Descriptors / Indicators: Guarding ?Pain Intervention(s): Limited activity within patient's tolerance, Monitored during session, Repositioned ?Vitals (HR and O2 on room air) stable and WFL throughout treatment session.  ? ? ?Home Living   ?  ?  ?  ?  ?  ?  ?  ?  ?  ?   ?  ?Prior Function    ?  ?  ?   ? ?PT Goals (current goals can now be found in the care plan section) Acute Rehab PT Goals ?Patient Stated Goal: get better ?PT Goal Formulation: With patient ?Time For Goal Achievement: 04/26/21 ?Potential to Achieve Goals: Fair ?Progress towards PT goals: Progressing toward goals ? ?  ?Frequency ? ? ? Min 2X/week ? ? ? ?  ?PT Plan Current plan remains appropriate   ? ? ?Co-evaluation PT/OT/SLP Co-Evaluation/Treatment: Yes ?Reason for Co-Treatment: For patient/therapist safety;To address functional/ADL transfers ?PT goals addressed during session: Mobility/safety with mobility;Balance;Strengthening/ROM ?OT goals addressed during session: ADL's and self-care ?  ? ?  ?AM-PAC PT "6 Clicks" Mobility   ?Outcome Measure ? Help needed turning from your back to your side while in a flat bed without using bedrails?: A Lot ?Help needed moving from lying on your back to sitting on the side of a flat bed without using bedrails?: A Lot ?Help needed moving to and from a bed to a chair (including a wheelchair)?: Total ?Help needed standing up from a chair using your arms (e.g., wheelchair or bedside chair)?: Total ?Help needed to walk in hospital room?: Total ?Help needed climbing 3-5 steps with a railing? : Total ?6 Click Score: 8 ? ?  ?End of Session Equipment Utilized During Treatment: Gait belt ?Activity Tolerance:  Patient limited by fatigue ?Patient left: in bed;with call bell/phone within reach;with bed alarm set;Other (comment) (B prevalon boots in place) ?Nurse Communication: Mobility status;Precautions;Other (comment) (Decreased WB'ing R LE) ?PT Visit Diagnosis: Unsteadiness on feet (R26.81);Difficulty in walking, not elsewhere classified (R26.2);Muscle weakness (generalized) (M62.81);Pain;Other abnormalities of gait and mobility (R26.89) ?Pain - Right/Left: Left ?Pain - part of body: Hip ?  ? ? ?Time: 1423-9532 ?PT Time Calculation (min) (ACUTE ONLY): 26 min ? ?Charges:  $Therapeutic Activity: 8-22 mins          ?          ?Leitha Bleak, PT ?04/13/21, 4:45 PM ? ? ? ?

## 2021-04-13 NOTE — [place] FL2 (Signed)
?[place] MEDICAID FL2 LEVEL OF CARE SCREENING TOOL  ?  ? ?IDENTIFICATION  ?Patient Name: ?Jamie Brennan Birthdate: 1931/02/11 Sex: male Admission Date (Current Location): ?04/10/2021  ?[place] and [place] Number: ? [place] ?  Facility and Address:  ?[facility], [address] [place], [place] [address] ?     Provider Number: ?[id number]  ?Attending Physician Name and Address:  ?Enzo Bi, MD ? Relative Name and Phone Number:  ?[name] (niece) [phone] ?   ?Current Level of Care: ?Hospital Recommended Level of Care: ?[place] Prior Approval Number: ?  ? ?Date Approved/Denied: ?  PASRR Number: ?[id number] A ? ?Discharge Plan: ?SNF ?  ? ?Current Diagnoses: ?Patient Active Problem List  ? Diagnosis Date Noted  ? Stage 3b chronic kidney disease (CKD) ([place]) 04/11/2021  ? Acute systolic CHF (congestive heart failure) ([place]) 04/11/2021  ? Acute ST elevation myocardial infarction (STEMI) ([place]) 04/10/2021  ? Dehydration with hyponatremia 04/10/2021  ? AMS (altered mental status) 04/10/2021  ? Acute postoperative anemia due to expected blood loss 02/25/2021  ? Stage 3a chronic kidney disease (CKD) ([place]) 02/25/2021  ? Vitamin B12 deficiency 02/25/2021  ? Closed left hip fracture ([place]) 02/20/2021  ? ? ?Orientation RESPIRATION BLADDER Height & Weight   ?  ?Self, Time, Situation, Place ? Normal Incontinent, External catheter Weight: 129 lb 3 oz (58.6 kg) ?Height:  '5\' 9"'$  (175.3 cm)  ?BEHAVIORAL SYMPTOMS/MOOD NEUROLOGICAL BOWEL NUTRITION STATUS  ?    Incontinent Diet (see discharge summary)  ?AMBULATORY STATUS COMMUNICATION OF NEEDS Skin   ?Extensive Assist Verbally Other (Comment) (left heel unstageable wound, MASD buttocks) ?  ?  ?  ?    ?     ?     ? ? ?Personal Care Assistance Level of Assistance  ?Bathing, Feeding, Total care, Dressing Bathing Assistance: Limited assistance ?Feeding assistance: Independent ?Dressing Assistance: Limited assistance ?Total Care Assistance: Maximum  assistance  ? ?Functional Limitations Info  ?Sight, Hearing, Speech Sight Info: Adequate ?Hearing Info: Impaired ?Speech Info: Adequate  ? ? ?SPECIAL CARE FACTORS FREQUENCY  ?PT (By licensed PT), OT (By licensed OT)   ?  ?PT Frequency: min 4x weekly ?OT Frequency: min 4x weekly ?  ?  ?  ?   ? ? ?Contractures Contractures Info: Not present  ? ? ?Additional Factors Info  ?Code Status, Allergies Code Status Info: DNR ?Allergies Info: no known allergies ?  ?  ?  ?   ? ?Current Medications (04/13/2021):  This is the current hospital active medication list ?Current Facility-Administered Medications  ?Medication Dose Route Frequency Provider Last Rate Last Admin  ? 0.9 %  sodium chloride infusion  250 mL Intravenous PRN Agbata, Tochukwu, MD      ? acetaminophen (TYLENOL) tablet 650 mg  650 mg Oral Q4H PRN Agbata, Tochukwu, MD   650 mg at 04/12/21 2219  ? aspirin EC tablet 81 mg  81 mg Oral Daily Agbata, Tochukwu, MD   81 mg at 04/13/21 0915  ? atorvastatin (LIPITOR) tablet 40 mg  40 mg Oral Daily Agbata, Tochukwu, MD   40 mg at 04/13/21 0915  ? clopidogrel (PLAVIX) tablet 75 mg  75 mg Oral Daily Tristan Schroeder, PA-C   75 mg at 04/13/21 0915  ? docusate sodium (COLACE) capsule 200 mg  200 mg Oral BID Agbata, Tochukwu, MD   200 mg at 04/13/21 0915  ? metoprolol succinate (TOPROL-XL) 24 hr tablet 12.5 mg  12.5 mg Oral Daily Enzo Bi, MD   12.5  mg at 04/13/21 0915  ? nitroGLYCERIN (NITROSTAT) SL tablet 0.4 mg  0.4 mg Sublingual Q5 Min x 3 PRN Agbata, Tochukwu, MD      ? ondansetron (ZOFRAN) injection 4 mg  4 mg Intravenous Q6H PRN Agbata, Tochukwu, MD      ? pantoprazole (PROTONIX) EC tablet 40 mg  40 mg Oral Daily Agbata, Tochukwu, MD   40 mg at 04/13/21 0915  ? polyethylene glycol (MIRALAX / GLYCOLAX) packet 17 g  17 g Oral Daily PRN Agbata, Tochukwu, MD      ? saccharomyces boulardii (FLORASTOR) capsule 250 mg  250 mg Oral BID Agbata, Tochukwu, MD   250 mg at 04/13/21 0914  ? sodium chloride flush (NS) 0.9 % injection 3  mL  3 mL Intravenous Q12H Agbata, Tochukwu, MD   3 mL at 04/13/21 0916  ? sodium chloride flush (NS) 0.9 % injection 3 mL  3 mL Intravenous PRN Agbata, Tochukwu, MD      ? traZODone (DESYREL) tablet 25 mg  25 mg Oral QHS PRN Agbata, Tochukwu, MD      ? vitamin B-12 (CYANOCOBALAMIN) tablet 1,000 mcg  1,000 mcg Oral Daily Agbata, Tochukwu, MD   1,000 mcg at 04/13/21 0915  ? ? ? ?Discharge Medications: ?Please see discharge summary for a list of discharge medications. ? ?Relevant Imaging Results: ? ?Relevant Lab Results: ? ? ?Additional Information ?SSN:[id number] ? ?Alberteen Sam, LCSW ? ? ? ? ?

## 2021-04-13 NOTE — ED Provider Notes (Signed)
? ?[facility] ?Provider Note ? ? ? Event Date/Time  ? First MD Initiated Contact with Patient 03/28/21 2008   ?  (approximate) ? ? ?History  ? ?Weakness ? ? ?HPI ? ?Jamie Brennan is a 88 y.o. male  with h/o CKD, hip fx who comes to ED due to gen. Weakness x 1 day. No new fall/injury. No cp/sob. He has had decreased appetite for the past few days.  ? ?  ? ? ?Physical Exam  ? ?Triage Vital Signs: ?ED Triage Vitals  ?Enc Vitals Group  ?   BP 03/28/21 1945 124/72  ?   Pulse Rate 03/28/21 1945 100  ?   Resp 03/28/21 1945 (!) 25  ?   Temp 03/28/21 1943 98.7 ?F (37.1 ?C)  ?   Temp Source 03/28/21 1943 Oral  ?   SpO2 03/28/21 1945 99 %  ?   Weight 03/28/21 1943 115 lb (52.2 kg)  ?   Height --   ?   Head Circumference --   ?   Peak Flow --   ?   Pain Score 03/28/21 1943 0  ?   Pain Loc --   ?   Pain Edu? --   ?   Excl. in [place]? --   ? ? ?Most recent vital signs: ?Vitals:  ? 03/29/21 0618 03/29/21 0730  ?BP: 121/78 120/74  ?Pulse: 77 71  ?Resp: 16 18  ?Temp: 97.7 ?F (36.5 ?C)   ?SpO2: 100% 98%  ? ? ? ?General: Awake, no distress.  ?CV:  Good peripheral perfusion. RRR ?Resp:  Normal effort. ctab ?Abd:  No distention. Soft non tender. No r/r/g. ?Other:  No LE edema. No focal neuro deficits. There are focal dry wounds, chronic appearing, on bilateral posterior heels.no drainge or soft tissue inflammatory changes. ? ? ?ED Results / Procedures / Treatments  ? ?Labs ?(all labs ordered are listed, but only abnormal results are displayed) ?Labs Reviewed  ?BASIC METABOLIC PANEL - Abnormal; Notable for the following components:  ?    Result Value  ? Glucose, Bld 201 (*)   ? BUN 28 (*)   ? Creatinine, Ser 1.71 (*)   ? Calcium 8.3 (*)   ? GFR, Estimated 38 (*)   ? All other components within normal limits  ?CBC - Abnormal; Notable for the following components:  ? WBC 14.0 (*)   ? RBC 3.57 (*)   ? Hemoglobin 10.4 (*)   ? HCT 33.6 (*)   ? All other components within normal limits  ?URINALYSIS, ROUTINE W REFLEX  MICROSCOPIC - Abnormal; Notable for the following components:  ? Color, Urine YELLOW (*)   ? APPearance CLEAR (*)   ? Hgb urine dipstick SMALL (*)   ? All other components within normal limits  ? ? ? ?EKG ? ?Interpreted by me ?Sinus tachyc, rate 104, nl axis and intervals. Nl QRS, ST segments, and T waves. ? ? ?RADIOLOGY ?Chest xray viewed and interpreted by me, unremarkable. Rad. Report reviewed. ?Xray b/l feet pending. ? ? ?PROCEDURES: ? ?Critical Care performed: No ? ?Procedures ? ? ?MEDICATIONS ORDERED IN ED: ?Medications  ?acetaminophen (TYLENOL) tablet 1,000 mg (1,000 mg Oral Given 03/28/21 2046)  ?sodium chloride 0.9 % bolus 500 mL (0 mLs Intravenous Stopped 03/28/21 2245)  ? ? ? ?IMPRESSION / MDM / ASSESSMENT AND PLAN / ED COURSE  ?I reviewed the triage vital signs and the nursing notes. ?             ?               ? ?  Differential diagnosis includes, but is not limited to, heel osteomyelitis, viral illness, UTI, pneumonia, dehydration, electrolyte abnormality, deconditioning ? ?Pt p/w generalized weakness, non focal symptoms and exam. Labs and xrays ordered. Care signed out to Dr. Charlsie Quest to f/u on workup. ? ? ?  ? ? ?FINAL CLINICAL IMPRESSION(S) / ED DIAGNOSES  ? ?Final diagnoses:  ?Generalized weakness  ?Physical deconditioning  ? ? ? ?Rx / DC Orders  ? ?ED Discharge Orders   ? ? None  ? ?  ? ? ? ?Note:  This document was prepared using Dragon voice recognition software and may include unintentional dictation errors. ?  ?Carrie Mew, MD ?04/13/21 1547 ? ?

## 2021-04-13 NOTE — Progress Notes (Signed)
?  Progress Note ? ? ?Patient: Jamie Brennan KWI:[id number] DOB: 02/11/1931 DOA: 04/10/2021     3 ?DOS: the patient was seen and examined on 04/13/2021 ?  ?Brief hospital course: ?No notes on file ? ?Assessment and Plan: ?* Acute ST elevation myocardial infarction (STEMI) ([place]) ?Late presenting. ?Patient presents for evaluation of weakness and is found to have trop >24,000. ?Per his niece he had chest pain 2 days prior to presentation. ?Twelve-lead EKG shows ST elevations in V1 and V2 but also has Q waves consistent with subacute MI ?--complete 48 hours of heparin gtt  ?--defer heart cath, per cardio ?Plan: ?--cont ASA and plavix ? ?Dehydration with hyponatremia ?Most likely related to poor oral intake ?--improved with gentle IV hydration ? ?Acute systolic CHF (congestive heart failure) ([place]) ?--Echo showed LVEF 30 to 35%, anterior apical dyskinesis.  Possibly acute due to STEMI. ?Plan: ?--cont Toprol ?--hold off adding ACE due to soft BP ? ? ?Stage 3b chronic kidney disease (CKD) ([place]) ?--Cr at baseline around 1.7 ? ?AMS (altered mental status) ?--resolved ? ?Closed left hip fracture ([place]) ?--recent IM nail on 02/21/21.  Already had rehab and went back home.   ?--PT/OT ? ? ? ? ?  ? ?Subjective:  ?Pt reported doing ok, just weak in his legs. ? ? ?Physical Exam: ? ?Constitutional: NAD, AAOx3 ?HEENT: conjunctivae and lids normal, EOMI ?CV: No cyanosis.   ?RESP: normal respiratory effort, on RA ?Extremities: No effusions, edema in BLE.  Soft boots on. ?SKIN: warm, dry ?Neuro: II - XII grossly intact.   ?Psych: Normal mood and affect.  Appropriate judgement and reason ? ? ?Data Reviewed: ? ?Family Communication: niece updated at bedside today ? ?Disposition: ?Status is: Inpatient ? ? Planned Discharge Destination: Rehab ? ? ? ?Time spent: 35 minutes ? ?Author: ?Enzo Bi, MD ?04/13/2021 3:53 PM ? ?For on call review [email or web address].  ?

## 2021-04-13 NOTE — Consult Note (Signed)
WOC Nurse Consult Note: ?Reason for Consult: bilateral heel ulcers ?Patient has been in acute rehab and pressure injuries occurred during his stay ?Wound type: ?Unstageable pressure injury: left heel: 3cm x 3cm x 0.2cm; 50% black/soft 50% pink; minimal drainage/slight odor ?Unstageable pressure injury: right heel: 1cm x 1cm x 0cm; 100% black, slightly soft; scant drainage ?Pressure Injury POA: Yes/No/NA ?Measurement:see above ?Wound bed: see above  ?Drainage (amount, consistency, odor) see above, drainage on the left heel related to  superficial necrotic skin  ?Periwound: intact  ?Dressing procedure/placement/frequency: ?Paint right heel with betadine, allow to air dry. Protect with silicone foam. ?Cover left heel with small piece of silver hydrofiber, top with silicone foam.  ? ? ?Offload bilateral heels with Prevalon boots while in bed, teach patient and family to use at home while in the bed. ? ?Discussed POC with patient and bedside nurse.  ?Re consult if needed, will not follow at this time. ?Thanks ? Payton Moder [facility] MSN, RN,CWOCN, CNS, CWON-AP [phone])  ? ? ?  ?

## 2021-04-13 NOTE — Progress Notes (Signed)
Occupational Therapy Treatment ?Patient Details ?Name: Jamie Brennan ?MRN: [id number] ?DOB: [birth year] ?Today's Date: 04/13/2021 ? ? ?History of present illness Pt is an 88 y/o M admitted on 04/10/21 after presenting to the ER for evaluation of waekness & confusion. Pt also c/o chest pain for 2 days prior to arriving to ED. Pt found to have STEMI, late presentation. PMH: prostate CA, s/p recent L THA ?  ?OT comments ? Chart reviewed to date, RN cleared pt for participation in OT tx session. Tx session targeted progressing functional mobility and ADL tolerance for improved participation/independence in ADLs. Pt required increased physical assist for STS on this date with SPT to chair unable to be attempted as pt reports fatigue and requests to return to bed. Pt is left as received, NAD, all needs met. OT will continue to follow.   ? ?Recommendations for follow up therapy are one component of a multi-disciplinary discharge planning process, led by the attending physician.  Recommendations may be updated based on patient status, additional functional criteria and insurance authorization. ?   ?Follow Up Recommendations ? Skilled nursing-short term rehab (<3 hours/day)  ?  ?Assistance Recommended at Discharge Frequent or constant Supervision/Assistance  ?Patient can return home with the following ? A lot of help with walking and/or transfers;A lot of help with bathing/dressing/bathroom;Assistance with cooking/housework;Direct supervision/assist for financial management;Assist for transportation;Help with stairs or ramp for entrance;Direct supervision/assist for medications management ?  ?Equipment Recommendations ? Other (comment) (per next venue of care)  ?  ?Recommendations for Other Services   ? ?  ?Precautions / Restrictions Precautions ?Precautions: Fall ?Restrictions ?Weight Bearing Restrictions: Yes ?LLE Weight Bearing: Weight bearing as tolerated  ? ? ?  ? ?Mobility Bed Mobility ?Overal bed mobility: Needs  Assistance ?Bed Mobility: Supine to Sit, Sit to Supine ?  ?  ?Supine to sit: Mod assist, Max assist, HOB elevated ?Sit to supine: Min assist, +2 for physical assistance, HOB elevated ?  ?  ?  ? ?Transfers ?Overall transfer level: Needs assistance ?Equipment used: Rolling walker (2 wheels) ?Transfers: Sit to/from Stand, Bed to chair/wheelchair/BSC ?Sit to Stand: Min assist, Mod assist, Max assist, +2 physical assistance, From elevated surface ?  ?  ?  ?  ?  ?General transfer comment: see PT note- pt min-mod +2 for STS on first two trials, mod-max +2 with RW final three trials, Unable to acheive full standing. Bed to chair not attempted on this date. ?  ?  ?Balance Overall balance assessment: Needs assistance ?Sitting-balance support: Feet supported, Bilateral upper extremity supported ?Sitting balance-Leahy Scale: Poor ?Sitting balance - Comments: posterior and R lateral lean, vcs to maintain ?Postural control: Posterior lean, Right lateral lean ?Standing balance support: Bilateral upper extremity supported, Reliant on assistive device for balance ?Standing balance-Leahy Scale: Poor ?  ?  ?  ?  ?  ?  ?  ?  ?  ?  ?  ?  ?   ? ?ADL either performed or assessed with clinical judgement  ? ?ADL Overall ADL's : Needs assistance/impaired ?  ?  ?  ?  ?  ?  ?  ?  ?  ?  ?  ?  ?  ?  ?  ?  ?  ?  ?  ?General ADL Comments: Grooming with MOD-MAX A at EOB ?  ? ?Extremity/Trunk Assessment   ?  ?  ?  ?  ?  ? ?Vision   ?  ?  ?Perception   ?  ?Praxis   ?  ? ?  Cognition Arousal/Alertness: Awake/alert ?Behavior During Therapy: [facility] for tasks assessed/performed ?Overall Cognitive Status: No family/caregiver present to determine baseline cognitive functioning ?  ?  ?  ?  ?  ?  ?  ?  ?  ?  ?  ?  ?  ?  ?  ?  ?General Comments: oriented to person, [place] (but not hospital), reported it was April 2003, pt reports he was here because of his hip fracture, minimal carry over after vcs ?  ?  ?   ?Exercises   ? ?  ?Shoulder Instructions   ? ? ?  ?General  Comments    ? ? ?Pertinent Vitals/ Pain       Pain Assessment ?Pain Assessment: Faces ?Faces Pain Scale: Hurts a little bit ?Pain Location: L hip ?Pain Descriptors / Indicators: Guarding ?Pain Intervention(s): Limited activity within patient's tolerance, Monitored during session, Repositioned ? ?Home Living   ?  ?  ?  ?  ?  ?  ?  ?  ?  ?  ?  ?  ?  ?  ?  ?  ?  ?  ? ?  ?Prior Functioning/Environment    ?  ?  ?  ?   ? ?Frequency ? Min 2X/week  ? ? ? ? ?  ?Progress Toward Goals ? ?OT Goals(current goals can now be found in the care plan section) ? Progress towards OT goals: Progressing toward goals ? ?Acute Rehab OT Goals ?Patient Stated Goal: get stronger ?OT Goal Formulation: With patient ?Time For Goal Achievement: 04/27/21 ?Potential to Achieve Goals: Good  ?Plan Discharge plan remains appropriate   ? ?Co-evaluation ? ? ? PT/OT/SLP Co-Evaluation/Treatment: Yes ?Reason for Co-Treatment: Complexity of the patient's impairments (multi-system involvement);To address functional/ADL transfers ?PT goals addressed during session: Mobility/safety with mobility;Balance;Strengthening/ROM ?OT goals addressed during session: ADL's and self-care;Proper use of Adaptive equipment and DME ?  ? ?  ?AM-PAC OT "6 Clicks" Daily Activity     ?Outcome Measure ? ? Help from another person eating meals?: None ?Help from another person taking care of personal grooming?: A Little ?Help from another person toileting, which includes using toliet, bedpan, or urinal?: Total ?Help from another person bathing (including washing, rinsing, drying)?: A Lot ?Help from another person to put on and taking off regular upper body clothing?: A Lot ?Help from another person to put on and taking off regular lower body clothing?: A Lot ?6 Click Score: 14 ? ?  ?End of Session Equipment Utilized During Treatment: Gait belt;Rolling walker (2 wheels) ? ?OT Visit Diagnosis: Muscle weakness (generalized) (M62.81);Unsteadiness on feet (R26.81) ?  ?Activity Tolerance  Patient tolerated treatment well ?  ?Patient Left in bed;with call bell/phone within reach;with nursing/sitter in room;with bed alarm set ?  ?Nurse Communication Mobility status ?  ? ?   ? ?Time: 5277-8242 ?OT Time Calculation (min): 23 min ? ?Charges: OT General Charges ?$OT Visit: 1 Visit ?OT Treatments ?$Self Care/Home Management : 8-22 mins ? ?Shanon Payor, OTD OTR/L  ?04/13/21, 5:17 PM  ?

## 2021-04-13 NOTE — Progress Notes (Signed)
[facility] Cardiology ? ?SUBJECTIVE: Patient laying in bed, continues to be chest pain-free, denies shortness of breath ? ? ?Vitals:  ? 04/12/21 1954 04/13/21 0000 04/13/21 0453 04/13/21 0913  ?BP: 92/81 (!) 89/70 97/70 101/75  ?Pulse: 98 93 94 95  ?Resp: '16 16 16   '$ ?Temp: 97.7 ?F (36.5 ?C) 98 ?F (36.7 ?C) (!) 97.5 ?F (36.4 ?C)   ?TempSrc:  Oral Oral   ?SpO2: 99% 97% 100% 98%  ?Weight:      ?Height:      ? ? ? ?Intake/Output Summary (Last 24 hours) at 04/13/2021 0914 ?Last data filed at 04/13/2021 0300 ?Gross per 24 hour  ?Intake 720 ml  ?Output 1400 ml  ?Net -680 ml  ? ? ? ? ?PHYSICAL EXAM ? ?General: Well developed, well nourished, in no acute distress ?HEENT:  Normocephalic and atramatic ?Neck:  No JVD.  ?Lungs: Clear bilaterally to auscultation and percussion. ?Heart: HRRR . Normal S1 and S2 without gallops or murmurs.  ?Abdomen: Bowel sounds are positive, abdomen soft and non-tender  ?Msk:  Back normal, normal gait. Normal strength and tone for age. ?Extremities: No clubbing, cyanosis or edema.   ?Neuro: Alert and oriented X 3. ?Psych:  Good affect, responds appropriately ? ? ?LABS: ?Basic Metabolic Panel: ?Recent Labs  ?  04/12/21 ?0500 04/13/21 ?7096  ?NA 132* 135  ?K 3.9 3.9  ?CL 99 100  ?CO2 25 25  ?GLUCOSE 125* 122*  ?BUN 39* 44*  ?CREATININE 1.68* 1.66*  ?CALCIUM 8.1* 8.2*  ?MG 2.2 2.2  ? ?Liver Function Tests: ?No results for input(s): AST, ALT, ALKPHOS, BILITOT, PROT, ALBUMIN in the last 72 hours. ?No results for input(s): LIPASE, AMYLASE in the last 72 hours. ?CBC: ?Recent Labs  ?  04/12/21 ?0500 04/13/21 ?2836  ?WBC 12.2* 10.2  ?HGB 9.5* 9.9*  ?HCT 28.6* 30.7*  ?MCV 89.9 91.4  ?PLT 368 373  ? ?Cardiac Enzymes: ?No results for input(s): CKTOTAL, CKMB, CKMBINDEX, TROPONINI in the last 72 hours. ?BNP: ?Invalid input(s): POCBNP ?D-Dimer: ?No results for input(s): DDIMER in the last 72 hours. ?Hemoglobin A1C: ?No results for input(s): HGBA1C in the last 72 hours. ?Fasting Lipid Panel: ?Recent Labs  ?  04/11/21 ?6294   ?CHOL 136  ?HDL 45  ?[place] 76  ?TRIG 77  ?CHOLHDL 3.0  ? ?Thyroid Function Tests: ?No results for input(s): TSH, T4TOTAL, T3FREE, THYROIDAB in the last 72 hours. ? ?Invalid input(s): FREET3 ?Anemia Panel: ?No results for input(s): VITAMINB12, FOLATE, FERRITIN, TIBC, IRON, RETICCTPCT in the last 72 hours. ? ?No results found. ? ? ?Echo LVEF 30-35% with anterior and apical dyskinesis 04/10/2021 ? ?TELEMETRY: Sinus rhythm: ? ?ASSESSMENT AND PLAN: ? ?Principal Problem: ?  Acute ST elevation myocardial infarction (STEMI) ([place]) ?Active Problems: ?  Closed left hip fracture ([place]) ?  Dehydration with hyponatremia ?  AMS (altered mental status) ?  Stage 3b chronic kidney disease (CKD) ([place]) ?  Acute systolic CHF (congestive heart failure) ([place]) ?  ? ?1. Anteroseptal STEMI, late presentation, without chest pain or shortness of breath, on heparin drip x48 hours, cardiac catheterization and primary PCI were deferred, no post MI complication or sequelae observed ?2.  Ischemic cardiomyopathy, LVEF 30 to 35%, anterior apical dyskinesis ?3.  AKI on CKD stage III ?4.  Status post left closed hip fracture repair ?  ?Recommendations ?  ?1.  Continue medical therapy ?2.  Defer cardiac catheterization ?3.  Continue aspirin and clopidogrel ?4.  Continue metoprolol succinate ?5.  Low-dose ACE when blood pressure permits ?  6.  Likely discharge today ?  ? ? ?Isaias Cowman, MD, PhD, [facility] ?04/13/2021 ?9:14 AM ? ? ? ?  ?

## 2021-04-14 DIAGNOSIS — I213 ST elevation (STEMI) myocardial infarction of unspecified site: Secondary | ICD-10-CM | POA: Diagnosis not present

## 2021-04-14 NOTE — TOC Progression Note (Signed)
Transition of Care (TOC) - Progression Note  ? ? ?Patient Details  ?Name: Jamie Brennan ?MRN: [id number] ?Date of Birth: Oct 03, 1931 ? ?Transition of Care (TOC) CM/SW Contact  ?Alberteen Sam, LCSW ?Phone Number: ?04/14/2021, 1:22 PM ? ?Clinical Narrative:    ? ?Per niece [name] they choose [facility]. Patient's insurance not in [place]. CSW has reached out to [place] with [facility] to start insurance [place].  ? ?Pending auth at this time.  ? ? ?Expected Discharge Plan: [place] ?Barriers to Discharge: Continued Medical Work up ? ?Expected Discharge Plan and Services ?Expected Discharge Plan: [place] ?  ?  ?  ?Living arrangements for the past 2 months: [place] ?                ?  ?  ?  ?  ?  ?  ?  ?  ?  ?  ? ? ?Social Determinants of Health (SDOH) Interventions ?  ? ?Readmission Risk Interventions ?   ? View : No data to display.  ?  ?  ?  ? ? ?

## 2021-04-14 NOTE — Progress Notes (Signed)
?  Progress Note ? ? ?Patient: Jamie Brennan VKP:[id number] DOB: [birth year] DOA: 04/10/2021     4 ?DOS: the patient was seen and examined on 04/14/2021 ?  ?Brief hospital course: ?Jamie Brennan is a 88 y.o. male with medical history significant for prostate cancer, recent left hip arthroplasty who presented to the ER for evaluation of weakness and confusion. ? ?Patient is status post left total hip arthroplasty, completed rehab at SNF and has been getting physical therapy at home.  According to his niece he had chest pain 2 days prior to presentation but denied having any other symptoms.  Over the last 2 days he has become increasingly weak and has been unable to ambulate with his rolling walker.  Pt was found to have STEMI on presentation. ? ?Assessment and Plan: ?* Acute ST elevation myocardial infarction (STEMI) ([place]) ?Late presenting. ?Patient presents for evaluation of weakness and is found to have trop >24,000. ?Per his niece he had chest pain 2 days prior to presentation. ?Twelve-lead EKG shows ST elevations in V1 and V2 but also has Q waves consistent with subacute MI ?--complete 48 hours of heparin gtt  ?--defer heart cath, per cardio ?Plan: ?--cont ASA and plavix ? ?Dehydration with hyponatremia ?Most likely related to poor oral intake ?--improved with gentle IV hydration ? ?Acute systolic CHF (congestive heart failure) ([place]) ?--Echo showed LVEF 30 to 35%, anterior apical dyskinesis.  Possibly acute due to STEMI. ?--appeared compensated.  ?Plan: ?--cont Toprol ?--hold off adding ACE due to soft BP ?--No need for diuresis currently ? ? ?Stage 3b chronic kidney disease (CKD) ([place]) ?--Cr at baseline around 1.7 ? ?AMS (altered mental status) ?--clinically undetermined what type and etiology.  Likely transient.  Mental status back to baseline. ? ?Closed left hip fracture ([place]) ?--recent IM nail on 02/21/21.  Already had rehab and went back home.   ?--PT/OT ? ? ? ? ?  ? ?Subjective:  ?No dyspnea.  No  swelling.  Normal oral intake, urination. ? ? ?Physical Exam: ? ?Constitutional: NAD, AAOx3, sitting in recliner ?HEENT: conjunctivae and lids normal, EOMI ?CV: No cyanosis.   ?RESP: normal respiratory effort, on RA ?Extremities: No effusions, edema in BLE ?SKIN: warm, dry ?Neuro: II - XII grossly intact.   ?Psych: Normal mood and affect.  Appropriate judgement and reason ? ? ?Data Reviewed: ? ?Family Communication:  ?Disposition: ?Status is: Inpatient ? ? Planned Discharge Destination: Rehab ? ? ? ?Time spent: 25 minutes ? ?Author: ?Enzo Bi, MD ?04/14/2021 8:17 PM ? ?For on call review [email or web address].  ?

## 2021-04-14 NOTE — Hospital Course (Signed)
Jamie Brennan is a 88 y.o. male with medical history significant for prostate cancer, recent left hip arthroplasty who presented to the ER for evaluation of weakness and confusion. ? ?Patient is status post left total hip arthroplasty, completed rehab at SNF and has been getting physical therapy at home.  According to his niece he had chest pain 2 days prior to presentation but denied having any other symptoms.  Over the last 2 days he has become increasingly weak and has been unable to ambulate with his rolling walker.  Pt was found to have STEMI on presentation. ?

## 2021-04-14 NOTE — Progress Notes (Signed)
Physical Therapy Treatment ?Patient Details ?Name: Jamie Brennan ?MRN: [id number] ?DOB: 24-Jul-1931 ?Today's Date: 04/14/2021 ? ? ?History of Present Illness Pt is an 88 y/o M admitted on 04/10/21 after presenting to the ER for evaluation of weakness & confusion. Pt also c/o chest pain for 2 days prior to arriving to ED. Pt found to have STEMI, late presentation. PMH: prostate CA, s/p recent L THA ? ?  ?PT Comments  ? ? Pt was pleasant and motivated to participate during the session and put forth good effort throughout. Pt required heavy cuing for proper sequencing with both transfers and gait as well as mod A. Pt ambulated with antalgic gait pattern on the LLE with flexed trunk and knees and was generally unable to come to full upright standing position.  Pt could only amb a max of around 5 feet before giving out and needing to come to sitting quickly.  Pt is at a very high risk of falls and would not be safe to return to his prior living situation at this time.  Pt will benefit from PT services in a SNF setting upon discharge to safely address deficits listed in patient problem list for decreased caregiver assistance and eventual return to PLOF. ? ?   ?Recommendations for follow up therapy are one component of a multi-disciplinary discharge planning process, led by the attending physician.  Recommendations may be updated based on patient status, additional functional criteria and insurance authorization. ? ?Follow Up Recommendations ? Skilled nursing-short term rehab (<3 hours/day) ?  ?  ?Assistance Recommended at Discharge Frequent or constant Supervision/Assistance  ?Patient can return home with the following Two people to help with walking and/or transfers;Two people to help with bathing/dressing/bathroom;Direct supervision/assist for medications management;Help with stairs or ramp for entrance;Assist for transportation;Direct supervision/assist for financial management;Assistance with cooking/housework ?   ?Equipment Recommendations ? Rolling walker (2 wheels)  ?  ?Recommendations for Other Services   ? ? ?  ?Precautions / Restrictions Precautions ?Precautions: Fall ?Restrictions ?Weight Bearing Restrictions: Yes ?LLE Weight Bearing: Weight bearing as tolerated  ?  ? ?Mobility ? Bed Mobility ?  ?  ?  ?  ?  ?  ?  ?General bed mobility comments: NT, pt in recliner ?  ? ?Transfers ?Overall transfer level: Needs assistance ?Equipment used: Rolling walker (2 wheels) ?Transfers: Sit to/from Stand ?Sit to Stand: Mod assist, From elevated surface ?  ?  ?  ?  ?  ?General transfer comment: Max verbal cues for increased trunk flexion and hand placement ?  ? ?Ambulation/Gait ?Ambulation/Gait assistance: Mod assist ?Gait Distance (Feet): 5 Feet x 3 ?Assistive device: Rolling walker (2 wheels) ?Gait Pattern/deviations: Step-to pattern, Trunk flexed, Antalgic, Decreased stance time - left ?Gait velocity: decreased ?  ?  ?General Gait Details: Max verbal cues for sequencing with the RW and Mod A for stability ? ? ?Stairs ?  ?  ?  ?  ?  ? ? ?Wheelchair Mobility ?  ? ?Modified Rankin (Stroke Patients Only) ?  ? ? ?  ?Balance Overall balance assessment: Needs assistance ?  ?Sitting balance-Leahy Scale: Good ?  ?  ?Standing balance support: Bilateral upper extremity supported, Reliant on assistive device for balance, During functional activity ?Standing balance-Leahy Scale: Poor ?Standing balance comment: mod A for stability during ambulation ?  ?  ?  ?  ?  ?  ?  ?  ?  ?  ?  ?  ? ?  ?Cognition Arousal/Alertness: Awake/alert ?Behavior During Therapy: [facility] for  tasks assessed/performed ?Overall Cognitive Status: No family/caregiver present to determine baseline cognitive functioning ?  ?  ?  ?  ?  ?  ?  ?  ?  ?  ?  ?  ?  ?  ?  ?  ?  ?  ?  ? ?  ?Exercises Total Joint Exercises ?Ankle Circles/Pumps: AROM, Strengthening, Both, 5 reps, 10 reps ?Quad Sets: Strengthening, Both, 5 reps, 10 reps ?Hip ABduction/ADduction: AAROM, Strengthening, Both,  5 reps ?Long Arc Quad: AROM, Strengthening, Both, 10 reps, 15 reps ?Knee Flexion: AROM, Strengthening, Both, 10 reps, 15 reps ?Other Exercises: standing mini squats x 5 ?Other Exercises: HEP education for BLE APs, QS, and LAQs ? ?  ?General Comments   ?  ?  ? ?Pertinent Vitals/Pain Pain Assessment ?Pain Assessment: 0-10 ?Pain Score: 2  ?Pain Location: L hip ?Pain Descriptors / Indicators: Guarding, Sore ?Pain Intervention(s): Repositioned, Premedicated before session, Monitored during session  ? ? ?Home Living   ?  ?  ?  ?  ?  ?  ?  ?  ?  ?   ?  ?Prior Function    ?  ?  ?   ? ?PT Goals (current goals can now be found in the care plan section) Progress towards PT goals: Progressing toward goals ? ?  ?Frequency ? ? ? Min 2X/week ? ? ? ?  ?PT Plan Current plan remains appropriate  ? ? ?Co-evaluation   ?  ?  ?  ?  ? ?  ?AM-PAC PT "6 Clicks" Mobility   ?Outcome Measure ? Help needed turning from your back to your side while in a flat bed without using bedrails?: A Lot ?Help needed moving from lying on your back to sitting on the side of a flat bed without using bedrails?: A Lot ?Help needed moving to and from a bed to a chair (including a wheelchair)?: A Lot ?Help needed standing up from a chair using your arms (e.g., wheelchair or bedside chair)?: A Lot ?Help needed to walk in hospital room?: Total ?Help needed climbing 3-5 steps with a railing? : Total ?6 Click Score: 10 ? ?  ?End of Session Equipment Utilized During Treatment: Gait belt ?Activity Tolerance: Patient limited by fatigue ?Patient left: with call bell/phone within reach;in chair;with chair alarm set ?Nurse Communication: Mobility status ?PT Visit Diagnosis: Unsteadiness on feet (R26.81);Difficulty in walking, not elsewhere classified (R26.2);Muscle weakness (generalized) (M62.81);Pain;Other abnormalities of gait and mobility (R26.89) ?Pain - Right/Left: Left ?Pain - part of body: Hip ?  ? ? ?Time: 7035-0093 ?PT Time Calculation (min) (ACUTE ONLY): 23  min ? ?Charges:  $Gait Training: 8-22 mins ?$Therapeutic Exercise: 8-22 mins          ?          ?D. Royetta Asal PT, DPT ?04/14/21, 5:33 PM ? ? ?

## 2021-04-14 NOTE — Plan of Care (Signed)
?  Problem: Education: ?Goal: Knowledge of General Education information will improve ?Description: Including pain rating scale, medication(s)/side effects and non-pharmacologic comfort measures ?Outcome: Progressing ?  ?Problem: Health Behavior/Discharge Planning: ?Goal: Ability to manage health-related needs will improve ?Outcome: Progressing ?  ?Problem: Clinical Measurements: ?Goal: Ability to maintain clinical measurements within normal limits will improve ?Outcome: Progressing ?Goal: Will remain free from infection ?Outcome: Progressing ?Goal: Diagnostic test results will improve ?Outcome: Progressing ?Goal: Respiratory complications will improve ?Outcome: Progressing ?Goal: Cardiovascular complication will be avoided ?Outcome: Progressing ?  ?Problem: Nutrition: ?Goal: Adequate nutrition will be maintained ?Outcome: Progressing ?  ?Problem: Coping: ?Goal: Level of anxiety will decrease ?Outcome: Progressing ?  ?Problem: Elimination: ?Goal: Will not experience complications related to bowel motility ?Outcome: Progressing ?Goal: Will not experience complications related to urinary retention ?Outcome: Progressing ?  ?Problem: Pain Managment: ?Goal: General experience of comfort will improve ?Outcome: Progressing ?  ?Problem: Safety: ?Goal: Ability to remain free from injury will improve ?Outcome: Progressing ?  ?Problem: Skin Integrity: ?Goal: Risk for impaired skin integrity will decrease ?Outcome: Progressing ?  ?Problem: Education: ?Goal: Ability to demonstrate management of disease process will improve ?Outcome: Progressing ?Goal: Ability to verbalize understanding of medication therapies will improve ?Outcome: Progressing ?Goal: Individualized Educational Video(s) ?Outcome: Progressing ?  ?Problem: Cardiac: ?Goal: Ability to achieve and maintain adequate cardiopulmonary perfusion will improve ?Outcome: Progressing ?  ?Problem: Activity: ?Goal: Risk for activity intolerance will decrease ?Outcome: Not  Progressing ?  ?Problem: Activity: ?Goal: Capacity to carry out activities will improve ?Outcome: Not Progressing ?  ?

## 2021-04-15 DIAGNOSIS — S72002P Fracture of unspecified part of neck of left femur, subsequent encounter for closed fracture with malunion: Secondary | ICD-10-CM

## 2021-04-15 DIAGNOSIS — N1832 Chronic kidney disease, stage 3b: Secondary | ICD-10-CM

## 2021-04-15 NOTE — TOC Progression Note (Signed)
Transition of Care (TOC) - Progression Note  ? ? ?Patient Details  ?Name: Jamie Brennan ?MRN: [id number] ?Date of Birth: 1931/10/07 ? ?Transition of Care (TOC) CM/SW Contact  ?Conception Oms, RN ?Phone Number: ?04/15/2021, 2:12 PM ? ?Clinical Narrative:   Spoke with [name] the niece reviewed the bed offers, she chose [facility], I notified [facility] and I submitted the clinical documents to [facility] to request Ins approval, Pending, Ref number [id number] ? ? ? ?Expected Discharge Plan: [place] ?Barriers to Discharge: Continued Medical Work up ? ?Expected Discharge Plan and Services ?Expected Discharge Plan: [place] ?  ?  ?  ?Living arrangements for the past 2 months: [place] ?                ?  ?  ?  ?  ?  ?  ?  ?  ?  ?  ? ? ?Social Determinants of Health (SDOH) Interventions ?  ? ?Readmission Risk Interventions ?   ? View : No data to display.  ?  ?  ?  ? ? ?

## 2021-04-15 NOTE — Plan of Care (Signed)

## 2021-04-15 NOTE — TOC Progression Note (Signed)
Transition of Care (TOC) - Progression Note  ? ? ?Patient Details  ?Name: Jamie Brennan ?MRN: [id number] ?Date of Birth: 09-Aug-1931 ? ?Transition of Care (TOC) CM/SW Contact  ?Conception Oms, RN ?Phone Number: ?04/15/2021, 11:19 AM ? ?Clinical Narrative:    ?Called and spoke with Debbie at her request, We  discussed that they do not want to go to [facility] They want to go to [facility], I explained it is under consideration by [facility] and I will let her know once they tell me if they can offer aq bed, She stated thanks  ? ? ?Expected Discharge Plan: [place] ?Barriers to Discharge: Continued Medical Work up ? ?Expected Discharge Plan and Services ?Expected Discharge Plan: [place] ?  ?  ?  ?Living arrangements for the past 2 months: [place] ?                ?  ?  ?  ?  ?  ?  ?  ?  ?  ?  ? ? ?Social Determinants of Health (SDOH) Interventions ?  ? ?Readmission Risk Interventions ?   ? View : No data to display.  ?  ?  ?  ? ? ?

## 2021-04-15 NOTE — Progress Notes (Addendum)
Occupational Therapy Treatment ?Patient Details ?Name: Jamie Brennan ?MRN: [id number] ?DOB: 1931-03-24 ?Today's Date: 04/15/2021 ? ? ?History of present illness Pt is an 88 y/o M admitted on 04/10/21 after presenting to the ER for evaluation of weakness & confusion. Pt also c/o chest pain for 2 days prior to arriving to ED. Pt found to have STEMI, late presentation. PMH: prostate CA, s/p recent L THA ?  ?OT comments ? Chart reviewed to date, RN cleared pt for participation in OT tx session. Pt with improved cognition on this date, is alert and oriented x4. Tx session targeted progressing functional mobility, increased safe completion in ADL task completion. Improved performance noted in bed mobility, STS, transfer to bedside chair requiring MIN A for all on this date. Improved independence in grooming/bathing tasks on this date as well. Pt continues to perform ADL/functional mobility below PLOF, will continue to benefit from STR to address functional deficits. Pt is left in bedside chair, NAD, all needs met. OT will continue to follow.   ? ?Recommendations for follow up therapy are one component of a multi-disciplinary discharge planning process, led by the attending physician.  Recommendations may be updated based on patient status, additional functional criteria and insurance authorization. ?   ?Follow Up Recommendations ? Skilled nursing-short term rehab (<3 hours/day)  ?  ?Assistance Recommended at Discharge Frequent or constant Supervision/Assistance  ?Patient can return home with the following ? A lot of help with walking and/or transfers;A lot of help with bathing/dressing/bathroom;Assistance with cooking/housework;Direct supervision/assist for financial management;Assist for transportation;Help with stairs or ramp for entrance;Direct supervision/assist for medications management ?  ?Equipment Recommendations ? None recommended by OT;Other (comment) (per next venue of care)  ?  ?Recommendations for Other  Services   ? ?  ?Precautions / Restrictions Precautions ?Precautions: Fall  ? ? ?  ? ?Mobility Bed Mobility ?Overal bed mobility: Needs Assistance ?Bed Mobility: Supine to Sit ?  ?  ?Supine to sit: Min assist, HOB elevated ?  ?  ?  ?  ? ?Transfers ?Overall transfer level: Needs assistance ?Equipment used: Rolling walker (2 wheels) ?Transfers: Sit to/from Stand ?Sit to Stand: Min assist ?  ?  ?  ?  ?  ?General transfer comment: approx 5 STS with MIN A with RW, vcs for body mechanics ?  ?  ?Balance Overall balance assessment: Needs assistance ?Sitting-balance support: Feet supported, Bilateral upper extremity supported ?Sitting balance-Leahy Scale: Good ?  ?  ?Standing balance support: Bilateral upper extremity supported, Reliant on assistive device for balance, During functional activity ?Standing balance-Leahy Scale: Fair ?  ?  ?  ?  ?  ?  ?  ?  ?  ?  ?  ?  ?   ? ?ADL either performed or assessed with clinical judgement  ? ?ADL Overall ADL's : Needs assistance/impaired ?Eating/Feeding: Set up;Sitting ?  ?Grooming: Wash/dry hands;Wash/dry face;Set up;Sitting ?Grooming Details (indicate cue type and reason): in bedside chair ?Upper Body Bathing: Minimal assistance;Sitting ?Upper Body Bathing Details (indicate cue type and reason): in bedside chair ?Lower Body Bathing: Sit to/from stand;Moderate assistance ?  ?Upper Body Dressing : Minimal assistance;Sitting ?  ?Lower Body Dressing: Maximal assistance ?  ?Toilet Transfer: Minimal assistance;Rolling walker (2 wheels) ?Toilet Transfer Details (indicate cue type and reason): simulated to bedside chair, able to take short, shuffling steps ?Toileting- Clothing Manipulation and Hygiene: Maximal assistance;Sit to/from stand ?  ?  ?  ?Functional mobility during ADLs: Min guard;Minimal assistance;Rolling walker (2 wheels) ?  ?  ? ?  Extremity/Trunk Assessment   ?  ?  ?  ?  ?  ? ?Vision   ?  ?  ?Perception   ?  ?Praxis   ?  ? ?Cognition Arousal/Alertness: Awake/alert ?Behavior  During Therapy: [facility] for tasks assessed/performed ?Overall Cognitive Status: Within Functional Limits for tasks assessed ?  ?  ?  ?  ?  ?  ?  ?  ?  ?  ?  ?  ?  ?  ?  ?  ?General Comments: pt is alert and oriented x4 on this date, fair-good awareness of deficits ?  ?  ?   ?Exercises   ? ?  ?Shoulder Instructions   ? ? ?  ?General Comments Pt with redness noted on sacrum, trunk, RN applied padding  ? ? ?Pertinent Vitals/ Pain       Pain Assessment ?Pain Assessment: 0-10 ?Pain Score: 4  ?Pain Location: L knee ?Pain Descriptors / Indicators: Grimacing, Sore ?Pain Intervention(s): Limited activity within patient's tolerance, Monitored during session, Repositioned ? ?Home Living   ?  ?  ?  ?  ?  ?  ?  ?  ?  ?  ?  ?  ?  ?  ?  ?  ?  ?  ? ?  ?Prior Functioning/Environment    ?  ?  ?  ?   ? ?Frequency ? Min 2X/week  ? ? ? ? ?  ?Progress Toward Goals ? ?OT Goals(current goals can now be found in the care plan section) ? Progress towards OT goals: Progressing toward goals ? ?Acute Rehab OT Goals ?Patient Stated Goal: get stronger ?OT Goal Formulation: With patient ?Time For Goal Achievement: 04/29/21 ?Potential to Achieve Goals: Good  ?Plan Discharge plan remains appropriate   ? ?Co-evaluation ? ? ?   ?  ?  ?  ?  ? ?  ?AM-PAC OT "6 Clicks" Daily Activity     ?Outcome Measure ? ? Help from another person eating meals?: None ?Help from another person taking care of personal grooming?: None ?Help from another person toileting, which includes using toliet, bedpan, or urinal?: A Lot ?Help from another person bathing (including washing, rinsing, drying)?: A Lot ?Help from another person to put on and taking off regular upper body clothing?: A Little ?Help from another person to put on and taking off regular lower body clothing?: A Lot ?6 Click Score: 17 ? ?  ?End of Session Equipment Utilized During Treatment: Gait belt;Rolling walker (2 wheels) ? ?OT Visit Diagnosis: Muscle weakness (generalized) (M62.81);Unsteadiness on feet  (R26.81) ?  ?Activity Tolerance Patient tolerated treatment well ?  ?Patient Left in chair;with call bell/phone within reach;with nursing/sitter in room;with chair alarm set ?  ?Nurse Communication Mobility status ?  ? ?   ? ?Time: 1610-9604 ?OT Time Calculation (min): 21 min ? ?Charges: OT General Charges ?$OT Visit: 1 Visit ?OT Treatments ?$Self Care/Home Management : 8-22 mins ? ?Shanon Payor, OTD OTR/L  ?04/15/21, 10:05 AM  ?

## 2021-04-15 NOTE — Progress Notes (Signed)
[place] at [facility] ? ? ?PATIENT NAME: Jamie Brennan   ? ?MR#:  [id number] ? ?DATE OF BIRTH:  1931/05/26 ? ?SUBJECTIVE:  ?patient sitting out in the chair. Denies any complaints no family at bedside. No issues per RN. Denies any chest pain or shortness of ? ? ? ?VITALS:  ?Blood pressure 99/77, pulse 92, temperature (!) 97.5 ?F (36.4 ?C), resp. rate 16, height '5\' 9"'$  (1.753 m), weight 58.6 kg, SpO2 100 %. ? ?PHYSICAL EXAMINATION:  ? ?GENERAL:  88 y.o.-year-old patient lying in the bed with no acute distress.  ?LUNGS: Normal breath sounds bilaterally, no wheezing, rales, rhonchi.  ?CARDIOVASCULAR: S1, S2 normal. No murmurs, rubs, or gallops.  ?ABDOMEN: Soft, nontender, nondistended. Bowel sounds present.  ?EXTREMITIES: No  edema b/l.    ?NEUROLOGIC: nonfocal  patient is alert  ?SKIN: No obvious rash, lesion, or ulcer.  ? ?LABORATORY PANEL:  ?CBC ?Recent Labs  ?Lab 04/13/21 ?5916  ?WBC 10.2  ?HGB 9.9*  ?HCT 30.7*  ?PLT 373  ? ? ?Chemistries  ?Recent Labs  ?Lab 04/13/21 ?3846  ?NA 135  ?K 3.9  ?CL 100  ?CO2 25  ?GLUCOSE 122*  ?BUN 44*  ?CREATININE 1.66*  ?CALCIUM 8.2*  ?MG 2.2  ? ?Assessment and Plan ?Jamie Brennan is a 88 y.o. male with medical history significant for prostate cancer, recent left hip arthroplasty who presented to the ER for evaluation of weakness and confusion. ?  ?Patient is status post left total hip arthroplasty, completed rehab at SNF and has been getting physical therapy at home.  According to his niece he had chest pain 2 days prior to presentation but denied having any other symptoms.  Over the last 2 days he has become increasingly weak and has been unable to ambulate with his rolling walker.  Pt was found to have STEMI on presentation. ? ?Acute ST elevation myocardial infarction (STEMI) ([place]) ?--Late presenting. ?--Patient presents for evaluation of weakness and is found to have trop >24,000. ?--Per his niece he had chest pain 2 days prior to  presentation. ?--Twelve-lead EKG shows ST elevations in V1 and V2 but also has Q waves consistent with subacute MI ?--complete 48 hours of heparin gtt  ?--defer heart cath, per cardiology Dr Josefa Half ?--cont ASA and plavix ?  ?Acute systolic CHF (congestive heart failure) ([place]) ?--Echo showed LVEF 30 to 35%, anterior apical dyskinesis.  Possibly acute due to STEMI. ?--appeared compensated.  ?--cont Toprol ?--hold off adding ACE due to soft BP ?--No need for diuresis currently ?  ?Stage 3b chronic kidney disease (CKD) ([place]) ?--Cr at baseline around 1.7 ?  ?Closed left hip fracture ([place]) ?--recent IM nail on 02/21/21.  Already had rehab and went back home.   ?--PT/OT recommends rehab ?  ? ? ? ? ?Procedures: ?Family communication :Niece Debbie--unable to leave VM ?Consults : [facility] cardiology ?CODE STATUS: DNR ?DVT Prophylaxis :SCD ?Level of care: Med-Surg ?Status is: Inpatient ?Remains inpatient appropriate because: Awaiting Insurance auth ?  ? ?TOTAL TIME TAKING CARE OF THIS PATIENT: 25 minutes.  ?>50% time spent on counselling and coordination of care ? ?Note: This dictation was prepared with Dragon dictation along with smaller phrase technology. Any transcriptional errors that result from this process are unintentional. ? ?Fritzi Mandes M.D  ? ? ?[facility]  ? ?CC: ?Primary care physician; Adin Hector, MD  ?

## 2021-04-15 NOTE — TOC Progression Note (Signed)
Transition of Care (TOC) - Progression Note  ? ? ?Patient Details  ?Name: Jamie Brennan ?MRN: [id number] ?Date of Birth: April 29, 1931 ? ?Transition of Care (TOC) CM/SW Contact  ?Conception Oms, RN ?Phone Number: ?04/15/2021, 10:29 AM ? ?Clinical Narrative:   Reached out to [facility] asking that they review the patient for bed offers, awaiting a bed offer ? ? ? ?Expected Discharge Plan: [place] ?Barriers to Discharge: Continued Medical Work up ? ?Expected Discharge Plan and Services ?Expected Discharge Plan: [place] ?  ?  ?  ?Living arrangements for the past 2 months: [place] ?                ?  ?  ?  ?  ?  ?  ?  ?  ?  ?  ? ? ?Social Determinants of Health (SDOH) Interventions ?  ? ?Readmission Risk Interventions ?   ? View : No data to display.  ?  ?  ?  ? ? ?

## 2021-04-16 MED ORDER — MEDIHONEY WOUND/BURN DRESSING EX PSTE
1.0000 "application " | PASTE | Freq: Every day | CUTANEOUS | Status: DC
  Administered 2021-04-16 – 2021-04-17 (×2): 1 via TOPICAL
  Filled 2021-04-16: qty 103

## 2021-04-16 NOTE — TOC Progression Note (Signed)
Transition of Care (TOC) - Progression Note  ? ? ?Patient Details  ?Name: JEREMY DITULLIO ?MRN: [id number] ?Date of Birth: October 06, 1931 ? ?Transition of Care (TOC) CM/SW Contact  ?Conception Oms, RN ?Phone Number: ?04/16/2021, 9:53 AM ? ?Clinical Narrative:   [facility] will look to see if they can accept the patient, Ins approved [id number] ? ? ? ?Expected Discharge Plan: [place] ?Barriers to Discharge: Continued Medical Work up ? ?Expected Discharge Plan and Services ?Expected Discharge Plan: [place] ?  ?  ?  ?Living arrangements for the past 2 months: [place] ?                ?  ?  ?  ?  ?  ?  ?  ?  ?  ?  ? ? ?Social Determinants of Health (SDOH) Interventions ?  ? ?Readmission Risk Interventions ?   ? View : No data to display.  ?  ?  ?  ? ? ?

## 2021-04-16 NOTE — TOC Progression Note (Addendum)
Transition of Care (TOC) - Progression Note  ? ? ?Patient Details  ?Name: Jamie Brennan ?MRN: [id number] ?Date of Birth: Apr 27, 1931 ? ?Transition of Care (TOC) CM/SW Contact  ?Conception Oms, RN ?Phone Number: ?04/16/2021, 8:36 AM ? ?Clinical Narrative:   [facility] called and stated that they are not in Network with his insurance and therefore will need to rescind the bed offer ? ?I called and spoke to [name] his niece I explained the situation  ?I reviewed the only remaining bed offers, She is going to call his Ins and find out who they are INN with as well as call a few facilities to find out what the Out of pocket cost would be to ensure that he get a good facility, she will call me back ? ? ?Expected Discharge Plan: [place] ?Barriers to Discharge: Continued Medical Work up ? ?Expected Discharge Plan and Services ?Expected Discharge Plan: [place] ?  ?  ?  ?Living arrangements for the past 2 months: [place] ?                ?  ?  ?  ?  ?  ?  ?  ?  ?  ?  ? ? ?Social Determinants of Health (SDOH) Interventions ?  ? ?Readmission Risk Interventions ?   ? View : No data to display.  ?  ?  ?  ? ? ?

## 2021-04-16 NOTE — Plan of Care (Signed)

## 2021-04-16 NOTE — Progress Notes (Signed)
[place] at [facility] ? ? ?PATIENT Brennan: Jamie Brennan   ? ?MR#:  [id number] ? ?DATE OF BIRTH:  07-18-1931 ? ?SUBJECTIVE:  ?patient sitting out in the chair. Denies any complaints no family at bedside. No issues per RN. Denies any chest pain or shortness of ? ? ? ?VITALS:  ?Blood pressure 99/83, pulse 94, temperature 97.8 ?F (36.6 ?C), resp. rate 15, height '5\' 9"'$  (1.753 m), weight 58.6 kg, SpO2 100 %. ? ?PHYSICAL EXAMINATION:  ? ?GENERAL:  88 y.o.-year-old patient lying in the bed with no acute distress.  ?LUNGS: Normal breath sounds bilaterally, no wheezing, rales, rhonchi.  ?CARDIOVASCULAR: S1, S2 normal. No murmurs, rubs, or gallops.  ?ABDOMEN: Soft, nontender, nondistended. Bowel sounds present.  ?EXTREMITIES: No  edema b/l.    ?NEUROLOGIC: nonfocal  patient is alert  ?SKIN:   ? ? ? ? ? ? ?LABORATORY PANEL:  ?CBC ?Recent Labs  ?Lab 04/13/21 ?0626  ?WBC 10.2  ?HGB 9.9*  ?HCT 30.7*  ?PLT 373  ? ? ? ?Chemistries  ?Recent Labs  ?Lab 04/13/21 ?9485  ?NA 135  ?K 3.9  ?CL 100  ?CO2 25  ?GLUCOSE 122*  ?BUN 44*  ?CREATININE 1.66*  ?CALCIUM 8.2*  ?MG 2.2  ? ? ?Assessment and Plan ?Jamie Brennan is a 88 y.o. male with medical history significant for prostate cancer, recent left hip arthroplasty who presented to the ER for evaluation of weakness and confusion. ?  ?Patient is status post left total hip arthroplasty, completed rehab at SNF and has been getting physical therapy at home.  According to his niece he had chest pain 2 days prior to presentation but denied having any other symptoms.  Over the last 2 days he has become increasingly weak and has been unable to ambulate with his rolling walker.  Pt was found to have STEMI on presentation. ? ?Acute ST elevation myocardial infarction (STEMI) ([place]) ?--Late presenting. ?--Patient presents for evaluation of weakness and is found to have trop >24,000. ?--Per his niece he had chest pain 2 days prior to presentation. ?--Twelve-lead EKG  shows ST elevations in V1 and V2 but also has Q waves consistent with subacute MI ?--complete 48 hours of heparin gtt  ?--defer heart cath, per cardiology Dr Josefa Half ?--cont ASA and plavix ?  ?Acute systolic CHF (congestive heart failure) ([place]) ?--Echo showed LVEF 30 to 35%, anterior apical dyskinesis.  Possibly acute due to STEMI. ?--appeared compensated.  ?--cont Toprol ?--hold off adding ACE due to soft BP ?--No need for diuresis currently ?  ?Stage 3b chronic kidney disease (CKD) ([place]) ?--Cr at baseline around 1.7 ?  ?Closed left hip fracture ([place]) ?--recent IM nail on 02/21/21.  Already had rehab and went back home.   ?--PT/OT recommends rehab ?  ?Bilateral heel ulcers,POA ?- per WOC consult ?1. Paint right heel with betadine; allow to air dry; cover with silicone foam. Re-paint daily beginning 04/14/21. ?2. Clean left heel with saline, pat dry.  Apply layer of Medihoney (comes from pharm), top with dry dressing.  ?Offload bilateral heels with Prevalon boots at all times when not ambulating ? ? ?Procedures:none ?Family communication :Niece Debbie--unable to leave VM ?Consults : [facility] cardiology ?CODE STATUS: DNR ?DVT Prophylaxis :SCD ?Level of care: Med-Surg ?Status is: Inpatient ?Remains inpatient appropriate because: [place] ? See TOC note for d/c plans ? ?TOTAL TIME TAKING CARE OF THIS PATIENT: 25 minutes.  ?>50% time spent on counselling and coordination of care ? ?Note: This  dictation was prepared with Dragon dictation along with smaller phrase technology. Any transcriptional errors that result from this process are unintentional. ? ?Fritzi Mandes M.D  ? ? ?[facility]  ? ?CC: ?Primary care physician; Adin Hector, MD  ?

## 2021-04-16 NOTE — Consult Note (Signed)
Re-consulted for DC wound care recommendations ?Right heel unchanged, I will leave topical are as is. ?Left heel not drying up as I expected, will update orders. DC silver and add debridement ointment. See updated orders. Prevalon boots ordered Monday for offloading the heels, should be sent with patient to rehab.  ?Re consult if needed, will not follow at this time. ?Thanks ? Lamyiah Crawshaw [facility] MSN, RN,CWOCN, CNS, CWON-AP [phone])  ?

## 2021-04-16 NOTE — TOC Progression Note (Signed)
Transition of Care (TOC) - Progression Note  ? ? ?Patient Details  ?Name: Jamie Brennan ?MRN: [id number] ?Date of Birth: 10/16/1931 ? ?Transition of Care (TOC) CM/SW Contact  ?Conception Oms, RN ?Phone Number: ?04/16/2021, 3:57 PM ? ?Clinical Narrative:   Spoke with the patient and his niece [name] in the room, They chose to go to [facility], I called [employer] and requested Ins Josem Kaufmann It is approved [id number] ? ? ? ? ? ?Expected Discharge Plan: [place] ?Barriers to Discharge: Continued Medical Work up ? ?Expected Discharge Plan and Services ?Expected Discharge Plan: [place] ?  ?  ?  ?Living arrangements for the past 2 months: [place] ?                ?  ?  ?  ?  ?  ?  ?  ?  ?  ?  ? ? ?Social Determinants of Health (SDOH) Interventions ?  ? ?Readmission Risk Interventions ?   ? View : No data to display.  ?  ?  ?  ? ? ?

## 2021-04-16 NOTE — Progress Notes (Signed)
Physical Therapy Treatment ?Patient Details ?Name: Jamie Brennan ?MRN: [id number] ?DOB: 08-14-1931 ?Today's Date: 04/16/2021 ? ? ?History of Present Illness Pt is an 88 y/o M admitted on 04/10/21 after presenting to the ER for evaluation of weakness & confusion. Pt also c/o chest pain for 2 days prior to arriving to ED. Pt found to have STEMI, late presentation. PMH: prostate CA, s/p recent L THA ? ?  ?PT Comments  ? ? Pt received up in recliner, denies pain, agreeable to PT.  Good tolerance for B LE strengthening exercises in sitting. Pt able to stand from chair with MinA and complete gait training with RW, MinA for balance and safety x 3f.  Pt with improved functional mobility noted from previous session. Pt will benefit from SNF placement to regain strength and functional independence prior to returning home.  ?  ?Recommendations for follow up therapy are one component of a multi-disciplinary discharge planning process, led by the attending physician.  Recommendations may be updated based on patient status, additional functional criteria and insurance authorization. ? ?Follow Up Recommendations ? Skilled nursing-short term rehab (<3 hours/day) ?  ?  ?Assistance Recommended at Discharge Frequent or constant Supervision/Assistance  ?Patient can return home with the following Direct supervision/assist for medications management;Help with stairs or ramp for entrance;Assist for transportation;Direct supervision/assist for financial management;Assistance with cooking/housework;A little help with walking and/or transfers ?  ?Equipment Recommendations ? Rolling walker (2 wheels)  ?  ?Recommendations for Other Services   ? ? ?  ?Precautions / Restrictions Precautions ?Precautions: Fall ?Restrictions ?Weight Bearing Restrictions: No ?LLE Weight Bearing: Weight bearing as tolerated  ?  ? ?Mobility ? Bed Mobility ?  ?  ?  ?  ?  ?  ?  ?General bed mobility comments: NT, pt in recliner ?  ? ?Transfers ?Overall transfer  level: Needs assistance ?Equipment used: Rolling walker (2 wheels) ?Transfers: Sit to/from Stand ?Sit to Stand: Min assist ?  ?  ?  ?  ?  ?  ?  ? ?Ambulation/Gait ?Ambulation/Gait assistance: Min assist ?Gait Distance (Feet): 30 Feet ?Assistive device: Rolling walker (2 wheels) ?Gait Pattern/deviations: Step-to pattern, Trunk flexed, Antalgic, Decreased stance time - left ?Gait velocity: decreased ?  ?  ?General Gait Details:  (Verbal cues to increase BOS and maintain upright posture/knee extension) ? ? ?Stairs ?  ?  ?  ?  ?  ? ? ?Wheelchair Mobility ?  ? ?Modified Rankin (Stroke Patients Only) ?  ? ? ?  ?Balance   ?  ?  ?  ?  ?  ?  ?  ?  ?  ?  ?  ?  ?  ?  ?  ?  ?  ?  ?  ? ?  ?Cognition Arousal/Alertness: Awake/alert ?Behavior [facility]: [facility]/performed ?Overall Cognitive Status: Within Functional Limits for tasks assessed ?  ?  ?  ?  ?  ?  ?  ?  ?  ?  ?  ?  ?  ?  ?  ?  ?General Comments: pt is alert and oriented x4 on this date, fair-good awareness of deficits ?  ?  ? ?  ?Exercises General Exercises - Lower Extremity ?Ankle Circles/Pumps: AROM, Both, 20 reps ?Long Arc Quad: AROM, Strengthening, Both, 10 reps, Seated ?Hip ABduction/ADduction: AROM, Both, 10 reps, Seated ?Hip Flexion/Marching: AROM, Both, 10 reps, Seated ? ?  ?General Comments   ?  ?  ? ?Pertinent Vitals/Pain Pain Assessment ?Pain Assessment: No/denies pain  ? ? ?  Home Living   ?  ?  ?  ?  ?  ?  ?  ?  ?  ?   ?  ?Prior Function    ?  ?  ?   ? ?PT Goals (current goals can now be found in the care plan section) Acute Rehab PT Goals ?Patient Stated Goal: get better ?Progress towards PT goals: Progressing toward goals ? ?  ?Frequency ? ? ? Min 2X/week ? ? ? ?  ?PT Plan Current plan remains appropriate  ? ? ?Co-evaluation   ?  ?  ?  ?  ? ?  ?AM-PAC PT "6 Clicks" Mobility   ?Outcome Measure ? Help needed turning from your back to your side while in a flat bed without using bedrails?: A Lot ?Help needed moving from lying on your back to  sitting on the side of a flat bed without using bedrails?: A Lot ?Help needed moving to and from a bed to a chair (including a wheelchair)?: A Little ?Help needed standing up from a chair using your arms (e.g., wheelchair or bedside chair)?: A Little ?Help needed to walk in hospital room?: A Little ?Help needed climbing 3-5 steps with a railing? : Total ?6 Click Score: 14 ? ?  ?End of Session Equipment Utilized During Treatment: Gait belt ?Activity Tolerance: Patient limited by fatigue ?Patient left: with call bell/phone within reach;in chair;with chair alarm set ?Nurse Communication: Mobility status ?PT Visit Diagnosis: Unsteadiness on feet (R26.81);Difficulty in walking, not elsewhere classified (R26.2);Muscle weakness (generalized) (M62.81);Pain;Other abnormalities of gait and mobility (R26.89) ?  ? ? ?Time: 6606-3016 ?PT Time Calculation (min) (ACUTE ONLY): 40 min ? ?Charges:  $Gait Training: 8-22 mins ?$Therapeutic Exercise: 8-22 mins ?$Therapeutic Activity: 8-22 mins          ?          ?Mikel Cella, PTA ? ? ?Jamie Brennan ?04/16/2021, 2:43 PM ? ?

## 2021-04-17 MED ORDER — HYDROCODONE-ACETAMINOPHEN 5-325 MG PO TABS: 1.0000 | ORAL_TABLET | Freq: Four times a day (QID) | ORAL | 0 refills | Status: AC | PRN

## 2021-04-17 MED ORDER — METOPROLOL SUCCINATE ER 25 MG PO TB24: 12.5000 mg | ORAL_TABLET | Freq: Every day | ORAL | 1 refills | Status: AC

## 2021-04-17 MED ORDER — MEDIHONEY WOUND/BURN DRESSING EX PSTE: 1.0000 "application " | PASTE | Freq: Every day | CUTANEOUS | 0 refills | Status: AC

## 2021-04-17 MED ORDER — CLOPIDOGREL BISULFATE 75 MG PO TABS: 75.0000 mg | ORAL_TABLET | Freq: Every day | ORAL | 1 refills | Status: AC

## 2021-04-17 MED ORDER — ASPIRIN 81 MG PO TBEC: 81.0000 mg | DELAYED_RELEASE_TABLET | Freq: Every day | ORAL | 11 refills | Status: AC

## 2021-04-17 MED ORDER — ATORVASTATIN CALCIUM 40 MG PO TABS
40.0000 mg | ORAL_TABLET | Freq: Every day | ORAL | 0 refills | Status: AC
Start: 2021-04-18 — End: ?

## 2021-04-17 MED ORDER — NITROGLYCERIN 0.4 MG SL SUBL
0.4000 mg | SUBLINGUAL_TABLET | SUBLINGUAL | 12 refills | Status: AC | PRN
Start: 2021-04-17 — End: ?

## 2021-04-17 NOTE — Progress Notes (Signed)
Report called to [facility].  ?

## 2021-04-17 NOTE — Progress Notes (Signed)
Pt wheeled to car by staff. Niece providing transportation to rehab facility. Paper work sent with pt.  ?

## 2021-04-17 NOTE — TOC Progression Note (Addendum)
Transition of Care (TOC) - Progression Note  ? ? ?Patient Details  ?Name: Jamie Brennan ?MRN: [id number] ?Date of Birth: [birth year] ? ?Transition of Care (TOC) CM/SW Contact  ?Conception Oms, RN ?Phone Number: ?04/17/2021, 10:42 AM ? ?Clinical Narrative:   Called EMS to transport the patient to [facility] in [place] room 106P ?They said they have no [employer] trucks so they are not sure if they can transport or not, They will call back if not ?I was called by EMS and they are not able to transport the patient,  ?His niece [name] will transport him to [facility]  ?She will call the nurses desk and let them know when she is on the ay so that they can get him ready ? ? ? ?Expected Discharge Plan: [place] ?Barriers to Discharge: Continued Medical Work up ? ?Expected Discharge Plan and Services ?Expected Discharge Plan: [place] ?  ?  ?  ?Living arrangements for the past 2 months: [place] ?Expected Discharge Date: 04/17/21               ?  ?  ?  ?  ?  ?  ?  ?  ?  ?  ? ? ?Social Determinants of Health (SDOH) Interventions ?  ? ?Readmission Risk Interventions ?   ? View : No data to display.  ?  ?  ?  ? ? ?

## 2021-04-17 NOTE — Discharge Summary (Signed)
?Physician Discharge Summary ?  ?Patient: Jamie Brennan MRN: [id number] DOB: October 08, 1931  ?Admit date:     04/10/2021  ?Discharge date: 04/17/21  ?Discharge Physician: Fritzi Mandes  ? ?PCP: Adin Hector, MD  ? ?Recommendations at discharge:  ? ?follow-up Dr. Clayborn Bigness April 10 in the afternoon ?follow-up Dr. Lavone Neri client and 1 to 2 weeks ? ?Discharge Diagnoses: ?acute STEMI-- medical management ?acute systolic congestive heart failure-- well compensated ?stage IIIB chronic kidney disease ?bilateral heel ulcer present on admission ? ?Hospital Course: ? ?Jamie Brennan is a 88 y.o. male with medical history significant for prostate cancer, recent left hip arthroplasty who presented to the ER for evaluation of weakness and confusion. ?  ?Patient is status post left total hip arthroplasty, completed rehab at SNF and has been getting physical therapy at home.  According to his niece he had chest pain 2 days prior to presentation but denied having any other symptoms.  Over the last 2 days he has become increasingly weak and has been unable to ambulate with his rolling walker.  Pt was found to have STEMI on presentation. ?  ?Acute ST elevation myocardial infarction (STEMI) ([place]) ?--Late presenting. ?--Patient presents for evaluation of weakness and is found to have trop >24,000. ?--Per his niece he had chest pain 2 days prior to presentation. ?--Twelve-lead EKG shows ST elevations in V1 and V2 but also has Q waves consistent with subacute MI ?--complete 48 hours of heparin gtt  ?--defer heart cath, per cardiology Dr Josefa Half ?--cont ASA ,plavix statin ?-- patient will follow-up with Dr. Clayborn Bigness as outpatient ?  ?Acute systolic CHF (congestive heart failure) ([place]) ?--Echo showed LVEF 30 to 35%, anterior apical dyskinesis.  Possibly acute due to STEMI. ?--appeared compensated.  ?--cont Toprol ?--hold off adding ACE due to soft BP ?--No need for diuresis currently ?  ?Stage 3b chronic kidney disease (CKD) ([place]) ?--Cr  at baseline around 1.7 ?  ?Closed left hip fracture ([place]) ?--recent IM nail on 02/21/21.  Already had rehab and went back home.   ?--PT/OT recommends rehab ?  ?Bilateral heel ulcers,POA ?- per WOC consult ?1. Paint right heel with betadine; allow to air dry; cover with silicone foam. Re-paint daily beginning 04/14/21. ?2. Clean left heel with saline, pat dry.  Apply layer of Medihoney (comes from pharm), top with dry dressing.  ?Offload bilateral heels with Prevalon boots at all times when not ambulating ?  ?  ?Procedures:none ?Family communication :Niece [name] where patient is going to rehab ?Consults : [facility] cardiology ?CODE STATUS: DNR ?DVT Prophylaxis :SCD ?Level of care: Med-Surg ? ?  ? ? ?Consultants: cardiology [place] [facility] ?Procedures performed: none ?Disposition: Skilled nursing facility ?Diet recommendation:  ?Discharge Diet Orders (From admission, onward)  ? ?  Start     Ordered  ? 04/17/21 0000  Diet - low sodium heart healthy       ? 04/17/21 0953  ? ?  ?  ? ?  ? ?Cardiac diet ?DISCHARGE MEDICATION: ?Allergies as of 04/17/2021   ?No Known Allergies ?  ? ?  ?Medication List  ?  ? ?STOP taking these medications   ? ?azelastine 0.1 % nasal spray ?Commonly known as: ASTELIN ?  ?polyethylene glycol 17 g packet ?Commonly known as: MIRALAX / GLYCOLAX ?  ?Voltaren 1 % Gel ?Generic drug: diclofenac Sodium ?  ? ?  ? ?TAKE these medications   ? ?acetaminophen 500 MG tablet ?Commonly known as: TYLENOL ?Take 1 tablet (500 mg total) by  mouth every 6 (six) hours as needed for mild pain, moderate pain, fever or headache. ?  ?aspirin 81 MG EC tablet ?Take 1 tablet (81 mg total) by mouth daily. Swallow whole. ?Start taking on: April 18, 2021 ?  ?atorvastatin 40 MG tablet ?Commonly known as: LIPITOR ?Take 1 tablet (40 mg total) by mouth daily. ?Start taking on: April 18, 2021 ?  ?clopidogrel 75 MG tablet ?Commonly known as: PLAVIX ?Take 1 tablet (75 mg total) by mouth daily. ?Start taking on: April 18, 2021 ?  ?docusate sodium  100 MG capsule ?Commonly known as: COLACE ?Take 2 capsules (200 mg total) by mouth 2 (two) times daily. ?  ?Florastor 250 MG capsule ?Generic drug: saccharomyces boulardii ?Take 250 mg by mouth 2 (two) times daily. ?  ?HYDROcodone-acetaminophen 5-325 MG tablet ?Commonly known as: NORCO/VICODIN ?Take 1-2 tablets by mouth every 6 (six) hours as needed for moderate pain. ?  ?leptospermum manuka honey Pste paste ?Apply 1 application. topically daily. ?Start taking on: April 18, 2021 ?  ?metoprolol succinate 25 MG 24 hr tablet ?Commonly known as: TOPROL-XL ?Take 0.5 tablets (12.5 mg total) by mouth daily. ?Start taking on: April 18, 2021 ?  ?nitroGLYCERIN 0.4 MG SL tablet ?Commonly known as: NITROSTAT ?Place 1 tablet (0.4 mg total) under the tongue every 5 (five) minutes x 3 doses as needed for chest pain. ?  ?Protonix 40 MG tablet ?Generic drug: pantoprazole ?Take 40 mg by mouth daily. ?  ?traZODone 50 MG tablet ?Commonly known as: DESYREL ?Take 0.5 tablets (25 mg total) by mouth at bedtime as needed for sleep. ?  ?vitamin B-12 1000 MCG tablet ?Commonly known as: CYANOCOBALAMIN ?Take 1 tablet (1,000 mcg total) by mouth daily. ?  ? ?  ? ?  ?  ? ? ?  ?Discharge Care Instructions  ?(From admission, onward)  ?  ? ? ?  ? ?  Start     Ordered  ? 04/17/21 0000  Discharge wound care:       ?Comments: 04/17/21 0500    ?Wound care  Daily      ?Comments: 1. Paint right heel with betadine; allow to air dry; cover with silicone foam. Re-paint daily beginning 04/14/21. ?2. Clean left heel with saline, pat dry.  Apply layer of Medihoney (comes from pharm), top with dry dressing.  ?Offload bilateral heels with Prevalon boots at all times when not ambulating ? 04/16/21 0901  ? 04/17/21 0953  ? ?  ?  ? ?  ? ? Contact information for follow-up providers   ? ? Lujean Amel D, MD Follow up on 04/20/2021.   ?Specialties: Cardiology, Internal Medicine ?Why: in the afternoon ( as per schedule) ?Contact information: ?[address] ?[place] [address] ?[phone] ? ? ?  ?  ? ? Adin Hector, MD. Schedule an appointment as soon as possible for a visit in 1 week(s).   ?Specialty: Internal Medicine ?Why: hospital f/u ?Contact information: ?[facility] ?[place] [address] ?[phone] ? ? ?  ?  ? ?  ?  ? ? Contact information for after-discharge care   ? ? Destination   ? ? [facility] .   ?Service: Skilled Nursing ?Contact information: ?[address] ?[place] ?[phone] ? ?  ?  ? ?  ?  ? ?  ?  ? ?  ? ?Discharge Exam: ?Filed Weights  ? 04/10/21 0718 04/10/21 1732 04/11/21 0539  ?Weight: 52 kg  59.5 kg 58.6 kg  ? ? ? ?Condition at discharge:  ? ?The results of significant diagnostics from this hospitalization (including imaging, microbiology, ancillary and laboratory) are listed below for reference.  ? ?Imaging Studies: ?CT Head Wo Contrast ? ?Result Date: 04/10/2021 ?CLINICAL DATA:  Generalized weakness EXAM: CT HEAD WITHOUT CONTRAST TECHNIQUE: Contiguous axial images were obtained from the base of the skull through the vertex without intravenous contrast. RADIATION DOSE REDUCTION: This exam was performed according to the departmental dose-optimization program which includes automated exposure control, adjustment of the mA and/or kV according to patient size and/or use of iterative reconstruction technique. COMPARISON:  CT head 02/20/2021 FINDINGS: Brain: There is no evidence of acute intracranial hemorrhage, extra-axial fluid collection, or acute infarct. There is moderate global parenchymal volume loss with prominence of the ventricular system and extra-axial CSF spaces, unchanged. The ventricular system is stable in size and configuration. Gray-white differentiation is preserved. Patchy hypodensity in the subcortical and periventricular white matter likely reflects sequela of chronic white matter microangiopathy. There are small remote lacunar infarcts  in the left basal ganglia/insula, unchanged. There is no mass lesion.  There is no mass effect or midline shift. Vascular: No hyperdense vessel or unexpected calcification. Skull: Normal. Negative for fracture o

## 2021-04-17 NOTE — TOC Progression Note (Signed)
Transition of Care (TOC) - Progression Note  ? ? ?Patient Details  ?Name: Jamie Brennan ?MRN: [id number] ?Date of Birth: March 19, 1931 ? ?Transition of Care (TOC) CM/SW Contact  ?Conception Oms, RN ?Phone Number: ?04/17/2021, 10:19 AM ? ?Clinical Narrative:   Reached out to [facility] the admissions Mudlogger at [facility] and left a secure VM asking for a room number and number to call report, I sent DC summary thru the Hub ? ? ? ?Expected Discharge Plan: [place] ?Barriers to Discharge: Continued Medical Work up ? ?Expected Discharge Plan and Services ?Expected Discharge Plan: [place] ?  ?  ?  ?Living arrangements for the past 2 months: [place] ?Expected Discharge Date: 04/17/21               ?  ?  ?  ?  ?  ?  ?  ?  ?  ?  ? ? ?Social Determinants of Health (SDOH) Interventions ?  ? ?Readmission Risk Interventions ?   ? View : No data to display.  ?  ?  ?  ? ? ?

## 2021-04-17 NOTE — Care Management Important Message (Signed)
Important Message ? ?Patient Details  ?Name: Jamie Brennan ?MRN: [id number] ?Date of Birth: 07/17/1931 ? ? ?Medicare Important Message Given:  Yes ? ? ? ? ?Juliann Pulse A Milind Raether ?04/17/2021, 11:20 AM ?

## 2021-04-21 ENCOUNTER — Emergency Department (HOSPITAL_COMMUNITY): Payer: Medicare Other

## 2021-04-21 ENCOUNTER — Inpatient Hospital Stay (HOSPITAL_COMMUNITY)
Admission: EM | Admit: 2021-04-21 | Discharge: 2021-05-11 | DRG: 871 | Disposition: E | Payer: Medicare Other | Source: Skilled Nursing Facility | Attending: Family Medicine | Admitting: Family Medicine

## 2021-04-21 DIAGNOSIS — J9601 Acute respiratory failure with hypoxia: Secondary | ICD-10-CM

## 2021-04-21 DIAGNOSIS — Y95 Nosocomial condition: Secondary | ICD-10-CM | POA: Diagnosis present

## 2021-04-21 DIAGNOSIS — R0602 Shortness of breath: Secondary | ICD-10-CM | POA: Diagnosis not present

## 2021-04-21 DIAGNOSIS — R7401 Elevation of levels of liver transaminase levels: Secondary | ICD-10-CM

## 2021-04-21 DIAGNOSIS — E860 Dehydration: Secondary | ICD-10-CM | POA: Diagnosis present

## 2021-04-21 DIAGNOSIS — R739 Hyperglycemia, unspecified: Secondary | ICD-10-CM | POA: Diagnosis present

## 2021-04-21 DIAGNOSIS — A419 Sepsis, unspecified organism: Principal | ICD-10-CM

## 2021-04-21 DIAGNOSIS — Z66 Do not resuscitate: Secondary | ICD-10-CM | POA: Diagnosis present

## 2021-04-21 DIAGNOSIS — R230 Cyanosis: Secondary | ICD-10-CM | POA: Diagnosis present

## 2021-04-21 DIAGNOSIS — R6521 Severe sepsis with septic shock: Secondary | ICD-10-CM | POA: Diagnosis present

## 2021-04-21 DIAGNOSIS — I213 ST elevation (STEMI) myocardial infarction of unspecified site: Secondary | ICD-10-CM | POA: Diagnosis present

## 2021-04-21 DIAGNOSIS — M1990 Unspecified osteoarthritis, unspecified site: Secondary | ICD-10-CM | POA: Diagnosis present

## 2021-04-21 DIAGNOSIS — I5022 Chronic systolic (congestive) heart failure: Secondary | ICD-10-CM | POA: Diagnosis present

## 2021-04-21 DIAGNOSIS — I509 Heart failure, unspecified: Secondary | ICD-10-CM | POA: Diagnosis present

## 2021-04-21 DIAGNOSIS — N179 Acute kidney failure, unspecified: Secondary | ICD-10-CM

## 2021-04-21 DIAGNOSIS — J189 Pneumonia, unspecified organism: Secondary | ICD-10-CM

## 2021-04-21 DIAGNOSIS — R4587 Impulsiveness: Secondary | ICD-10-CM | POA: Diagnosis not present

## 2021-04-21 DIAGNOSIS — Z515 Encounter for palliative care: Secondary | ICD-10-CM

## 2021-04-21 DIAGNOSIS — K7200 Acute and subacute hepatic failure without coma: Secondary | ICD-10-CM

## 2021-04-21 DIAGNOSIS — Z7902 Long term (current) use of antithrombotics/antiplatelets: Secondary | ICD-10-CM

## 2021-04-21 DIAGNOSIS — D689 Coagulation defect, unspecified: Secondary | ICD-10-CM | POA: Diagnosis present

## 2021-04-21 DIAGNOSIS — Z7982 Long term (current) use of aspirin: Secondary | ICD-10-CM

## 2021-04-21 DIAGNOSIS — E875 Hyperkalemia: Secondary | ICD-10-CM | POA: Diagnosis present

## 2021-04-21 DIAGNOSIS — D649 Anemia, unspecified: Secondary | ICD-10-CM | POA: Diagnosis present

## 2021-04-21 DIAGNOSIS — E46 Unspecified protein-calorie malnutrition: Secondary | ICD-10-CM | POA: Diagnosis present

## 2021-04-21 DIAGNOSIS — Z8546 Personal history of malignant neoplasm of prostate: Secondary | ICD-10-CM

## 2021-04-21 DIAGNOSIS — Z8616 Personal history of COVID-19: Secondary | ICD-10-CM

## 2021-04-21 DIAGNOSIS — Z79899 Other long term (current) drug therapy: Secondary | ICD-10-CM

## 2021-04-21 DIAGNOSIS — N1832 Chronic kidney disease, stage 3b: Secondary | ICD-10-CM | POA: Diagnosis present

## 2021-04-21 DIAGNOSIS — R54 Age-related physical debility: Secondary | ICD-10-CM | POA: Diagnosis present

## 2021-04-21 DIAGNOSIS — I219 Acute myocardial infarction, unspecified: Secondary | ICD-10-CM

## 2021-04-21 DIAGNOSIS — Z96642 Presence of left artificial hip joint: Secondary | ICD-10-CM | POA: Diagnosis present

## 2021-04-21 DIAGNOSIS — E871 Hypo-osmolality and hyponatremia: Secondary | ICD-10-CM | POA: Diagnosis present

## 2021-04-21 LAB — CBC WITH DIFFERENTIAL/PLATELET
Abs Immature Granulocytes: 0.1 10*3/uL — ABNORMAL HIGH (ref 0.00–0.07)
Basophils Absolute: 0 10*3/uL (ref 0.0–0.1)
Basophils Relative: 0 %
Eosinophils Absolute: 0 10*3/uL (ref 0.0–0.5)
Eosinophils Relative: 0 %
HCT: 37.9 % — ABNORMAL LOW (ref 39.0–52.0)
Hemoglobin: 11.9 g/dL — ABNORMAL LOW (ref 13.0–17.0)
Immature Granulocytes: 1 %
Lymphocytes Relative: 2 %
Lymphs Abs: 0.3 10*3/uL — ABNORMAL LOW (ref 0.7–4.0)
MCH: 30.1 pg (ref 26.0–34.0)
MCHC: 31.4 g/dL (ref 30.0–36.0)
MCV: 95.7 fL (ref 80.0–100.0)
Monocytes Absolute: 1.1 10*3/uL — ABNORMAL HIGH (ref 0.1–1.0)
Monocytes Relative: 6 %
Neutro Abs: 16.1 10*3/uL — ABNORMAL HIGH (ref 1.7–7.7)
Neutrophils Relative %: 91 %
Platelets: 318 10*3/uL (ref 150–400)
RBC: 3.96 MIL/uL — ABNORMAL LOW (ref 4.22–5.81)
RDW: 14.9 % (ref 11.5–15.5)
WBC: 17.5 10*3/uL — ABNORMAL HIGH (ref 4.0–10.5)
nRBC: 0 % (ref 0.0–0.2)

## 2021-04-21 LAB — COMPREHENSIVE METABOLIC PANEL
ALT: 485 U/L — ABNORMAL HIGH (ref 0–44)
AST: 508 U/L — ABNORMAL HIGH (ref 15–41)
Albumin: 2.7 g/dL — ABNORMAL LOW (ref 3.5–5.0)
Alkaline Phosphatase: 435 U/L — ABNORMAL HIGH (ref 38–126)
Anion gap: 20 — ABNORMAL HIGH (ref 5–15)
BUN: 79 mg/dL — ABNORMAL HIGH (ref 8–23)
CO2: 12 mmol/L — ABNORMAL LOW (ref 22–32)
Calcium: 8 mg/dL — ABNORMAL LOW (ref 8.9–10.3)
Chloride: 104 mmol/L (ref 98–111)
Creatinine, Ser: 3.86 mg/dL — ABNORMAL HIGH (ref 0.61–1.24)
GFR, Estimated: 14 mL/min — ABNORMAL LOW (ref >60)
Glucose, Bld: 161 mg/dL — ABNORMAL HIGH (ref 70–99)
Potassium: 5.7 mmol/L — ABNORMAL HIGH (ref 3.5–5.1)
Sodium: 136 mmol/L (ref 135–145)
Total Bilirubin: 1.2 mg/dL (ref 0.3–1.2)
Total Protein: 6 g/dL — ABNORMAL LOW (ref 6.5–8.1)

## 2021-04-21 LAB — LACTIC ACID, PLASMA: Lactic Acid, Venous: 8.6 mmol/L (ref 0.5–1.9)

## 2021-04-21 LAB — BRAIN NATRIURETIC PEPTIDE: B Natriuretic Peptide: 1687.6 pg/mL — ABNORMAL HIGH (ref 0.0–100.0)

## 2021-04-21 LAB — TROPONIN I (HIGH SENSITIVITY): Troponin I (High Sensitivity): 3494 ng/L (ref <18)

## 2021-04-21 MED ORDER — SODIUM CHLORIDE 0.9 % IV BOLUS
1000.0000 mL | Freq: Once | INTRAVENOUS | Status: AC
  Administered 2021-04-21: 1000 mL via INTRAVENOUS

## 2021-04-21 MED ORDER — SODIUM CHLORIDE 0.9 % IV SOLN: 2.0000 g | INTRAVENOUS | Status: DC

## 2021-04-21 MED ORDER — VANCOMYCIN HCL 1250 MG/250ML IV SOLN
1250.0000 mg | Freq: Once | INTRAVENOUS | Status: AC
Start: 2021-04-21 — End: 2021-04-22
  Administered 2021-04-22: 1250 mg via INTRAVENOUS
  Filled 2021-04-21: qty 250

## 2021-04-21 MED ORDER — SODIUM CHLORIDE 0.9 % IV SOLN
2.0000 g | Freq: Once | INTRAVENOUS | Status: AC
  Administered 2021-04-21: 2 g via INTRAVENOUS
  Filled 2021-04-21: qty 12.5

## 2021-04-21 MED ORDER — VANCOMYCIN VARIABLE DOSE PER UNSTABLE RENAL FUNCTION (PHARMACIST DOSING): Status: DC

## 2021-04-21 NOTE — Progress Notes (Addendum)
Pharmacy Antibiotic Note ? ?Jamie Brennan is a 88 y.o. male admitted on 04/14/2021 with pneumonia.  Pharmacy has been consulted for cefepime and vancomycin dosing. ? ?Scr 3.86 - appears elevated from baseline (1.4-1.7). WBC 17.5, no fever noted. Will give loading dose of vancomycin based on body weight (~20 mg/kg) and will f/u renal function for subsequent dosing. ? ? ?Plan: ?Cefepime 2g IV q24h ?Vancomycin 1250 mg IV x1  ?F/u renal function for subsequent dosing ?F/u clinical course, fever curve, cultures ?  ? ?Temp (24hrs), Avg:95.1 ?F (35.1 ?C), Min:95.1 ?F (35.1 ?C), Max:95.1 ?F (35.1 ?C) ? ?Recent Labs  ?Lab 04/22/2021 ?2139  ?WBC 17.5*  ?  ?Estimated Creatinine Clearance: 26 mL/min (A) (by C-G formula based on SCr of 1.66 mg/dL (H)).   ? ?No Known Allergies ? ?Antimicrobials this admission: ?Cefepime 4/11 >>  ?Vancomycin 4/11 >>  ? ?Dose adjustments this admission: ?N/A ? ?Microbiology results: ?4/11 BCx: in process ? ? ?Thank you for allowing pharmacy to be a part of this patient?s care. ? ?Zenaida Deed, PharmD ?PGY1 Acute Care Pharmacy Resident  ?Phone: [phone] ?04/22/2021  12:05 AM ? ?Please check [email or web address] for unit-specific pharmacy phone numbers. ? ? ?

## 2021-04-21 NOTE — ED Triage Notes (Signed)
Pt via [facility] from [facility] as sepsis. Diagnosed at facility w pneumonia today, no antibiotics/meds PTA ?Cyanotic per EMS on arrival, improved w O2 ? ?106/64 ?HR 80-90 ?RR 36 ?SpO2 ? R/t poor perfusion ?

## 2021-04-21 NOTE — ED Provider Notes (Signed)
?[facility] ?Provider Note ? ? ?CSN: [id number] ?Arrival date & time: 04/22/2021  2131 ? ?  ? ?History ? ?CC: Shortness of breath ? ? ?Jamie Brennan is a 88 y.o. male presenting from [facility] with shortness of breath.  Per paramedics onset was earlier today, patient was cyanotic and reportedly hypoxic, although they were not able to get a good oxygen saturation in route to the hospital due to poor perfusion.  Patient was placed on nonrebreather and brought into the ED.  The patient has a difficult time providing any history. ? ?Medical chart review shows that the patient was discharged to a rehab only 4 days ago, after hospitalization for STEMI (V1-V2 elevations), trop > 24,000, per Dr Saralyn Pilar cardiology they decided to defer cardiac cath.  He also had CHF with LVEF 30-35% with apical anterior dyskinesis.  He is DNR with signed MOST form on arrival. ? ?HPI ? ?  ? ?Home Medications ?Prior to Admission medications   ?Medication Sig Start Date End Date Taking? Authorizing Provider  ?acetaminophen (TYLENOL) 500 MG tablet Take 1 tablet (500 mg total) by mouth every 6 (six) hours as needed for mild pain, moderate pain, fever or headache. 02/27/21   Eugenie Filler, MD  ?aspirin EC 81 MG EC tablet Take 1 tablet (81 mg total) by mouth daily. Swallow whole. 04/18/21   Fritzi Mandes, MD  ?atorvastatin (LIPITOR) 40 MG tablet Take 1 tablet (40 mg total) by mouth daily. 04/18/21   Fritzi Mandes, MD  ?clopidogrel (PLAVIX) 75 MG tablet Take 1 tablet (75 mg total) by mouth daily. 04/18/21   Fritzi Mandes, MD  ?docusate sodium (COLACE) 100 MG capsule Take 2 capsules (200 mg total) by mouth 2 (two) times daily. 02/27/21   Eugenie Filler, MD  ?FLORASTOR 250 MG capsule Take 250 mg by mouth 2 (two) times daily. 03/27/21   [provider]  ?HYDROcodone-acetaminophen (NORCO/VICODIN) 5-325 MG tablet Take 1-2 tablets by mouth every 6 (six) hours as needed for moderate pain. 04/17/21   Fritzi Mandes, MD  ?leptospermum manuka honey (MEDIHONEY) PSTE paste Apply 1 application. topically daily. 04/18/21   Fritzi Mandes, MD  ?metoprolol succinate (TOPROL-XL) 25 MG 24 hr tablet Take 0.5 tablets (12.5 mg total) by mouth daily. 04/18/21   Fritzi Mandes, MD  ?nitroGLYCERIN (NITROSTAT) 0.4 MG SL tablet Place 1 tablet (0.4 mg total) under the tongue every 5 (five) minutes x 3 doses as needed for chest pain. 04/17/21   Fritzi Mandes, MD  ?PROTONIX 40 MG tablet Take 40 mg by mouth daily. 03/16/21   [provider]  ?traZODone (DESYREL) 50 MG tablet Take 0.5 tablets (25 mg total) by mouth at bedtime as needed for sleep. 02/27/21   Eugenie Filler, MD  ?vitamin B-12 (CYANOCOBALAMIN) 1000 MCG tablet Take 1 tablet (1,000 mcg total) by mouth daily. 02/27/21   Eugenie Filler, MD  ?   ? ?Allergies    ?Patient has no known allergies.   ? ?Review of Systems   ?Review of Systems ? ?Physical Exam ?Updated Vital Signs ?BP (!) 84/73 (BP Location: Left Arm)   Pulse 89   Temp (!) 95.1 ?F (35.1 ?C) (Rectal)   Resp (!) 35   SpO2 (!) 80%  ?Physical Exam ?Constitutional:   ?   General: He is not in acute distress. ?   Comments: Thin, frail, chronically ill-appearing  ?HENT:  ?   Head: Normocephalic and atraumatic.  ?Eyes:  ?  Conjunctiva/sclera: Conjunctivae normal.  ?   Pupils: Pupils are equal, round, and reactive to light.  ?Cardiovascular:  ?   Rate and Rhythm: Normal rate and regular rhythm.  ?Pulmonary:  ?   Effort: Pulmonary effort is normal.  ?   Comments: Coarse breath sounds diffusely, wet breath sounds, rhonchi bilaterally, RR 28 ?Abdominal:  ?   General: There is no distension.  ?   Tenderness: There is no abdominal tenderness.  ?Skin: ?   General: Skin is warm and dry.  ?Neurological:  ?   General: No focal deficit present.  ?   Mental Status: He is alert. Mental status is at baseline.  ?Psychiatric:     ?   Mood and Affect: Mood normal.     ?   Behavior: Behavior normal.  ? ? ?ED Results / Procedures / Treatments    ?Labs ?(all labs ordered are listed, but only abnormal results are displayed) ?Labs Reviewed  ?LACTIC ACID, PLASMA - Abnormal; Notable for the following components:  ?    Result Value  ? Lactic Acid, Venous 8.6 (*)   ? All other components within normal limits  ?COMPREHENSIVE METABOLIC PANEL - Abnormal; Notable for the following components:  ? Potassium 5.7 (*)   ? CO2 12 (*)   ? Glucose, Bld 161 (*)   ? BUN 79 (*)   ? Creatinine, Ser 3.86 (*)   ? Calcium 8.0 (*)   ? Total Protein 6.0 (*)   ? Albumin 2.7 (*)   ? AST 508 (*)   ? ALT 485 (*)   ? Alkaline Phosphatase 435 (*)   ? GFR, Estimated 14 (*)   ? Anion gap 20 (*)   ? All other components within normal limits  ?CBC WITH DIFFERENTIAL/PLATELET - Abnormal; Notable for the following components:  ? WBC 17.5 (*)   ? RBC 3.96 (*)   ? Hemoglobin 11.9 (*)   ? HCT 37.9 (*)   ? Neutro Abs 16.1 (*)   ? Lymphs Abs 0.3 (*)   ? Monocytes Absolute 1.1 (*)   ? Abs Immature Granulocytes 0.10 (*)   ? All other components within normal limits  ?BRAIN NATRIURETIC PEPTIDE - Abnormal; Notable for the following components:  ? B Natriuretic Peptide 1,687.6 (*)   ? All other components within normal limits  ?TROPONIN I (HIGH SENSITIVITY) - Abnormal; Notable for the following components:  ? Troponin I (High Sensitivity) 3,494 (*)   ? All other components within normal limits  ?CULTURE, BLOOD (ROUTINE X 2)  ?CULTURE, BLOOD (ROUTINE X 2)  ?RESP PANEL BY RT-PCR (FLU A&B, COVID) ARPGX2  ?LACTIC ACID, PLASMA  ?URINALYSIS, ROUTINE W REFLEX MICROSCOPIC  ?PROTIME-INR  ?APTT  ?BLOOD GAS, ARTERIAL  ?TROPONIN I (HIGH SENSITIVITY)  ? ? ?EKG ?EKG Interpretation ? ?Date/Time:  Tuesday April 21 2021 21:36:39 EDT ?Ventricular Rate:  98 ?PR Interval:  162 ?QRS Duration: 134 ?QT Interval:  392 ?QTC Calculation: 477 ?R Axis:   -27 ?Text Interpretation: Sinus rhythm Multiform ventricular premature complexes MIld persistent elevations V2 , overall flattened compared to Apr 10 2021 Confirmed by Octaviano Glow [id number]) on 05/04/2021 9:56:24 PM ? ?Radiology ?DG Chest Port 1 View ? ?Result Date: 04/16/2021 ?CLINICAL DATA:  Questionable sepsis - evaluate for abnormality EXAM: PORTABLE CHEST 1 VIEW COMPARISON:  Chest x-ray 04/10/2021 FINDINGS: The heart and mediastinal contours are unchanged. Aortic calcification. Almost complete opacification of the right upper lobe. Patchy airspace and interstitial opacity within the remainder of the lungs. No  pulmonary edema. At least bilateral trace pleural effusions. No pneumothorax. No acute osseous abnormality. . IMPRESSION: 1. Multifocal pneumonia. Followup PA and lateral chest X-ray is recommended in 3-4 weeks following therapy to ensure resolution. 2.  At least bilateral trace pleural effusions. 3.  Aortic Atherosclerosis (ICD10-I70.0). Electronically Signed   By: Iven Finn M.D.   On: 04/24/2021 22:12   ? ?Procedures ?Marland KitchenCritical Care ?Performed by: [name], MD ?Authorized by: [name], MD  ? ?Critical care provider statement:  ?  Critical care time (minutes):  45 ?  Critical care time was exclusive of:  Separately billable procedures and treating other patients ?  Critical care was necessary to treat or prevent imminent or life-threatening deterioration of the following conditions:  Respiratory failure ?  Critical care was time spent personally by me on the following activities:  Ordering and performing treatments and interventions, ordering and review of laboratory studies, ordering and review of radiographic studies, pulse oximetry, review of old charts, examination of patient and evaluation of patient's response to treatment ?  Care discussed with: admitting provider   ?Comments:  ?   Bipap management  ? ? ?Medications Ordered in ED ?Medications  ?ceFEPIme (MAXIPIME) 2 g in sodium chloride 0.9 % 100 mL IVPB (has no administration in time range)  ?vancomycin (VANCOREADY) IVPB 1250 mg/250 mL (has no administration in time range)  ?sodium chloride 0.9 %  bolus 1,000 mL (has no administration in time range)  ?ceFEPIme (MAXIPIME) 2 g in sodium chloride 0.9 % 100 mL IVPB (has no administration in time range)  ? ? ?ED Course/ Medical Decision Making/ A&P ?Clinical Course

## 2021-04-21 NOTE — ED Notes (Signed)
RT at bedside for Bipap.

## 2021-04-21 NOTE — Sepsis Progress Note (Signed)
Following per sepsis protocol   

## 2021-04-22 DIAGNOSIS — D649 Anemia, unspecified: Secondary | ICD-10-CM | POA: Diagnosis present

## 2021-04-22 DIAGNOSIS — K7200 Acute and subacute hepatic failure without coma: Secondary | ICD-10-CM

## 2021-04-22 DIAGNOSIS — N189 Chronic kidney disease, unspecified: Secondary | ICD-10-CM

## 2021-04-22 DIAGNOSIS — I5022 Chronic systolic (congestive) heart failure: Secondary | ICD-10-CM | POA: Diagnosis present

## 2021-04-22 DIAGNOSIS — R54 Age-related physical debility: Secondary | ICD-10-CM | POA: Diagnosis present

## 2021-04-22 DIAGNOSIS — A419 Sepsis, unspecified organism: Secondary | ICD-10-CM | POA: Diagnosis present

## 2021-04-22 DIAGNOSIS — J9601 Acute respiratory failure with hypoxia: Secondary | ICD-10-CM | POA: Diagnosis present

## 2021-04-22 DIAGNOSIS — Z66 Do not resuscitate: Secondary | ICD-10-CM

## 2021-04-22 DIAGNOSIS — N179 Acute kidney failure, unspecified: Secondary | ICD-10-CM

## 2021-04-22 DIAGNOSIS — N1832 Chronic kidney disease, stage 3b: Secondary | ICD-10-CM | POA: Diagnosis present

## 2021-04-22 DIAGNOSIS — D689 Coagulation defect, unspecified: Secondary | ICD-10-CM | POA: Diagnosis present

## 2021-04-22 DIAGNOSIS — R6521 Severe sepsis with septic shock: Secondary | ICD-10-CM

## 2021-04-22 DIAGNOSIS — Y95 Nosocomial condition: Secondary | ICD-10-CM | POA: Diagnosis present

## 2021-04-22 DIAGNOSIS — Z515 Encounter for palliative care: Secondary | ICD-10-CM

## 2021-04-22 DIAGNOSIS — R230 Cyanosis: Secondary | ICD-10-CM | POA: Diagnosis present

## 2021-04-22 DIAGNOSIS — Z8546 Personal history of malignant neoplasm of prostate: Secondary | ICD-10-CM | POA: Diagnosis not present

## 2021-04-22 DIAGNOSIS — R0602 Shortness of breath: Secondary | ICD-10-CM | POA: Diagnosis present

## 2021-04-22 DIAGNOSIS — E46 Unspecified protein-calorie malnutrition: Secondary | ICD-10-CM | POA: Diagnosis present

## 2021-04-22 DIAGNOSIS — Z96642 Presence of left artificial hip joint: Secondary | ICD-10-CM | POA: Diagnosis present

## 2021-04-22 DIAGNOSIS — I219 Acute myocardial infarction, unspecified: Secondary | ICD-10-CM

## 2021-04-22 DIAGNOSIS — Z7189 Other specified counseling: Secondary | ICD-10-CM

## 2021-04-22 DIAGNOSIS — E871 Hypo-osmolality and hyponatremia: Secondary | ICD-10-CM | POA: Diagnosis present

## 2021-04-22 DIAGNOSIS — I213 ST elevation (STEMI) myocardial infarction of unspecified site: Secondary | ICD-10-CM | POA: Diagnosis present

## 2021-04-22 DIAGNOSIS — Z79899 Other long term (current) drug therapy: Secondary | ICD-10-CM | POA: Diagnosis not present

## 2021-04-22 DIAGNOSIS — R6520 Severe sepsis without septic shock: Secondary | ICD-10-CM | POA: Diagnosis not present

## 2021-04-22 DIAGNOSIS — Z7982 Long term (current) use of aspirin: Secondary | ICD-10-CM | POA: Diagnosis not present

## 2021-04-22 DIAGNOSIS — Z7902 Long term (current) use of antithrombotics/antiplatelets: Secondary | ICD-10-CM | POA: Diagnosis not present

## 2021-04-22 DIAGNOSIS — E860 Dehydration: Secondary | ICD-10-CM | POA: Diagnosis present

## 2021-04-22 DIAGNOSIS — J189 Pneumonia, unspecified organism: Secondary | ICD-10-CM | POA: Diagnosis present

## 2021-04-22 DIAGNOSIS — Z789 Other specified health status: Secondary | ICD-10-CM

## 2021-04-22 DIAGNOSIS — Z8616 Personal history of COVID-19: Secondary | ICD-10-CM | POA: Diagnosis not present

## 2021-04-22 LAB — CORTISOL-AM, BLOOD: Cortisol - AM: 66.5 ug/dL — ABNORMAL HIGH (ref 6.7–22.6)

## 2021-04-22 LAB — CBC
HCT: 34.8 % — ABNORMAL LOW (ref 39.0–52.0)
Hemoglobin: 11.3 g/dL — ABNORMAL LOW (ref 13.0–17.0)
MCH: 30 pg (ref 26.0–34.0)
MCHC: 32.5 g/dL (ref 30.0–36.0)
MCV: 92.3 fL (ref 80.0–100.0)
Platelets: 250 10*3/uL (ref 150–400)
RBC: 3.77 MIL/uL — ABNORMAL LOW (ref 4.22–5.81)
RDW: 14.7 % (ref 11.5–15.5)
WBC: 26.4 10*3/uL — ABNORMAL HIGH (ref 4.0–10.5)
nRBC: 0 % (ref 0.0–0.2)

## 2021-04-22 LAB — PROTIME-INR
INR: 2.1 — ABNORMAL HIGH (ref 0.8–1.2)
INR: 2.1 — ABNORMAL HIGH (ref 0.8–1.2)
Prothrombin Time: 23.1 seconds — ABNORMAL HIGH (ref 11.4–15.2)
Prothrombin Time: 23 seconds — ABNORMAL HIGH (ref 11.4–15.2)

## 2021-04-22 LAB — BASIC METABOLIC PANEL
Anion gap: 18 — ABNORMAL HIGH (ref 5–15)
BUN: 90 mg/dL — ABNORMAL HIGH (ref 8–23)
CO2: 15 mmol/L — ABNORMAL LOW (ref 22–32)
Calcium: 7.7 mg/dL — ABNORMAL LOW (ref 8.9–10.3)
Chloride: 105 mmol/L (ref 98–111)
Creatinine, Ser: 4.19 mg/dL — ABNORMAL HIGH (ref 0.61–1.24)
GFR, Estimated: 13 mL/min — ABNORMAL LOW (ref >60)
Glucose, Bld: 137 mg/dL — ABNORMAL HIGH (ref 70–99)
Potassium: 6 mmol/L — ABNORMAL HIGH (ref 3.5–5.1)
Sodium: 138 mmol/L (ref 135–145)

## 2021-04-22 LAB — I-STAT ARTERIAL BLOOD GAS, ED
Acid-base deficit: 14 mmol/L — ABNORMAL HIGH (ref 0.0–2.0)
Bicarbonate: 10.7 mmol/L — ABNORMAL LOW (ref 20.0–28.0)
Calcium, Ion: 1.05 mmol/L — ABNORMAL LOW (ref 1.15–1.40)
HCT: 33 % — ABNORMAL LOW (ref 39.0–52.0)
Hemoglobin: 11.2 g/dL — ABNORMAL LOW (ref 13.0–17.0)
O2 Saturation: 100 %
Patient temperature: 95.1
Potassium: 5.4 mmol/L — ABNORMAL HIGH (ref 3.5–5.1)
Sodium: 133 mmol/L — ABNORMAL LOW (ref 135–145)
TCO2: 11 mmol/L — ABNORMAL LOW (ref 22–32)
pCO2 arterial: 20.2 mmHg — ABNORMAL LOW (ref 32–48)
pH, Arterial: 7.32 — ABNORMAL LOW (ref 7.35–7.45)
pO2, Arterial: 422 mmHg — ABNORMAL HIGH (ref 83–108)

## 2021-04-22 LAB — COMPREHENSIVE METABOLIC PANEL
ALT: 860 U/L — ABNORMAL HIGH (ref 0–44)
AST: 1237 U/L — ABNORMAL HIGH (ref 15–41)
Albumin: 2.5 g/dL — ABNORMAL LOW (ref 3.5–5.0)
Alkaline Phosphatase: 349 U/L — ABNORMAL HIGH (ref 38–126)
Anion gap: 18 — ABNORMAL HIGH (ref 5–15)
BUN: 84 mg/dL — ABNORMAL HIGH (ref 8–23)
CO2: 12 mmol/L — ABNORMAL LOW (ref 22–32)
Calcium: 7.7 mg/dL — ABNORMAL LOW (ref 8.9–10.3)
Chloride: 106 mmol/L (ref 98–111)
Creatinine, Ser: 3.87 mg/dL — ABNORMAL HIGH (ref 0.61–1.24)
GFR, Estimated: 14 mL/min — ABNORMAL LOW (ref >60)
Glucose, Bld: 132 mg/dL — ABNORMAL HIGH (ref 70–99)
Potassium: 5.9 mmol/L — ABNORMAL HIGH (ref 3.5–5.1)
Sodium: 136 mmol/L (ref 135–145)
Total Bilirubin: 1.1 mg/dL (ref 0.3–1.2)
Total Protein: 5.6 g/dL — ABNORMAL LOW (ref 6.5–8.1)

## 2021-04-22 LAB — LACTIC ACID, PLASMA
Lactic Acid, Venous: >9 mmol/L (ref 0.5–1.9)
Lactic Acid, Venous: 4.8 mmol/L (ref 0.5–1.9)
Lactic Acid, Venous: 5.7 mmol/L (ref 0.5–1.9)

## 2021-04-22 LAB — PROCALCITONIN: Procalcitonin: 2.26 ng/mL

## 2021-04-22 LAB — HEPARIN LEVEL (UNFRACTIONATED): Heparin Unfractionated: <0.1 IU/mL — ABNORMAL LOW (ref 0.30–0.70)

## 2021-04-22 LAB — APTT: aPTT: 33 seconds (ref 24–36)

## 2021-04-22 LAB — TROPONIN I (HIGH SENSITIVITY): Troponin I (High Sensitivity): 3267 ng/L (ref <18)

## 2021-04-22 MED ORDER — MAGNESIUM HYDROXIDE 400 MG/5ML PO SUSP: 30.0000 mL | Freq: Every day | ORAL | Status: DC | PRN

## 2021-04-22 MED ORDER — HYDROMORPHONE HCL 1 MG/ML PO LIQD: 1.0000 mg | ORAL | Status: DC | PRN

## 2021-04-22 MED ORDER — ASPIRIN 300 MG RE SUPP
300.0000 mg | Freq: Every day | RECTAL | Status: DC
  Administered 2021-04-22: 300 mg via RECTAL
  Filled 2021-04-22: qty 1

## 2021-04-22 MED ORDER — LORAZEPAM 2 MG/ML PO CONC
1.0000 mg | Freq: Once | ORAL | Status: AC
  Administered 2021-04-22: 1 mg via ORAL
  Filled 2021-04-22: qty 1

## 2021-04-22 MED ORDER — HALOPERIDOL LACTATE 5 MG/ML IJ SOLN: 2.0000 mg | Freq: Four times a day (QID) | INTRAMUSCULAR | Status: DC | PRN

## 2021-04-22 MED ORDER — VITAMIN B-12 1000 MCG PO TABS: 1000.0000 ug | ORAL_TABLET | Freq: Every day | ORAL | Status: DC

## 2021-04-22 MED ORDER — DOCUSATE SODIUM 100 MG PO CAPS: 200.0000 mg | ORAL_CAPSULE | Freq: Two times a day (BID) | ORAL | Status: DC

## 2021-04-22 MED ORDER — ACETAMINOPHEN 325 MG PO TABS: 650.0000 mg | ORAL_TABLET | Freq: Four times a day (QID) | ORAL | Status: DC | PRN

## 2021-04-22 MED ORDER — FUROSEMIDE 10 MG/ML IJ SOLN
40.0000 mg | Freq: Two times a day (BID) | INTRAMUSCULAR | Status: DC
Start: 2021-04-22 — End: 2021-04-22
  Filled 2021-04-22: qty 4

## 2021-04-22 MED ORDER — HALOPERIDOL LACTATE 2 MG/ML PO CONC
2.0000 mg | Freq: Four times a day (QID) | ORAL | Status: DC | PRN
  Filled 2021-04-22: qty 1

## 2021-04-22 MED ORDER — CALCIUM GLUCONATE-NACL 1-0.675 GM/50ML-% IV SOLN
1.0000 g | Freq: Once | INTRAVENOUS | Status: AC
  Administered 2021-04-22: 1000 mg via INTRAVENOUS
  Filled 2021-04-22: qty 50

## 2021-04-22 MED ORDER — HYDROMORPHONE HCL 1 MG/ML IJ SOLN: 0.5000 mg | INTRAMUSCULAR | Status: DC | PRN

## 2021-04-22 MED ORDER — SODIUM CHLORIDE 0.9 % IV SOLN: 2.0000 g | Freq: Once | INTRAVENOUS | Status: DC

## 2021-04-22 MED ORDER — LORAZEPAM 2 MG/ML IJ SOLN
0.5000 mg | INTRAMUSCULAR | Status: DC
  Administered 2021-04-22: 0.5 mg via INTRAVENOUS
  Filled 2021-04-22: qty 1

## 2021-04-22 MED ORDER — METOPROLOL SUCCINATE ER 25 MG PO TB24: 12.5000 mg | ORAL_TABLET | Freq: Every day | ORAL | Status: DC

## 2021-04-22 MED ORDER — CLOPIDOGREL BISULFATE 75 MG PO TABS: 75.0000 mg | ORAL_TABLET | Freq: Every day | ORAL | Status: DC

## 2021-04-22 MED ORDER — NITROGLYCERIN 0.4 MG SL SUBL: 0.4000 mg | SUBLINGUAL_TABLET | SUBLINGUAL | Status: DC | PRN

## 2021-04-22 MED ORDER — TRAZODONE HCL 50 MG PO TABS: 25.0000 mg | ORAL_TABLET | Freq: Every evening | ORAL | Status: DC | PRN

## 2021-04-22 MED ORDER — ONDANSETRON HCL 4 MG/2ML IJ SOLN: 4.0000 mg | Freq: Four times a day (QID) | INTRAMUSCULAR | Status: DC | PRN

## 2021-04-22 MED ORDER — SODIUM POLYSTYRENE SULFONATE 15 GM/60ML PO SUSP
15.0000 g | Freq: Once | ORAL | Status: DC
  Filled 2021-04-22: qty 60

## 2021-04-22 MED ORDER — ASPIRIN EC 81 MG PO TBEC: 81.0000 mg | DELAYED_RELEASE_TABLET | Freq: Every day | ORAL | Status: DC

## 2021-04-22 MED ORDER — DEXTROSE 50 % IV SOLN
1.0000 | Freq: Once | INTRAVENOUS | Status: AC
  Administered 2021-04-22: 50 mL via INTRAVENOUS
  Filled 2021-04-22: qty 50

## 2021-04-22 MED ORDER — GLYCOPYRROLATE 1 MG PO TABS
1.0000 mg | ORAL_TABLET | ORAL | Status: DC | PRN
  Filled 2021-04-22: qty 1

## 2021-04-22 MED ORDER — PANTOPRAZOLE SODIUM 40 MG PO TBEC: 40.0000 mg | DELAYED_RELEASE_TABLET | Freq: Every day | ORAL | Status: DC

## 2021-04-22 MED ORDER — HEPARIN BOLUS VIA INFUSION
2000.0000 [IU] | Freq: Once | INTRAVENOUS | Status: AC
Start: 2021-04-22 — End: 2021-04-22
  Administered 2021-04-22: 2000 [IU] via INTRAVENOUS
  Filled 2021-04-22: qty 2000

## 2021-04-22 MED ORDER — SODIUM POLYSTYRENE SULFONATE 15 GM/60ML PO SUSP
30.0000 g | Freq: Once | ORAL | Status: AC
  Administered 2021-04-22: 30 g via RECTAL

## 2021-04-22 MED ORDER — METRONIDAZOLE 500 MG/100ML IV SOLN
500.0000 mg | Freq: Two times a day (BID) | INTRAVENOUS | Status: DC
  Administered 2021-04-22: 500 mg via INTRAVENOUS
  Filled 2021-04-22: qty 100

## 2021-04-22 MED ORDER — LORAZEPAM 2 MG/ML IJ SOLN: 0.5000 mg | INTRAMUSCULAR | Status: DC | PRN

## 2021-04-22 MED ORDER — HYDROCODONE-ACETAMINOPHEN 5-325 MG PO TABS: 1.0000 | ORAL_TABLET | Freq: Four times a day (QID) | ORAL | Status: DC | PRN

## 2021-04-22 MED ORDER — POLYVINYL ALCOHOL 1.4 % OP SOLN
1.0000 [drp] | Freq: Four times a day (QID) | OPHTHALMIC | Status: DC | PRN
  Filled 2021-04-22: qty 15

## 2021-04-22 MED ORDER — HEPARIN BOLUS VIA INFUSION
2000.0000 [IU] | Freq: Once | INTRAVENOUS | Status: AC
  Administered 2021-04-22: 2000 [IU] via INTRAVENOUS
  Filled 2021-04-22: qty 2000

## 2021-04-22 MED ORDER — HYDROMORPHONE HCL 1 MG/ML PO LIQD
1.0000 mg | Freq: Once | ORAL | Status: DC
Start: 2021-04-22 — End: 2021-04-24

## 2021-04-22 MED ORDER — ONDANSETRON HCL 4 MG PO TABS: 4.0000 mg | ORAL_TABLET | Freq: Four times a day (QID) | ORAL | Status: DC | PRN

## 2021-04-22 MED ORDER — HYDROMORPHONE HCL 1 MG/ML IJ SOLN: 0.5000 mg | INTRAMUSCULAR | Status: DC

## 2021-04-22 MED ORDER — METOPROLOL TARTRATE 5 MG/5ML IV SOLN: 5.0000 mg | Freq: Four times a day (QID) | INTRAVENOUS | Status: DC | PRN

## 2021-04-22 MED ORDER — ATORVASTATIN CALCIUM 40 MG PO TABS: 40.0000 mg | ORAL_TABLET | Freq: Every day | ORAL | Status: DC

## 2021-04-22 MED ORDER — SODIUM BICARBONATE 8.4 % IV SOLN
50.0000 meq | Freq: Once | INTRAVENOUS | Status: AC
  Administered 2021-04-22: 50 meq via INTRAVENOUS
  Filled 2021-04-22: qty 50

## 2021-04-22 MED ORDER — INSULIN ASPART 100 UNIT/ML IJ SOLN
5.0000 [IU] | Freq: Once | INTRAMUSCULAR | Status: AC
  Administered 2021-04-22: 5 [IU] via SUBCUTANEOUS

## 2021-04-22 MED ORDER — LORAZEPAM 2 MG/ML PO CONC: 1.0000 mg | ORAL | Status: DC | PRN

## 2021-04-22 MED ORDER — BIOTENE DRY MOUTH MT LIQD
15.0000 mL | Freq: Two times a day (BID) | OROMUCOSAL | Status: DC
  Administered 2021-04-23: 15 mL via TOPICAL

## 2021-04-22 MED ORDER — LORAZEPAM 2 MG/ML IJ SOLN
1.0000 mg | INTRAMUSCULAR | Status: DC | PRN
  Administered 2021-04-22: 1 mg via INTRAVENOUS
  Filled 2021-04-22: qty 1

## 2021-04-22 MED ORDER — LORAZEPAM 1 MG PO TABS: 1.0000 mg | ORAL_TABLET | ORAL | Status: DC | PRN

## 2021-04-22 MED ORDER — DIPHENHYDRAMINE HCL 50 MG/ML IJ SOLN: 12.5000 mg | INTRAMUSCULAR | Status: DC | PRN

## 2021-04-22 MED ORDER — GLYCOPYRROLATE 0.2 MG/ML IJ SOLN
0.2000 mg | INTRAMUSCULAR | Status: DC | PRN
  Administered 2021-04-22 – 2021-04-23 (×2): 0.2 mg via INTRAVENOUS
  Filled 2021-04-22 (×4): qty 1

## 2021-04-22 MED ORDER — SODIUM BICARBONATE 8.4 % IV SOLN
INTRAVENOUS | Status: DC
  Filled 2021-04-22: qty 1000

## 2021-04-22 MED ORDER — VANCOMYCIN HCL IN DEXTROSE 1-5 GM/200ML-% IV SOLN: 1000.0000 mg | Freq: Once | INTRAVENOUS | Status: DC

## 2021-04-22 MED ORDER — GLYCOPYRROLATE 0.2 MG/ML IJ SOLN: 0.2000 mg | INTRAMUSCULAR | Status: DC | PRN

## 2021-04-22 MED ORDER — ACETAMINOPHEN 650 MG RE SUPP: 650.0000 mg | Freq: Four times a day (QID) | RECTAL | Status: DC | PRN

## 2021-04-22 MED ORDER — HALOPERIDOL 1 MG PO TABS
2.0000 mg | ORAL_TABLET | Freq: Four times a day (QID) | ORAL | Status: DC | PRN
  Filled 2021-04-22: qty 2

## 2021-04-22 MED ORDER — ONDANSETRON 4 MG PO TBDP: 4.0000 mg | ORAL_TABLET | Freq: Four times a day (QID) | ORAL | Status: DC | PRN

## 2021-04-22 MED ORDER — HYDROMORPHONE HCL 1 MG/ML IJ SOLN
0.5000 mg | INTRAMUSCULAR | Status: DC | PRN
  Administered 2021-04-23 (×4): 1 mg via INTRAVENOUS
  Filled 2021-04-22 (×4): qty 1

## 2021-04-22 MED ORDER — HEPARIN (PORCINE) 25000 UT/250ML-% IV SOLN
1350.0000 [IU]/h | INTRAVENOUS | Status: DC
  Administered 2021-04-22: 1000 [IU]/h via INTRAVENOUS
  Filled 2021-04-22: qty 250

## 2021-04-22 MED ORDER — SODIUM POLYSTYRENE SULFONATE 15 GM/60ML PO SUSP
30.0000 g | Freq: Two times a day (BID) | ORAL | Status: DC
  Filled 2021-04-22: qty 120

## 2021-04-22 MED ORDER — SACCHAROMYCES BOULARDII 250 MG PO CAPS
250.0000 mg | ORAL_CAPSULE | Freq: Two times a day (BID) | ORAL | Status: DC
  Filled 2021-04-22 (×2): qty 1

## 2021-04-22 MED ORDER — SODIUM CHLORIDE 0.9 % IV BOLUS
250.0000 mL | Freq: Once | INTRAVENOUS | Status: AC
  Administered 2021-04-22: 250 mL via INTRAVENOUS

## 2021-04-22 NOTE — Assessment & Plan Note (Signed)
-   The patient is admitted to a progressive unit bed. ?- We will continue therapy with broad coverage with IV vancomycin, cefepime and Flagyl. ?- Bronchodilator therapy mucolytic therapy will be provided. ?- We will follow blood and sputum culture. ?- We will follow pneumonia antigens. ?- Prognosis is guarded. ?

## 2021-04-22 NOTE — ED Notes (Signed)
RT at bedside, bipaped removed in favor of 5L O2 via [place]. Pt tolerating well ?

## 2021-04-22 NOTE — H&P (Addendum)
?  ?  ?[place] ? ? ?PATIENT NAME: Jamie Brennan   ? ?MR#:  [id number] ? ?DATE OF BIRTH:  1931-06-20 ? ?DATE OF ADMISSION:  04/20/2021 ? ?PRIMARY CARE PHYSICIAN: Adin Hector, MD  ? ?Patient is coming from: SNF ?REQUESTING/REFERRING PHYSICIAN: Dr. Donnetta Hail. Langston Masker ?CHIEF COMPLAINT:  ?Shortness of breath ?Altered mental status ?HISTORY OF PRESENT ILLNESS:  ?Jamie Brennan is a 88 y.o. 62-year-old Caucasian male with medical history significant for prostate cancer and osteoarthritis status post left total hip arthroplasty was recently admitted to [facility] for STEMI as well as dehydration with hyponatremia and altered mental status and closed left hip fracture.  He was discharged on 4/7 with conservative management for his STEMI with aspirin and Plavix as well as statin therapy and referral for cardiac catheterization.  Due to soft BP his ACE inhibitor was held off and his Toprol-XL was resumed.  Echo revealed an EF of 30 to 35% with anterior apical dyskinesis possibly due to his STEMI.  He was noted to be in stage IIIb chronic kidney disease with baseline creatinine of 1.7.  He had a recent IM nail on 02/21/2021 for his closed left hip fracture for which she was in rehab and went back home.  He presented to the ER today from [facility] with acute onset of dyspnea.  He was cyanotic per EMS and reportedly hypoxic.  He was placed on nonrebreather and brought to the ED.  The patient is a very poor historian due to his respiratory distress and altered mental status.  The patient had no reported chest pain, fever or chills.  No reported dysuria, oliguria or hematuria or flank pain.  No reported nausea or vomiting or abdominal pain.  His next of kin clearly indicated they do not want ICU management and wanted to maintain DNR status. ? ?ED Course: When he came to the ER temperature was 95.1 he was placed on Bair hugger, BP 184/73 for which she was placed on hydration with IV normal saline at 100 mill per hour and  respiratory rate was 24 with heart rate of 44 and later 89.  Pulse currently was 80% on 100% nonrebreather and therefore he was placed on BiPAP then tapered down to nasal cannula at 5 L/min with a pulse symmetry of 94% he was again placed on BiPAP due to respiratory distress.  Labs revealed ABG with pH 7.32, PCO2 of 20 PO2 422 and HCO3 of 12, glucose of 161 creatinine of 3.86 with a BUN of 79 calcium of 8 9 gap of 20 alk phos of 435 and albumin 2.7 with AST 508 ALT 485 with total protein of 6 . High-sensitivity troponin I was 3494 and later 30-67 with a BNP of 1687.6.  Lactic acid was 8.6 and later more than 9 and procalcitonin 2.26.  CBC showed a cytosis of 19.5 with neutrophilia and anemia better than previous levels.  INR was 2.1 with PTT of 23.1.  Potassium was initially 5.7 and later 5.4. ?EKG as reviewed by me : ..  EKG showed normal sinus rhythm with a rate of 98 with PVCs elevation and total with flattened T wave inversion inferiorly. ?Imaging: Portable chest ray showed multifocal pneumonia trace pleural effusions as well as aortic atherosclerosis. ? ?The patient was given IV cefepime and mycin as well as IV Ativan and 1 L bolus of IV normal saline.  He will be admitted to a progressive unit bed for further evaluation and management. ?PAST MEDICAL HISTORY:  ? ?  Past Medical History:  ?Diagnosis Date  ?? Prostate cancer ([place])   ? ? ?PAST SURGICAL HISTORY:  ? ?Past Surgical History:  ?Procedure Laterality Date  ?? BACK SURGERY    ?? INTRAMEDULLARY (IM) NAIL INTERTROCHANTERIC Left 02/21/2021  ? Procedure: INTRAMEDULLARY (IM) NAIL INTERTROCHANTRIC;  Surgeon: Renee Harder, MD;  Location: [facility] ORS;  Service: Orthopedics;  Laterality: Left;  ?? PROSTATECTOMY    ? ? ?SOCIAL HISTORY:  ? ?Social History  ? ?Tobacco Use  ?? Smoking status: Never  ?? Smokeless tobacco: Never  ?Substance Use Topics  ?? Alcohol use: Not Currently  ? ? ?FAMILY HISTORY:  ?No family history on file.  Unobtainable due to patient altered  mental status. ? ?DRUG ALLERGIES:  ?No Known Allergies ? ?REVIEW OF SYSTEMS:  ? ?ROS ?As per history of present illness. All pertinent systems were reviewed above. Constitutional, HEENT, cardiovascular, respiratory, GI, GU, musculoskeletal, neuro, psychiatric, endocrine, integumentary and hematologic systems were reviewed and are otherwise negative/unremarkable except for positive findings mentioned above in the HPI. ? ? ?MEDICATIONS AT HOME:  ? ?Prior to Admission medications   ?Medication Sig Start Date End Date Taking? Authorizing Provider  ?acetaminophen (TYLENOL) 500 MG tablet Take 1 tablet (500 mg total) by mouth every 6 (six) hours as needed for mild pain, moderate pain, fever or headache. 02/27/21   Eugenie Filler, MD  ?aspirin EC 81 MG EC tablet Take 1 tablet (81 mg total) by mouth daily. Swallow whole. 04/18/21   Fritzi Mandes, MD  ?atorvastatin (LIPITOR) 40 MG tablet Take 1 tablet (40 mg total) by mouth daily. 04/18/21   Fritzi Mandes, MD  ?clopidogrel (PLAVIX) 75 MG tablet Take 1 tablet (75 mg total) by mouth daily. 04/18/21   Fritzi Mandes, MD  ?docusate sodium (COLACE) 100 MG capsule Take 2 capsules (200 mg total) by mouth 2 (two) times daily. 02/27/21   Eugenie Filler, MD  ?FLORASTOR 250 MG capsule Take 250 mg by mouth 2 (two) times daily. 03/27/21   [provider]  ?HYDROcodone-acetaminophen (NORCO/VICODIN) 5-325 MG tablet Take 1-2 tablets by mouth every 6 (six) hours as needed for moderate pain. 04/17/21   Fritzi Mandes, MD  ?leptospermum manuka honey (MEDIHONEY) PSTE paste Apply 1 application. topically daily. 04/18/21   Fritzi Mandes, MD  ?metoprolol succinate (TOPROL-XL) 25 MG 24 hr tablet Take 0.5 tablets (12.5 mg total) by mouth daily. 04/18/21   Fritzi Mandes, MD  ?nitroGLYCERIN (NITROSTAT) 0.4 MG SL tablet Place 1 tablet (0.4 mg total) under the tongue every 5 (five) minutes x 3 doses as needed for chest pain. 04/17/21   Fritzi Mandes, MD  ?PROTONIX 40 MG tablet Take 40 mg by mouth daily. 03/16/21    [provider]  ?traZODone (DESYREL) 50 MG tablet Take 0.5 tablets (25 mg total) by mouth at bedtime as needed for sleep. 02/27/21   Eugenie Filler, MD  ?vitamin B-12 (CYANOCOBALAMIN) 1000 MCG tablet Take 1 tablet (1,000 mcg total) by mouth daily. 02/27/21   Eugenie Filler, MD  ? ?  ? ?VITAL SIGNS:  ?Blood pressure (!) 88/62, pulse 90, temperature (!) 95.1 ?F (35.1 ?C), temperature source Rectal, resp. rate (!) 30, SpO2 100 %. ? ?PHYSICAL EXAMINATION:  ?Physical Exam ? ?GENERAL:  88 y.o.-year-old frail Caucasian male patient lying in the bed with mild to moderate respiratory distress initially on BiPAP and later on nasal cannula with increased work of breathing.  He was fairly lethargic ?EYES: Pupils equal, round, reactive to light and accommodation. No scleral icterus.  Extraocular muscles intact.  ?HEENT: Head atraumatic, normocephalic. Oropharynx and nasopharynx clear.  ?NECK:  Supple, no jugular venous distention. No thyroid enlargement, no tenderness.  ?LUNGS: Diminished bibasilar breath sounds with bibasal crackles and accessory muscle use.   ?CARDIOVASCULAR: Regular rate and rhythm, S1, S2 normal. No murmurs, rubs, or gallops.  ?ABDOMEN: Soft, nondistended, nontender. Bowel sounds present. No organomegaly or mass.  ?EXTREMITIES: No pedal edema, cyanosis, or clubbing.  ?NEUROLOGIC: No lateralizing signs.  He was not following commands. ?PSYCHIATRIC: The patient is lethargic and globally confused with no good eye contact. ?SKIN: No obvious rash, lesion, or ulcer.  ? ?LABORATORY PANEL:  ? ?CBC ?Recent Labs  ?Lab 04/22/21 ?0405  ?WBC 26.4*  ?HGB 11.3*  ?HCT 34.8*  ?PLT 250  ? ?------------------------------------------------------------------------------------------------------------------ ? ?Chemistries  ?Recent Labs  ?Lab 04/22/21 ?0405  ?NA 136  ?K 5.9*  ?CL 106  ?CO2 12*  ?GLUCOSE 132*  ?BUN 84*  ?CREATININE 3.87*  ?CALCIUM 7.7*  ?AST 1,237*  ?ALT 860*  ?ALKPHOS 349*  ?BILITOT 1.1   ? ?------------------------------------------------------------------------------------------------------------------ ? ?Cardiac Enzymes ?No results for input(s): TROPONINI in the last 168 hours. ?--------------------------

## 2021-04-22 NOTE — ED Notes (Signed)
2 IV attempts by night shift RN and 1 attempt by this RN unsuccessful  ?

## 2021-04-22 NOTE — Progress Notes (Signed)
ANTICOAGULATION CONSULT NOTE ? ?Pharmacy Consult for Heparin ?Indication: chest pain/ACS ? ?No Known Allergies ? ?Patient Measurements: ?  ? ?Vital Signs: ?BP: 199/74 (04/12 1045) ?Pulse Rate: 90 (04/12 1015) ? ?Labs: ?Recent Labs  ?  05/05/2021 ?2139 04/22/21 ?0003 04/22/21 ?9371 04/22/21 ?0405 04/22/21 ?0912 04/22/21 ?1245  ?HGB 11.9* 11.2*  --  11.3*  --   --   ?HCT 37.9* 33.0*  --  34.8*  --   --   ?PLT 318  --   --  250  --   --   ?APTT  --   --  33  --   --   --   ?LABPROT  --   --  23.1* 23.0*  --   --   ?INR  --   --  2.1* 2.1*  --   --   ?HEPARINUNFRC  --   --   --   --   --  <0.10*  ?CREATININE 3.86*  --   --  3.87* 4.19*  --   ?TROPONINIHS 6,967*  --  3,267*  --   --   --   ? ?Estimated Creatinine Clearance: 10.3 mL/min (A) (by C-G formula based on SCr of 4.19 mg/dL (H)).  ? ?Assessment: ?88 y.o. male with presented to the ED with possible ACS. His initial heparin level is undetectable. No bleeding noted.  ? ?Goal of Therapy:  ?Heparin level 0.3-0.7 units/ml ?Monitor platelets by anticoagulation protocol: Yes ?  ?Plan:  ?Bolus heparin 2000 units IV x 1 ?Increase heparin gtt to 1350 units/hr (recently therapeutic on this rate) ?Check an 8 hr heparin level ?Daily heparin level and CBC ? ?Broady Lafoy, Rande Lawman ?04/22/2021,1:47 PM ? ? ?

## 2021-04-22 NOTE — Progress Notes (Addendum)
Agree with plan of care as per Dr. Gayla Medicus who saw this patient and admitted him at around 730 this morning ?Additionally ? ?88 year old [place] resident known prostate cancer left hip arthroplasty 02/2021  ?referred over from facility secondary to cyanosis hypoxia in ED found to have Tmax 95 BP 106/64 respiratory rate 30s ?Developed hypoxia placed on BiPAP--emergency room work-up showed on chest x-ray  ? ?multifocal pneumonia with suggestion of septic shock lactic acidosis of 8-9, WBC 26 ?Also found to have metabolic acidosis with CO2 of 12 potassium 5.9 anion gap of 18 AST/ALT 1237/860 bilirubin 1.1, INR 2.1 ? ?[Recent hospitalization 3/31 through 4/7 acute STEMI troponin >24,000-Rx conservatively heparin GTT placed on aspirin Plavix statin but ACE discontinued because of low blood pressure Toprol-XL was continued] ? ? ? ? ?BP 97/63   Pulse 88   Temp (!) 95.1 ?F (35.1 ?C) (Rectal)   Resp (!) 28   SpO2 100%  ? ?FiO2 (%):  [40 %] 40 % currently on BiPAP 10/5 ? ?Ill-appearing white male in significant cardiopulmonary distress ?Pupils are pinpoint he is reactive however is breathing about 30 times a minute on my exam ?Tachycardic seems to be sinus tach with incomplete right bundle on monitors ?Abdomen is soft no rebound no guarding ?Heels show bilateral chronic wounds after removal of the Prevalon boots with denudation of the skin but no overt oozing-he does have chronic ulcers ?No lower extremity edema ?Mentation is confused he is able to orient minimally as he is on BiPAP 10/5 ?Cannot appreciate any abnormalities given BiPAP on oropharynx neck is soft supple ?Neurology exam is deferred at this time ? ? ?P ?Supportive measures confirmed with family per admitting physician ?Keep on BiPAP--changed all meds to p.o. if needed May need to hold metoprolol for blood pressure less then 90 systolic, MAP less than 65 ?We will give rectal ASA given recent which was medically managed STEMI ?Give rectal Kayexalate  milligrams once  ? ?Cycle troponins 2 more times, cycle lactic acid and repeat EKG in the next 4 to 6 hours--- repeat bmet for hyperkalemia and acidosis [given calcium gluconate at 645 this morning] ? ?If he worsens we will have a further discussion with family--- he is orientable minimally at this time and will need to be reassessed later on this morning ? ? ?

## 2021-04-22 NOTE — Progress Notes (Signed)
ANTICOAGULATION CONSULT NOTE - Initial Consult ? ?Pharmacy Consult for Heparin ?Indication: chest pain/ACS ? ?No Known Allergies ? ?Patient Measurements: ?  ? ?Vital Signs: ?Temp: 95.1 ?F (35.1 ?C) (04/11 2141) ?Temp Source: Rectal (04/11 2141) ?BP: 95/79 (04/12 0200) ?Pulse Rate: 88 (04/12 0200) ? ?Labs: ?Recent Labs  ?  05/02/2021 ?2139 04/22/21 ?0003 04/22/21 ?5277  ?HGB 11.9* 11.2*  --   ?HCT 37.9* 33.0*  --   ?PLT 318  --   --   ?APTT  --   --  33  ?LABPROT  --   --  23.1*  ?INR  --   --  2.1*  ?CREATININE 3.86*  --   --   ?TROPONINIHS 8,242*  --  3,267*  ? ? ?Estimated Creatinine Clearance: 11.2 mL/min (A) (by C-G formula based on SCr of 3.86 mg/dL (H)). ? ? ?Medical History: ?Past Medical History:  ?Diagnosis Date  ? Prostate cancer ([place])   ? ? ?Medications:  ?No current facility-administered medications on file prior to encounter.  ? ?Current Outpatient Medications on File Prior to Encounter  ?Medication Sig Dispense Refill  ? acetaminophen (TYLENOL) 500 MG tablet Take 1 tablet (500 mg total) by mouth every 6 (six) hours as needed for mild pain, moderate pain, fever or headache. 30 tablet 0  ? aspirin EC 81 MG EC tablet Take 1 tablet (81 mg total) by mouth daily. Swallow whole. 30 tablet 11  ? atorvastatin (LIPITOR) 40 MG tablet Take 1 tablet (40 mg total) by mouth daily. 30 tablet 0  ? clopidogrel (PLAVIX) 75 MG tablet Take 1 tablet (75 mg total) by mouth daily. 30 tablet 1  ? docusate sodium (COLACE) 100 MG capsule Take 2 capsules (200 mg total) by mouth 2 (two) times daily. 10 capsule 0  ? FLORASTOR 250 MG capsule Take 250 mg by mouth 2 (two) times daily.    ? HYDROcodone-acetaminophen (NORCO/VICODIN) 5-325 MG tablet Take 1-2 tablets by mouth every 6 (six) hours as needed for moderate pain. 10 tablet 0  ? leptospermum manuka honey (MEDIHONEY) PSTE paste Apply 1 application. topically daily. 44 mL 0  ? metoprolol succinate (TOPROL-XL) 25 MG 24 hr tablet Take 0.5 tablets (12.5 mg total) by mouth daily. 30  tablet 1  ? nitroGLYCERIN (NITROSTAT) 0.4 MG SL tablet Place 1 tablet (0.4 mg total) under the tongue every 5 (five) minutes x 3 doses as needed for chest pain. 20 tablet 12  ? PROTONIX 40 MG tablet Take 40 mg by mouth daily.    ? traZODone (DESYREL) 50 MG tablet Take 0.5 tablets (25 mg total) by mouth at bedtime as needed for sleep. 10 tablet 0  ? vitamin B-12 (CYANOCOBALAMIN) 1000 MCG tablet Take 1 tablet (1,000 mcg total) by mouth daily.    ?  ? ?Assessment: ?88 y.o. male with elevated troponin, possible ACS, for heparin ? ?Goal of Therapy:  ?Heparin level 0.3-0.7 units/ml ?Monitor platelets by anticoagulation protocol: Yes ?  ?Plan:  ?Heparin 2000 units IV bolus, then start heparin 1000 units/hr ?Check heparin level in 8 hours.    ? ?Jamie Brennan, Bronson Curb ?04/22/2021,3:31 AM ? ? ?

## 2021-04-22 NOTE — Assessment & Plan Note (Signed)
-   This is the source of his sepsis and septic shock. ?- Antibiotic therapy will be provided as mentioned above. ?

## 2021-04-22 NOTE — Assessment & Plan Note (Signed)
-   She has initially required BiPAP and later was tapered to nasal cannula however due to increased work of breathing he was placed back on BiPAP. ?- We will follow his O2 sats. ?

## 2021-04-22 NOTE — Progress Notes (Signed)
Patient reviewed this p.m. at around 4 PM-he is comfortable off of BiPAP but somewhat confused ?I had spoken with his niece earlier today who understands the gravity of the situation-I was later informed that they have chosen to pursue a comfort trajectory and patient can be admitted to any MedSurg unit that can do palliative care-I do think that he may be able to go to a freestanding facility in the next 24 to 48 hours if a bed is available and hospice liaison has been contacted by social work ? ?Appreciative of coordination of care by the team ? ?Verneita Griffes, MD ?[facility] ?6:25 PM ? ?

## 2021-04-22 NOTE — ED Notes (Signed)
IV in right forearm noted to be bleeding. Pt unintentionally removed IV.  ?

## 2021-04-22 NOTE — Assessment & Plan Note (Addendum)
-   This is part of the manifestation of multisystem organ failure. ?- It is associated with metabolic acidosis. ?- It was ordered IV bicarbonate drip ?- We will follow renal functions. ?- It is associated with hyperkalemia that is being managed. ?

## 2021-04-22 NOTE — Progress Notes (Signed)
?   04/22/21 1621  ?Clinical Encounter Type  ?Visited With Patient  ?Visit Type Spiritual support;Initial  ?Referral From Nurse  ?Consult/Referral To Chaplain  ? ?Chaplain responded to a call from [place] with palliative care. Family was requesting prayer for the patient, Jamie Brennan. I spoke with Lynann Bologna for a few minutes who shared with me a desire to go home and to see his family. Koal presented as calm and content as I prayed.  ? ?Danice Goltz  ?Chaplain  ?[facility]  ?[phone] ?

## 2021-04-22 NOTE — Assessment & Plan Note (Addendum)
-   This is associated with liver cell failure manifested by coagulopathy and elevated LFTs. ?- We will continue aggressive hydration and monitor LFTs. ?- This significantly worsened his prognosis. ?- His next of kin is aware. ?

## 2021-04-22 NOTE — Consult Note (Addendum)
?Consultation Note ?Date: 04/22/2021  ? ?Patient Name: Jamie Brennan  ?DOB: 1931/03/13  MRN: [id number]  Age / Sex: 88 y.o., male  ?PCP: Jamie Hector, MD ?Referring Physician: Nita Sells, MD ? ?Reason for Consultation: Establishing goals of care ? ?HPI/Patient Profile: 88 y.o. male  with past medical history of prostate cancer and osteoarthritis status post left total hip arthroplasty presented to the ED on 04/22/2021 from [facility] with complaints of shortness of breath.  Per EMS patient was cyanotic and hypoxic although they were not able to get a good O2 saturation due to poor perfusion -he was put on nonrebreather and brought to the ED.  Of note, patient was recently admitted at [facility] from 3/31- 04/17/2021 for STEMI, dehydration, hyponatremia, altered mental status, and closed left hip fracture.   Patient was admitted on 04/16/2021 with septic shock, multifocal pneumonia, acute respiratory failure with hypoxia, acute MI, AKI on CKD, shock liver. ? ? ?Clinical Assessment and Goals of Care: ?I have reviewed medical records including EPIC notes, labs, and imaging. Received report from primary RN - no acute concerns.  Reports patient is able to follow commands but is impulsive. ? ?Went to visit patient at bedside - no family/visitors present. Patient was lying in bed awake, alert, oriented x1 to self only, and able to participate in minimal conversation due to Bipap in place. No signs or non-verbal gestures of pain or discomfort noted. No respiratory distress, increased work of breathing, or secretions noted.  Patient denies pain or shortness of breath.  He does tell me he wants BiPAP to be removed.  I asked him if he would want [place] to continue life-prolonging measures such as BiPAP versus focusing on keeping him comfortable with hospice-he clearly states he would want to be kept "comfortable with hospice."  I told him this  option may shorten his life expectancy he states "that is okay." ? ?MOST form was found at bedside -copy was made. ? ?Met with niece/Jamie to discuss diagnosis, prognosis, GOC, EOL wishes, disposition, and options. ? ?I introduced Palliative Medicine as specialized medical care for people living with serious illness. It focuses on providing relief from the symptoms and stress of a serious illness. The goal is to improve quality of life for both the patient and the family. ? ?We discussed a brief life review of the patient as well as functional and nutritional status.  Patient's wife unfortunately passed away 4 years ago -they had no children.  His closest next of kin is Jamie Brennan.  Patient was previously living in [place].  In August 2022 patient was diagnosed with COVID after having an unwitnessed fall outside where he was down for 4 hours before being found.  At that time Jamie Brennan moved patient to [place] to live with her because he was not stable/strong enough to return home alone.  He has been living with Jamie Brennan since September 2022.  She reports he was "doing well with PT" until February 2023.  At this time he fell and broke his hip, required surgery and a hospital stay x1 week.  He was discharged to [facility] where he remained for 2 weeks.  He returned to live with niece for 2 weeks.  At the end of March 2023 patient became increasingly weak and confused and he was admitted to [facility] on 3/31 with STEMI.  He was discharged on 04/17/2021 to [facility].  Jamie recognizes patient has had a gradual decline since last year when he had COVID.  She reports his appetite as being "good" but does note he recently has not been eating a lot. Albumin was noted to be 2.5 on 04/22/2021.  ? ?We discussed patient's current illness and what it means in the larger context of patient's on-going co-morbidities.  Jamie Brennan has a clear understanding of patient's current acute medical situation.  We discussed his lab work,  multiorgan system failure, malnutrition in context of his continued gradual decline.  Reviewed conversation had with patient as outlined above where he clearly stated he would wish to be kept comfortable.  Natural disease trajectory and expectations at EOL were discussed. I attempted to elicit values and goals of care important to the patient. The difference between aggressive medical intervention and comfort care was considered in light of the patient's goals of care.  ? ?We talked about transition to comfort measures in house and what that would entail inclusive of medications to control pain, dyspnea, agitation, nausea, and itching. We discussed stopping all unnecessary measures such as blood draws, needle sticks, oxygen, antibiotics, CBGs/insulin, cardiac monitoring, IVF, and frequent vital signs.  Jamie Brennan is agreeable for transition to full comfort care today.  Jamie Brennan states she will notify family.  Encouraged family to visit sooner than later as patient is at high risk for decline -she expressed understanding. ? ?Jamie Brennan is familiar with hospice services from utilizing them with a previous family member.  She is hopeful patient can be transferred to [facility] in [place] to be closer to family.  Discussed patient may become too unstable for transfer; at that point we may recommend patient remain in-house for end-of-life care, but for now can initiate referral to get patient on wait lists-Jamie is agreeable. ? ?Advance directives, concepts specific to code status, artificial feeding and hydration, and rehospitalization were considered and discussed.  Patient does not have a living will.  Jamie Brennan states he does have a POA but is unsure if it is specifically for healthcare -she states the form lists herself as a Media planner.  She requests chaplain visit with patient to offer prayer -patient is of [place]. ? ?Visit also consisted of discussions dealing with the complex and emotionally  intense issues of symptom management and palliative care in the setting of serious and potentially life-threatening illness. Palliative care team will continue to support patient, patient's family, and medical team. ? ?Discussed with patient/family the importance of continued conversation with each other and the medical providers regarding overall plan of care and treatment options, ensuring decisions are within the context of the patient?s values and GOCs.   ? ?Questions and concerns were addressed. The patient/family was encouraged to call with questions and/or concerns. PMT number was provided. ? ?Spoke with RN about patient's toleration off BiPAP this afternoon.  She states patient did well, did not have respiratory distress, only replaced BiPAP due to aspiration event after eating and having difficulty breathing after that event with increased work of breathing.  Family are hopeful patient can have as many wakeful moments as possible with them -we will see how he does with scheduled Dilaudid over Dilaudid drip at this time. ? ?Chaplain paged and notified of family's request for prayer/support for patient. ? ?5:30 PM ?RN states patient is doing well off bipap without dilaudid. Will discontinue scheduled dilaudid and leave PRN. ? ? ?Primary Decision Maker: ?NEXT OF KIN -niece/Jamie Brennan ?  ? ?SUMMARY OF RECOMMENDATIONS   ?Initiated full comfort measures ?Continue DNR/DNI as previously documented ?Family is hopeful for patient transfer to  [facility] in [place] - per liaison no beds today. Patient at high risk for decline, may become hospital death ?Added orders for EOL symptom management and to reflect full comfort measures, as well as discontinued orders that were not focused on comfort ?Unrestricted visitation orders were placed per current [place] EOL visitation policy  ?Nursing to provide frequent assessments and administer PRN medications as clinically necessary to ensure EOL  comfort ?PMT will continue to follow and support holistically ? ?Symptom Management ?Family are planning to visit and are hopeful they can have as many wakeful moments with him as possible. Patient has done well

## 2021-04-22 NOTE — Assessment & Plan Note (Signed)
-   This is manifested by remarkably elevated troponin I. ?- Differential diagnosis would being significant demand ischemia from septic shock however the patient did have a recent STEMI that was conservatively managed. ?- IV heparin was ordered I will monitor his coagulopathy and liver functions. ?

## 2021-04-22 NOTE — Progress Notes (Signed)
Pt constantly pulling off BIPAP mask. RT informed RN we will give pt a break from BIPAP. Pt placed on [place] 5L. RT will monitor.  ?

## 2021-04-22 NOTE — ED Notes (Signed)
Medications sent up by pharm was given to nurse on 6N, shannon brown ?

## 2021-04-22 NOTE — ED Notes (Signed)
Verbal order from MD to hold all PO meds since pt is on bipap. Also to hold ativan unless needed. MD also adjusted medication orders.  ?

## 2021-04-22 NOTE — ED Notes (Signed)
Pt dentures removed and placed in cup at beside with pt label on it ?

## 2021-04-22 NOTE — ED Notes (Signed)
Bipap removed

## 2021-04-23 DIAGNOSIS — R6520 Severe sepsis without septic shock: Secondary | ICD-10-CM | POA: Diagnosis not present

## 2021-04-23 DIAGNOSIS — J9601 Acute respiratory failure with hypoxia: Secondary | ICD-10-CM | POA: Diagnosis not present

## 2021-04-23 DIAGNOSIS — A419 Sepsis, unspecified organism: Secondary | ICD-10-CM | POA: Diagnosis not present

## 2021-04-23 DIAGNOSIS — I219 Acute myocardial infarction, unspecified: Secondary | ICD-10-CM | POA: Diagnosis not present

## 2021-04-23 DIAGNOSIS — R6521 Severe sepsis with septic shock: Secondary | ICD-10-CM | POA: Diagnosis not present

## 2021-04-23 DIAGNOSIS — R0602 Shortness of breath: Secondary | ICD-10-CM

## 2021-04-23 DIAGNOSIS — N179 Acute kidney failure, unspecified: Secondary | ICD-10-CM | POA: Diagnosis not present

## 2021-04-23 MED ORDER — HYDROMORPHONE HCL 1 MG/ML IJ SOLN
1.0000 mg | INTRAMUSCULAR | Status: DC
  Administered 2021-04-23 (×2): 1 mg via INTRAVENOUS
  Filled 2021-04-23 (×2): qty 1

## 2021-04-26 LAB — CULTURE, BLOOD (ROUTINE X 2): Culture: NO GROWTH

## 2021-05-11 NOTE — Progress Notes (Signed)
Follow-up note ? ?88 year old [place] resident known prostate cancer left hip arthroplasty 02/2021  ?referred over from facility secondary to cyanosis hypoxia in ED found to have Tmax 95 BP 106/64 respiratory rate 30s ?Developed hypoxia placed on BiPAP--emergency room work-up showed on chest x-ray  ? ?multifocal pneumonia with suggestion of septic shock lactic acidosis of 8-9, WBC 26 ?Also found to have metabolic acidosis with CO2 of 12 potassium 5.9 anion gap of 18 AST/ALT 1237/860 bilirubin 1.1, INR 2.1 ? ?[Recent hospitalization 3/31 through 4/7 acute STEMI troponin >24,000-Rx conservatively heparin GTT placed on aspirin Plavix statin but ACE discontinued because of low blood pressure Toprol-XL was continued] ? ?Problem list ?Septic shock with MODS on admission secondary to multifocal pneumonia ?Acute kidney injury with hyperkalemia and severe electrolyte disturbances ?Recent acute STEMI ? ? ?S ? ?Unresponsive ? ? ?O ?BP (!) 85/60 (BP Location: Left Arm)   Pulse 90   Temp 97.8 ?F (36.6 ?C) (Axillary)   Resp 20   SpO2 96% ' ?Patient appears ill with same deep respirations and periods of apnea at the bedside when I see him-mucosas very dry ?His feet are slightly cooler to touch than his hands ? ? ?A ?The goal of the patient's care at this time is now comfort related ?I do not think based on me reviewing the patient today he will survive beyond 48 hours ? ?P ?Expect hospital death but if he surprises [place] may, be able to go to hospice facility in a.m. ? ? ?

## 2021-05-11 NOTE — TOC Progression Note (Signed)
Transition of Care (TOC) - Progression Note  ? ? ?Patient Details  ?Name: Jamie Brennan ?MRN: [id number] ?Date of Birth: 1931-04-01 ? ?Transition of Care (TOC) CM/SW Contact  ?Verdell Carmine, RN ?Phone Number: ?29-Apr-2021, 10:23 AM ? ?Clinical Narrative:    ? ?Palliative care following patient and spoke with NOK, Debbie, whom he lives with ( niece) Plan is to go to residential hospice in [place]. No beds at present ?TOC will continue to follow for needs, recommendations, and transitions.  ?Expected Discharge Plan: [place] (residential hospice [place]) ?  ? ?Expected Discharge Plan and Services ?Expected Discharge Plan: [place] (residential hospice [place]) ?  ?  ?  ?  ?                ?  ?  ?  ?  ?  ?  ?  ?  ?  ?  ? ? ?Social Determinants of Health (SDOH) Interventions ?  ? ?Readmission Risk Interventions ?   ? View : No data to display.  ?  ?  ?  ? ? ?

## 2021-05-11 NOTE — Progress Notes (Addendum)
Manufacturing engineer [facility]) Hospital Liaison Note ? ?Referral received for patient/family interest in [facility].  ? ?Hospice eligibility confirmed. ? ?Unfortunately, Hospice Home is unable to offer a room today. Hospital Liaison will follow up tomorrow or sooner if a room becomes available. .    ? ?Please call with any questions or concerns. Thank you ? ?Roselee Nova, LCSW ?[facility] Liaison ?[phone] ?

## 2021-05-11 NOTE — Discharge Summary (Signed)
?Death Summary  ?Jamie Brennan WER:[id number] DOB: 22-Jun-1931 DOA: 04/15/2021 ? ?PCP: Jamie Hector, MD ? ?Admit date: April 23, 2021 ?Date of Death: 05-08-2021 ?Time of Death: 11:52 PM ?Notification: Jamie Hector, MD notified of death of 05/08/2021 ? ? ?History of present illness:  ? ?88 year old Jamie Brennan resident known prostate cancer left hip arthroplasty 02/2021  ?referred over from facility secondary to cyanosis hypoxia in ED found to have Tmax 95 BP 106/64 respiratory rate 30s ?Developed hypoxia placed on BiPAP--emergency room work-up showed on chest x-ray  ?  ?multifocal pneumonia with septic shock lactic acidosis of 8-9, WBC 26 ?Also found to have metabolic acidosis with CO2 of 12 potassium 5.9 anion gap of 18 AST/ALT 1237/860 bilirubin 1.1, INR 2.1 ?  ?[Recent hospitalization 3/31 through 4/7 acute STEMI troponin >24,000-Rx conservatively heparin GTT placed on aspirin Plavix statin but ACE discontinued because of low blood pressure Toprol-XL was continued] ? ?Patient accepted by hospitalist service and as he was significantly obtunded and had a poor prognosis --palliative care saw the patient in consult ?He was initially placed on BiPAP but continued to be confused-I discussed personally with his family goals of care in detail and his niece understood that he had an overall grave prognosis ?Chaplain was called and visit with them ?Palliative care as well as myself consolidated goals of care  ? ?ultimately was felt that with his gap acidosis severe pneumonia Hypoxia as well as recent STEMI with multiorgan dysfunction syndrome, he was probably unstable to transfer as his blood pressures dropped on 04-26-2022 and he had deep sighing inspirations and periods of apnea ? ?Ultimately he succumbed to his illness 04-25-2021, time of death at around 9 PM ? ?Final Diagnoses:  ?1.   Multiorgan dysfunction syndrome secondary to severe sepsis from multifocal pneumonia ? ? ?The results of significant diagnostics from  this hospitalization (including imaging, microbiology, ancillary and laboratory) are listed below for reference.   ? ?Significant Diagnostic Studies: ?CT Head Wo Contrast ? ?Result Date: 04/10/2021 ?CLINICAL DATA:  Generalized weakness EXAM: CT HEAD WITHOUT CONTRAST TECHNIQUE: Contiguous axial images were obtained from the base of the skull through the vertex without intravenous contrast. RADIATION DOSE REDUCTION: This exam was performed according to the departmental dose-optimization program which includes automated exposure control, adjustment of the mA and/or kV according to patient size and/or use of iterative reconstruction technique. COMPARISON:  CT head 02/20/2021 FINDINGS: Brain: There is no evidence of acute intracranial hemorrhage, extra-axial fluid collection, or acute infarct. There is moderate global parenchymal volume loss with prominence of the ventricular system and extra-axial CSF spaces, unchanged. The ventricular system is stable in size and configuration. Gray-white differentiation is preserved. Patchy hypodensity in the subcortical and periventricular white matter likely reflects sequela of chronic white matter microangiopathy. There are small remote lacunar infarcts in the left basal ganglia/insula, unchanged. There is no mass lesion.  There is no mass effect or midline shift. Vascular: No hyperdense vessel or unexpected calcification. Skull: Normal. Negative for fracture or focal lesion. Sinuses/Orbits: Imaged paranasal sinuses are clear. Bilateral lens implants are in place. The globes and orbits are otherwise unremarkable. Other: None. IMPRESSION: No acute intracranial pathology. Electronically Signed   By: Jamie Brennan M.D.   On: 04/10/2021 09:20  ? ?DG Chest Port 1 View ? ?Result Date: 05/08/2021 ?CLINICAL DATA:  Questionable sepsis - evaluate for abnormality EXAM: PORTABLE CHEST 1 VIEW COMPARISON:  Chest x-ray 04/10/2021 FINDINGS: The heart and mediastinal contours are unchanged. Aortic  calcification. Almost complete  opacification of the right upper lobe. Patchy airspace and interstitial opacity within the remainder of the lungs. No pulmonary edema. At least bilateral trace pleural effusions. No pneumothorax. No acute osseous abnormality. . IMPRESSION: 1. Multifocal pneumonia. Followup PA and lateral chest X-ray is recommended in 3-4 weeks following therapy to ensure resolution. 2.  At least bilateral trace pleural effusions. 3.  Aortic Atherosclerosis (ICD10-I70.0). Electronically Signed   By: Jamie Brennan M.D.   On: 04/16/2021 22:12  ? ?DG Chest Portable 1 View ? ?Result Date: 04/10/2021 ?CLINICAL DATA:  Weakness. EXAM: PORTABLE CHEST 1 VIEW COMPARISON:  03/28/2021 FINDINGS: The cardiomediastinal silhouette is unchanged with normal heart size. Lung volumes are low with mild opacity in both lung bases. No sizable pleural effusion or pneumothorax is identified. No acute osseous abnormality is seen. IMPRESSION: Low lung volumes with bibasilar opacities, likely atelectasis. Electronically Signed   By: Jamie Brennan M.D.   On: 04/10/2021 07:42  ? ?ECHOCARDIOGRAM COMPLETE ? ?Result Date: 04/10/2021 ?   ECHOCARDIOGRAM REPORT   Patient Name:   Jamie Brennan Date of Exam: 04/10/2021 Medical Rec #:  [id number]          Height:       69.0 in Accession #:    [id number]         Weight:       114.6 lb Date of Birth:  08/05/1931          BSA:          1.631 m? Patient Age:    78 years           BP:           144/78 mmHg Patient Gender: M                  HR:           96 bpm. Exam Location:  [facility] Procedure: 2D Echo, Cardiac Doppler and Color Doppler Indications:     Acute myocardial infarction, unspecified I21.9  History:         Patient has no prior history of Echocardiogram examinations.                  Acute ST elevation myocardial infarction (STEMI).  Sonographer:     Jamie Brennan RDCS Sonographer#2:   Jamie Brennan Referring Phys:  [place] Diagnosing Phys: Jamie Cowman MD  IMPRESSIONS  1. Left ventricular ejection fraction, by estimation, is 30 to 35%. The left ventricle has moderately decreased function. The left ventricle demonstrates regional wall motion abnormalities (see scoring diagram/findings for description). Left ventricular  diastolic parameters are consistent with Grade I diastolic dysfunction (impaired relaxation).  2. Right ventricular systolic function is normal. The right ventricular size is normal.  3. The mitral valve is normal in structure. Mild to moderate mitral valve regurgitation. No evidence of mitral stenosis.  4. The aortic valve is normal in structure. Aortic valve regurgitation is not visualized. No aortic stenosis is present.  5. The inferior vena cava is normal in size with greater than 50% respiratory variability, suggesting right atrial pressure of 3 mmHg. FINDINGS  Left Ventricle: Left ventricular ejection fraction, by estimation, is 30 to 35%. The left ventricle has moderately decreased function. The left ventricle demonstrates regional wall motion abnormalities. The left ventricular internal cavity size was normal in size. There is no left ventricular hypertrophy. Left ventricular diastolic parameters are consistent with Grade I diastolic dysfunction (impaired relaxation).  LV Wall Scoring: The apical lateral  segment, apical septal segment, and apex are akinetic. Right Ventricle: The right ventricular size is normal. No increase in right ventricular wall thickness. Right ventricular systolic function is normal. Left Atrium: Left atrial size was normal in size. Right Atrium: Right atrial size was normal in size. Pericardium: There is no evidence of pericardial effusion. Mitral Valve: The mitral valve is normal in structure. Mild to moderate mitral valve regurgitation. No evidence of mitral valve stenosis. Tricuspid Valve: The tricuspid valve is normal in structure. Tricuspid valve regurgitation is mild . No evidence of tricuspid stenosis. Aortic  Valve: The aortic valve is normal in structure. Aortic valve regurgitation is not visualized. No aortic stenosis is present. Pulmonic Valve: The pulmonic valve was normal in structure. Pulmonic valve regurgitation is no

## 2021-05-11 NOTE — Progress Notes (Signed)
Bladder scan perform. Amount 216 ml.  ?

## 2021-05-11 NOTE — Progress Notes (Signed)
? ? ?  OVERNIGHT PROGRESS REPORT ? ?Notified by RN that patient has expired at 2250 ? ?Patient was DNR/comfort care. ? ?2 RN verified. ? ?Family was not immediately available to RN, but contact was made by RN with contact Donald Prose. ? ? ? ? ?Gershon Cull MSNA ACNPC-AG ?Acute Care Nurse Practitioner ?[facility] ?[place] ? ?

## 2021-05-11 NOTE — Progress Notes (Addendum)
?                                                                                                                                                     ?                                                   ?Daily Progress Note  ? ?Patient Name: Jamie Brennan       Date: April 26, 2021 ?DOB: May 21, 1931  Age: 88 y.o. MRN#: [id number] ?Attending Physician: Nita Sells, MD ?Primary Care Physician: Adin Hector, MD ?Admit Date: 05/07/2021 ? ?Reason for Consultation/Follow-up: Non pain symptom management, Pain control, Psychosocial/spiritual support, and Terminal Care ? ?Subjective: ?Chart review performed. Received report from primary RN - no acute concerns.  ? ?Went to visit patient at bedside - no family/visitors present. Patient was lying in bed asleep - I did not attempt to wake him. No signs or non-verbal gestures of pain or discomfort noted. No respiratory distress or secretions;  increased work of breathing noted. Requested RN provided PRN dose of dilaudid. ? ?Spoke with [facility] liaison - there will likely be no beds available again today.  ? ?Spoke with niece/Debbie. Provided updates from my and RN assessment as above. Reviewed no bed availability at hospice today. She expresses appreciation for updates today. ? ?All questions and concerns addressed. Encouraged to call with questions and/or concerns. PMT number previously provided. ? ? ?4:45 PM ?Chart reviewed - noted frequent administration of PRN dilaudid (about every three hours) over the course of the day. Spoke with primary RN - will scheduled dilaudid q3h and keep PRN doses for breakthrough symptoms. ? ?Length of Stay: 1 ? ?Current Medications: ?Scheduled Meds:  ?? antiseptic oral rinse  15 mL Topical BID  ?? HYDROmorphone HCl  1 mg Oral Once  ? ? ?Continuous Infusions: ? ? ?PRN Meds: ?acetaminophen **OR** acetaminophen, diphenhydrAMINE, glycopyrrolate **OR** glycopyrrolate **OR** glycopyrrolate, haloperidol **OR** haloperidol **OR** haloperidol  lactate, HYDROmorphone (DILAUDID) injection **OR** HYDROmorphone HCl, LORazepam **OR** LORazepam **OR** LORazepam, nitroGLYCERIN, ondansetron **OR** ondansetron (ZOFRAN) IV, polyvinyl alcohol ? ?Physical Exam ?Vitals and nursing note reviewed.  ?Constitutional:   ?   General: He is not in acute distress. ?   Appearance: He is ill-appearing.  ?Pulmonary:  ?   Effort: No respiratory distress.  ?   Comments: Increased work of breathing ?Skin: ?   General: Skin is warm and dry.  ?Neurological:  ?   Mental Status: He is unresponsive.  ?   Motor: Weakness present.  ?         ? ?Vital Signs: BP (!) 85/60 (BP Location: Left Arm)   Pulse 90   Temp 97.8 ?  F (36.6 ?C) (Axillary)   Resp 20   SpO2 96%  ?SpO2: SpO2: 96 % ?O2 Device: O2 Device: Room Air ?O2 Flow Rate: O2 Flow Rate (L/min): 5 L/min ? ?Intake/output summary:  ?Intake/Output Summary (Last 24 hours) at 05-17-21 0857 ?Last data filed at 2021-05-17 0000 ?Gross per 24 hour  ?Intake 392.41 ml  ?Output --  ?Net 392.41 ml  ? ?LBM:   ?Baseline Weight:   ?Most recent weight:   ? ?     ?Palliative Assessment/Data: PPS 10% ? ? ? ? ? ?Patient Active Problem List  ? Diagnosis Date Noted  ?? Septic shock ([place]) 04/22/2021  ?? Shock liver 04/22/2021  ?? Acute myocardial infarction ([place]) 04/22/2021  ?? Multifocal pneumonia 04/22/2021  ?? Acute kidney injury superimposed on chronic kidney disease ([place]) 04/22/2021  ?? Acute respiratory failure with hypoxia ([place]) 04/22/2021  ?? Stage 3b chronic kidney disease (CKD) ([place]) 04/11/2021  ?? Acute systolic CHF (congestive heart failure) ([place]) 04/11/2021  ?? Acute ST elevation myocardial infarction (STEMI) ([place]) 04/10/2021  ?? Dehydration with hyponatremia 04/10/2021  ?? AMS (altered mental status) 04/10/2021  ?? Acute postoperative anemia due to expected blood loss 02/25/2021  ?? Stage 3a chronic kidney disease (CKD) ([place]) 02/25/2021  ?? Vitamin B12 deficiency 02/25/2021  ?? Closed left hip fracture ([place]) 02/20/2021  ? ? ?Palliative Care  Assessment & Plan  ? ?Patient Profile: ?88 y.o. male  with past medical history of prostate cancer and osteoarthritis status post left total hip arthroplasty presented to the ED on 04/22/2021 from [facility] with complaints of shortness of breath.  Per EMS patient was cyanotic and hypoxic although they were not able to get a good O2 saturation due to poor perfusion -he was put on nonrebreather and brought to the ED.  Of note, patient was recently admitted at [facility] from 3/31- 04/17/2021 for STEMI, dehydration, hyponatremia, altered mental status, and closed left hip fracture.   Patient was admitted on 04/13/2021 with septic shock, multifocal pneumonia, acute respiratory failure with hypoxia, acute MI, AKI on CKD, shock liver. ? ?Assessment: ?Principal Problem: ?  Septic shock ([place]) ?Active Problems: ?  Shock liver ?  Acute myocardial infarction [facility]) ?  Multifocal pneumonia ?  Acute kidney injury superimposed on chronic kidney disease ([place]) ?  Acute respiratory failure with hypoxia ([place]) ?  Terminal care ? ?Recommendations/Plan: ?Continue full comfort measures ?Continue DNR/DNI as previously documented ?Transfer to [facility] in [place] when bed available - per liaison no beds today. If bed is not available soon, may become hospital death ?Started scheduled dilaudid q3h. Continue PRN doses for breakthrough symptoms. If requiring more frequent doses than q3h recommend initiating dilaudid drip ?PMT will continue to follow and support holistically ? ?Goals of Care and Additional Recommendations: ?Limitations on Scope of Treatment: Full Comfort Care ? ?Code Status: ? ?  ?Code Status Orders  ?(From admission, onward)  ?  ? ? ?  ? ?  Start     Ordered  ? 04/22/21 1608  Do not attempt resuscitation (DNR)  Continuous       ?Question Answer Comment  ?In the event of cardiac or respiratory ARREST Do not call a ?code blue?   ?In the event of cardiac or respiratory ARREST Do not perform Intubation, CPR,  defibrillation or ACLS   ?In the event of cardiac or respiratory ARREST Use medication by any route, position, wound care, and other measures to relive pain and suffering. May use oxygen, suction and manual treatment of  airway obstruction as needed for comfort.   ?Comments Discussed code status with niece and he is a DNR   ?  ? 04/22/21 1617  ? ?  ?  ? ?  ? ?Code Status History   ? ? Date Active Date Inactive Code Status Order ID Comments User Context  ? 04/22/2021 0323 04/22/2021 1617 DNR [id number]  Christel Mormon, MD ED  ? 04/10/2021 0917 04/17/2021 1731 DNR [id number]  Collier Bullock, MD ED  ? 02/21/2021 0247 02/27/2021 2311 DNR [id number]  Sidney Ace, Arvella Merles, MD Inpatient  ? 02/20/2021 2107 02/21/2021 0247 Full Code [id number]  Mansy, Arvella Merles, MD ED  ? ?  ? ? ?Prognosis: ? Hours - Days ? ?Discharge Planning: ?Hospice facility vs hospital death ? ?Care plan was discussed with primary RN, patient's family, [facility] liaison, TOC, Dr. Verlon Au ? ?Thank you for allowing the Palliative Medicine Team to assist in the care of this patient. ? ?Lin Landsman, NP ? ?Please contact Palliative Medicine Team phone at [phone] for questions and concerns.  ? ?*Portions of this note are a verbal dictation therefore any spelling and/or grammatical errors are due to the "[employer]" system interpretation. ? ?

## 2021-05-11 NOTE — Progress Notes (Signed)
This chaplain responded to the family's request for Pt. Prayer. Family is not at the bedside at the time of the visit.  ? ?The chaplain sat beside the Pt. and reflected on the beauty of the day outside the Pt. window and God's everlasting love. The chaplain shared the Lord's Prayer with the Pt. along with a blessing. ? ?This chaplain is available for F/U spiritual care as needed. ? ?Chaplain Sallyanne Kuster ?[phone] ?

## 2021-05-11 DEATH — deceased

## 2021-06-11 NOTE — Progress Notes (Signed)
?  Progress Note  ? ?Date: 05/22/2021 ? ?Patient Name: Jamie Brennan        ?MRN#: [id number] ? ?Review the patient?s clinical findings supports the diagnosis of:  ? ?Hyponatremia  ? ? ? ? ?

## 2021-06-11 NOTE — Progress Notes (Signed)
?  Progress Note  ? ?Date: 05/22/2021 ? ?Patient Name: Jamie Brennan        ?MRN#: [id number] ? ?Review the patient?s clinical findings supports the diagnosis of:  ? ?Hyperglycemia  ? ? ? ? ?

## 2022-12-08 IMAGING — CT CT CERVICAL SPINE W/O CM
3 of 4 series · 12 of 33 positions shown, 14 images · non-contrast
Comparison: None.

CLINICAL DATA: Neck trauma (Age >= 65y); Head trauma, minor (Age >=
65y)



[Series 4: sagittal bone · sagittal · 0.26mm/px · 5 of 52 slices shown, 6 images]
[im 18/52  bone]
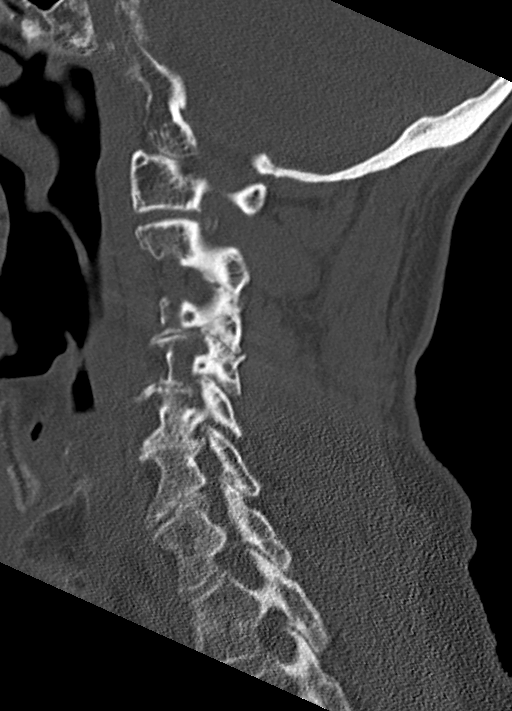
[im 22/52  bone]
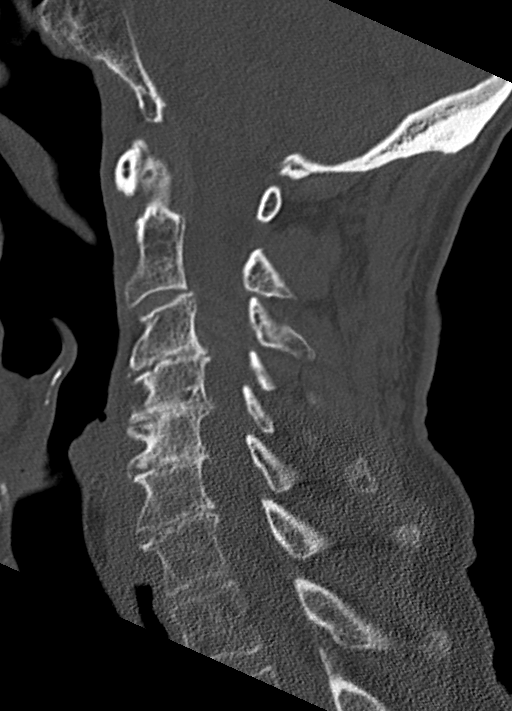
[im 26/52  soft-tissue]
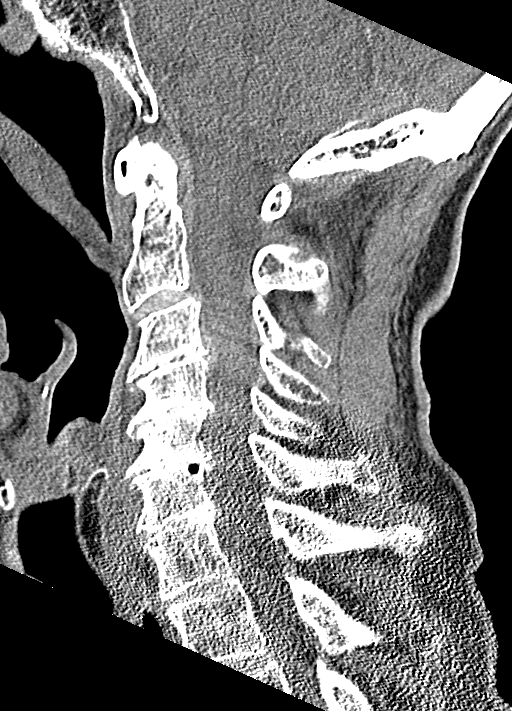
[im 26/52  bone]
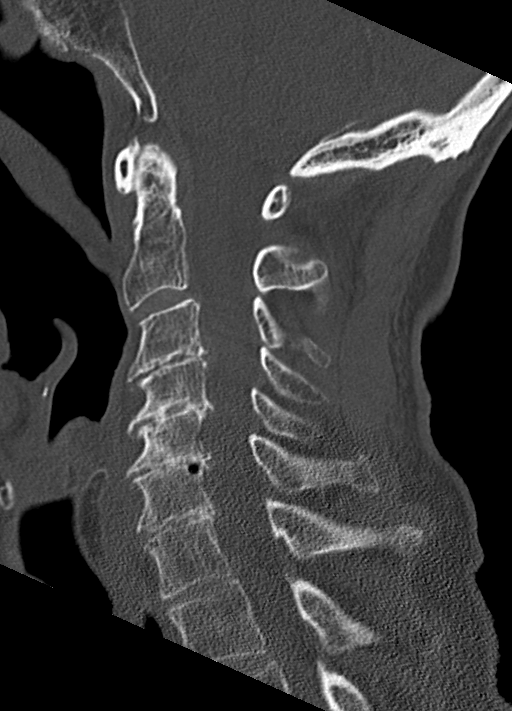
[im 30/52  bone]
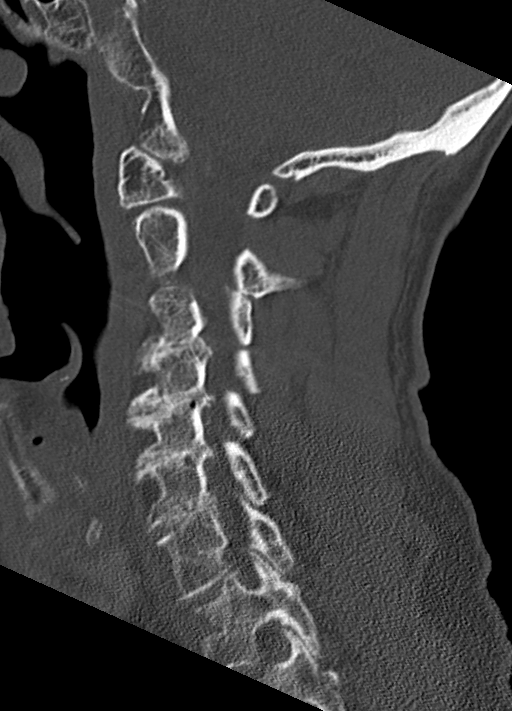
[im 35/52  bone]
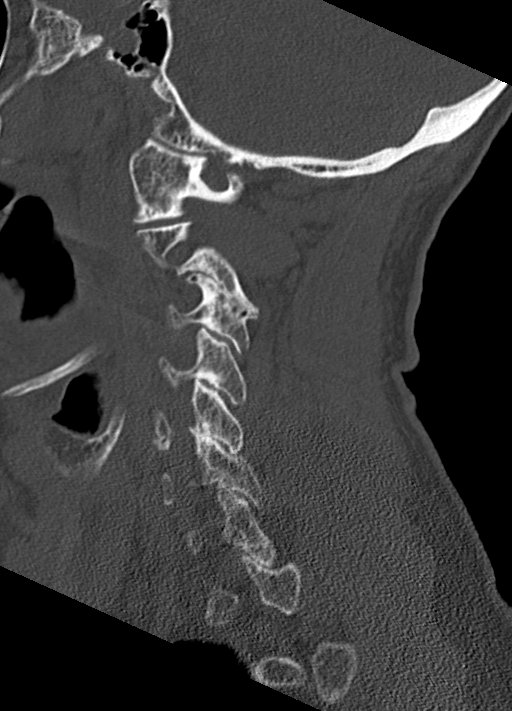

[Series 5: coronal bone · coronal · 0.25mm/px · 3 of 54 slices shown]
[im 11/54  bone]
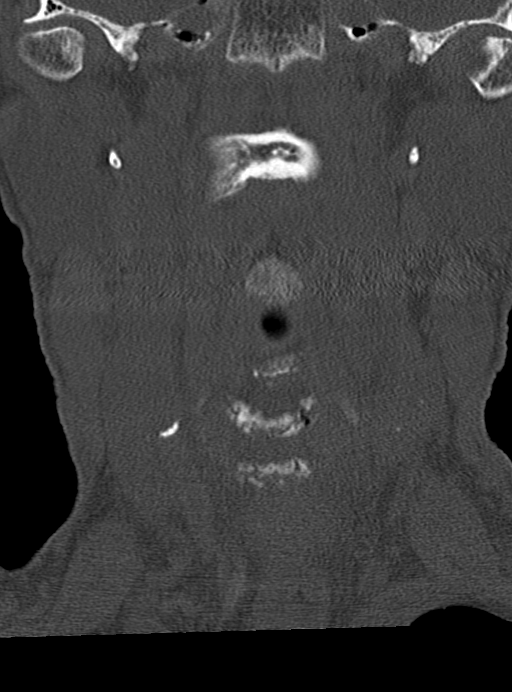
[im 22/54  bone]
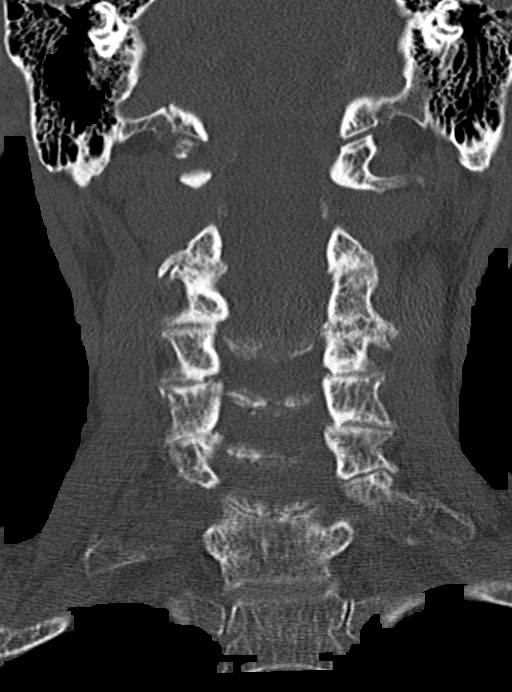
[im 32/54  bone]
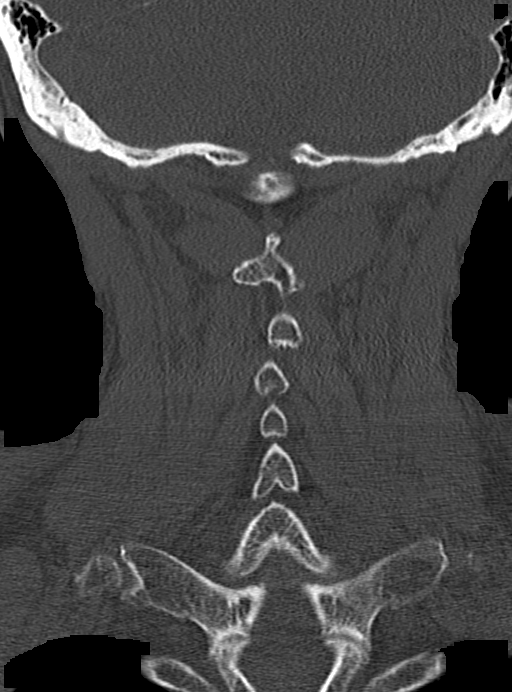

[Series 6: orthogonal bone · axial · 0.26mm/px · z∈[-309,-209]mm · 4 of 87 slices shown, 5 images]
[im 15/87  soft-tissue]
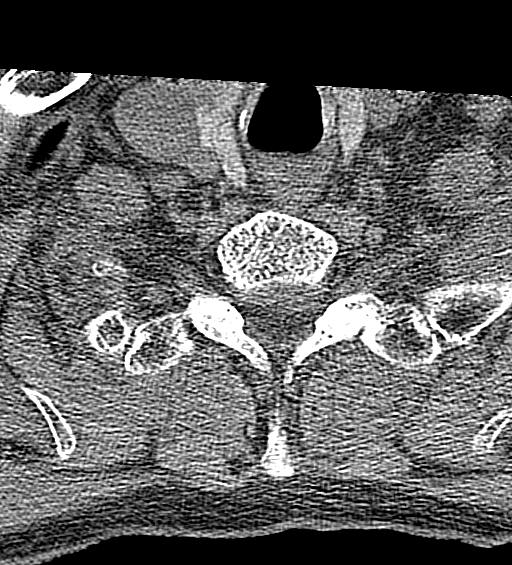
[im 15/87  bone]
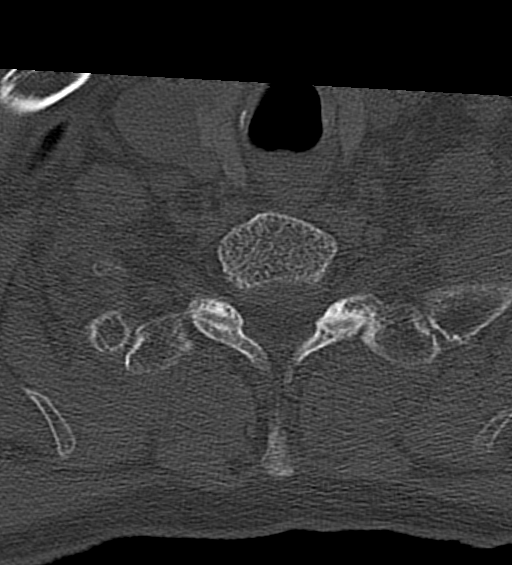
[im 29/87  bone]
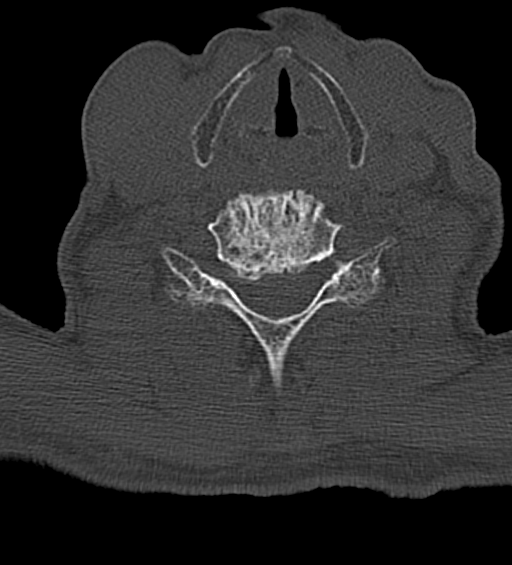
[im 58/87  bone]
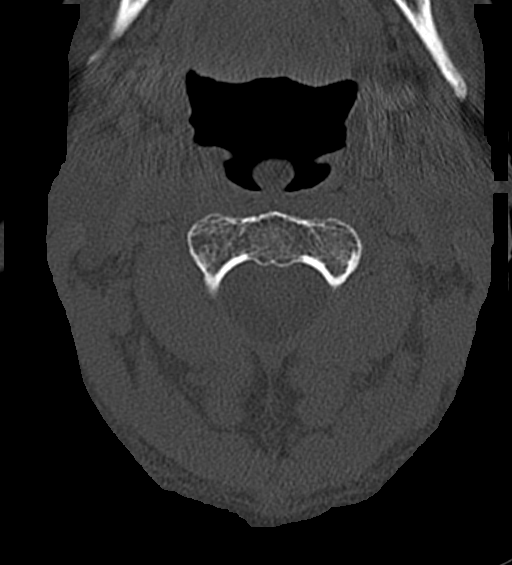
[im 72/87  bone]
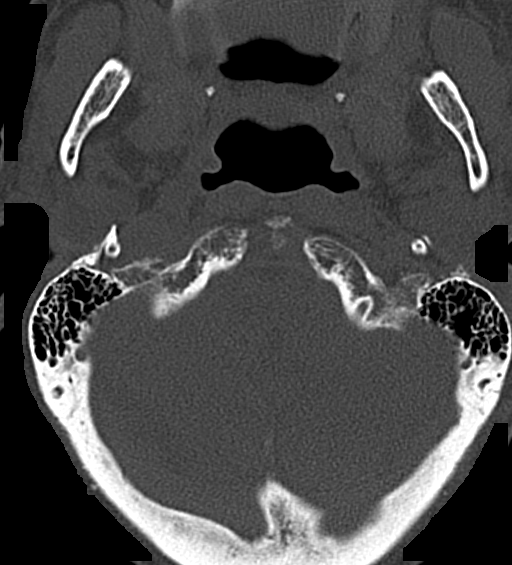

[12 of 33 positions shown; findings below may reference images not displayed]

FINDINGS: CT HEAD FINDINGS

BRAIN:
BRAIN
Prominence of the lateral ventricles may be related to central
predominant atrophy, although a component of normal
pressure/communicating hydrocephalus cannot be excluded. Patchy and
confluent areas of decreased attenuation are noted throughout the
deep and periventricular white matter of the cerebral hemispheres
bilaterally, compatible with chronic microvascular ischemic disease.

No evidence of large-territorial acute infarction. No parenchymal
hemorrhage. No mass lesion. No extra-axial collection.

No mass effect or midline shift. No hydrocephalus. Basilar cisterns
are patent.

Vascular: No hyperdense vessel.

Skull: No acute fracture or focal lesion.

Sinuses/Orbits: Paranasal sinuses and mastoid air cells are clear.
Bilateral lens replacement. Otherwise the orbits are unremarkable.

Other: None.

CT CERVICAL SPINE FINDINGS

Alignment: Normal.

Skull base and vertebrae: Multilevel moderate to severe degenerative
changes of the spine. Associated multilevel severe osseous neural
foraminal stenosis. No severe osseous central canal stenosis. No
acute fracture. No aggressive appearing focal osseous lesion or
focal pathologic process.

Soft tissues and spinal canal: No prevertebral fluid or swelling. No
visible canal hematoma.

Upper chest: Unremarkable.

Other: None.
IMPRESSION: 1. No acute intracranial abnormality.
2. Prominence of the lateral ventricles may be related to central
predominant atrophy, although a component of normal
pressure/communicating hydrocephalus cannot be excluded.
3. No acute displaced fracture or traumatic listhesis of the
cervical spine.
4. Multilevel moderate to severe degenerative changes of the spine.
Associated multilevel severe osseous neural foraminal stenosis.

## 2022-12-09 IMAGING — XA DG HIP (WITH OR WITHOUT PELVIS) 2-3V*L*
1 series · 3 of 3 positions shown · non-contrast
Comparison: CT pelvis dated 02/20/2021

Radiation Exposure Index (as provided by the fluoroscopic device):
5.2 mGy Kerma

CLINICAL DATA: Left hip femoral nail

EXAM:
DG HIP (WITH OR WITHOUT PELVIS) 2-3V LEFT

[Series 1: dg x-ray · 0.20mm/px · 3 of 3 slices shown]
[im 1/3]
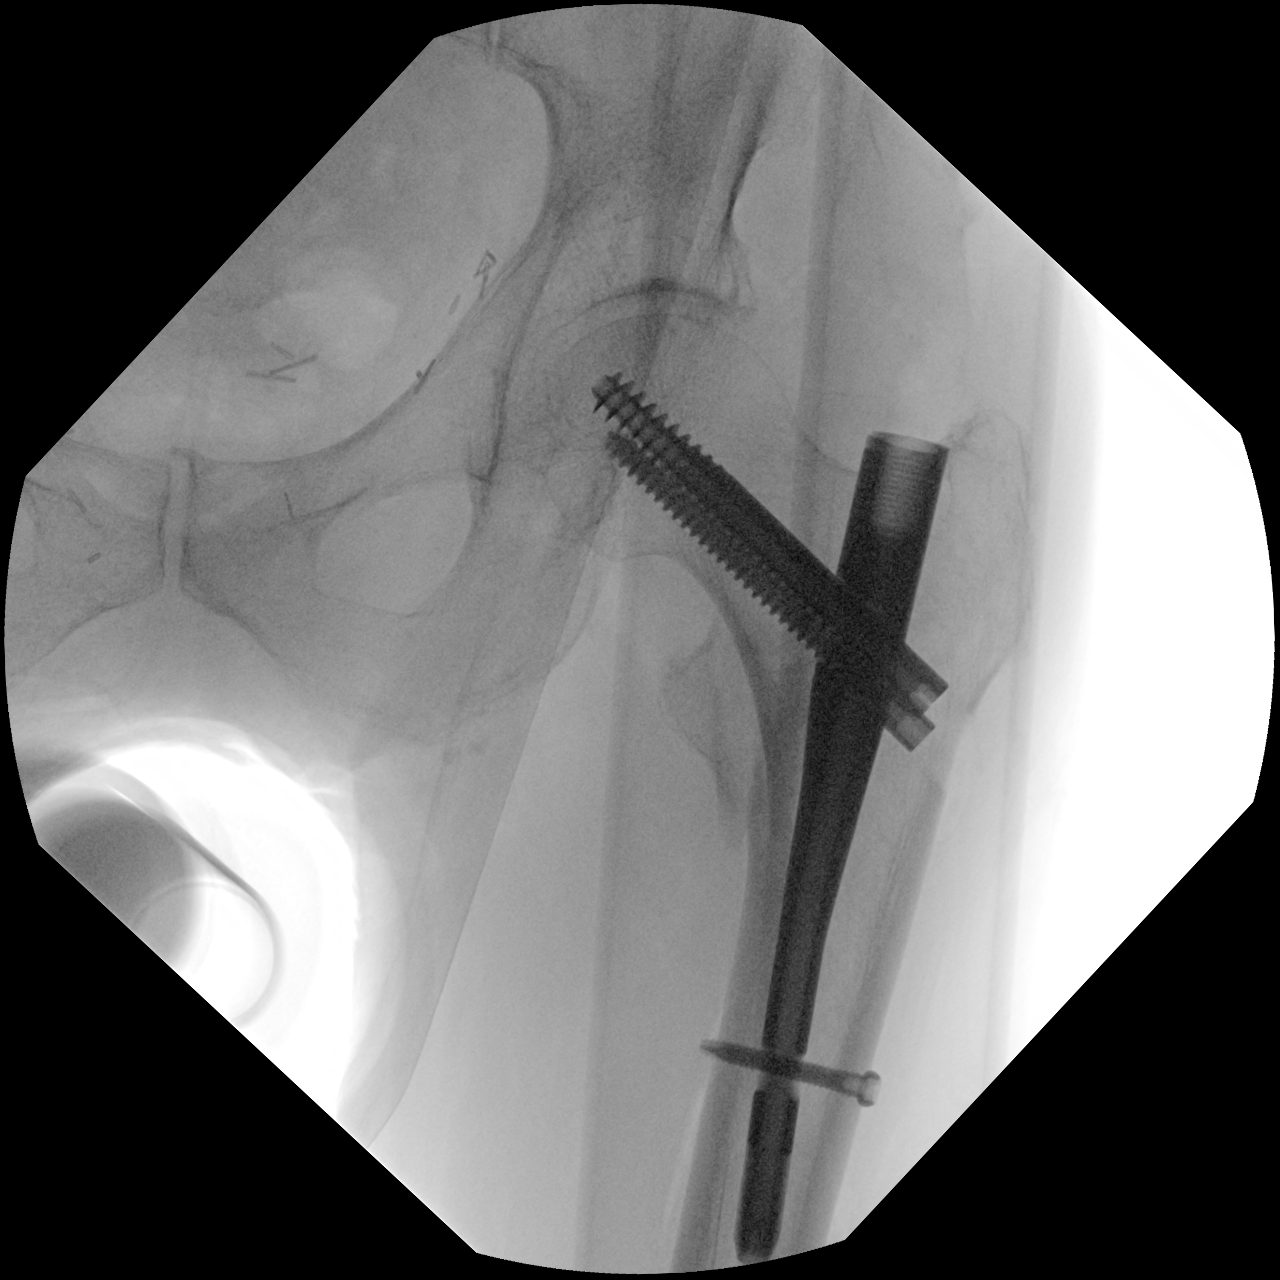
[im 2/3]
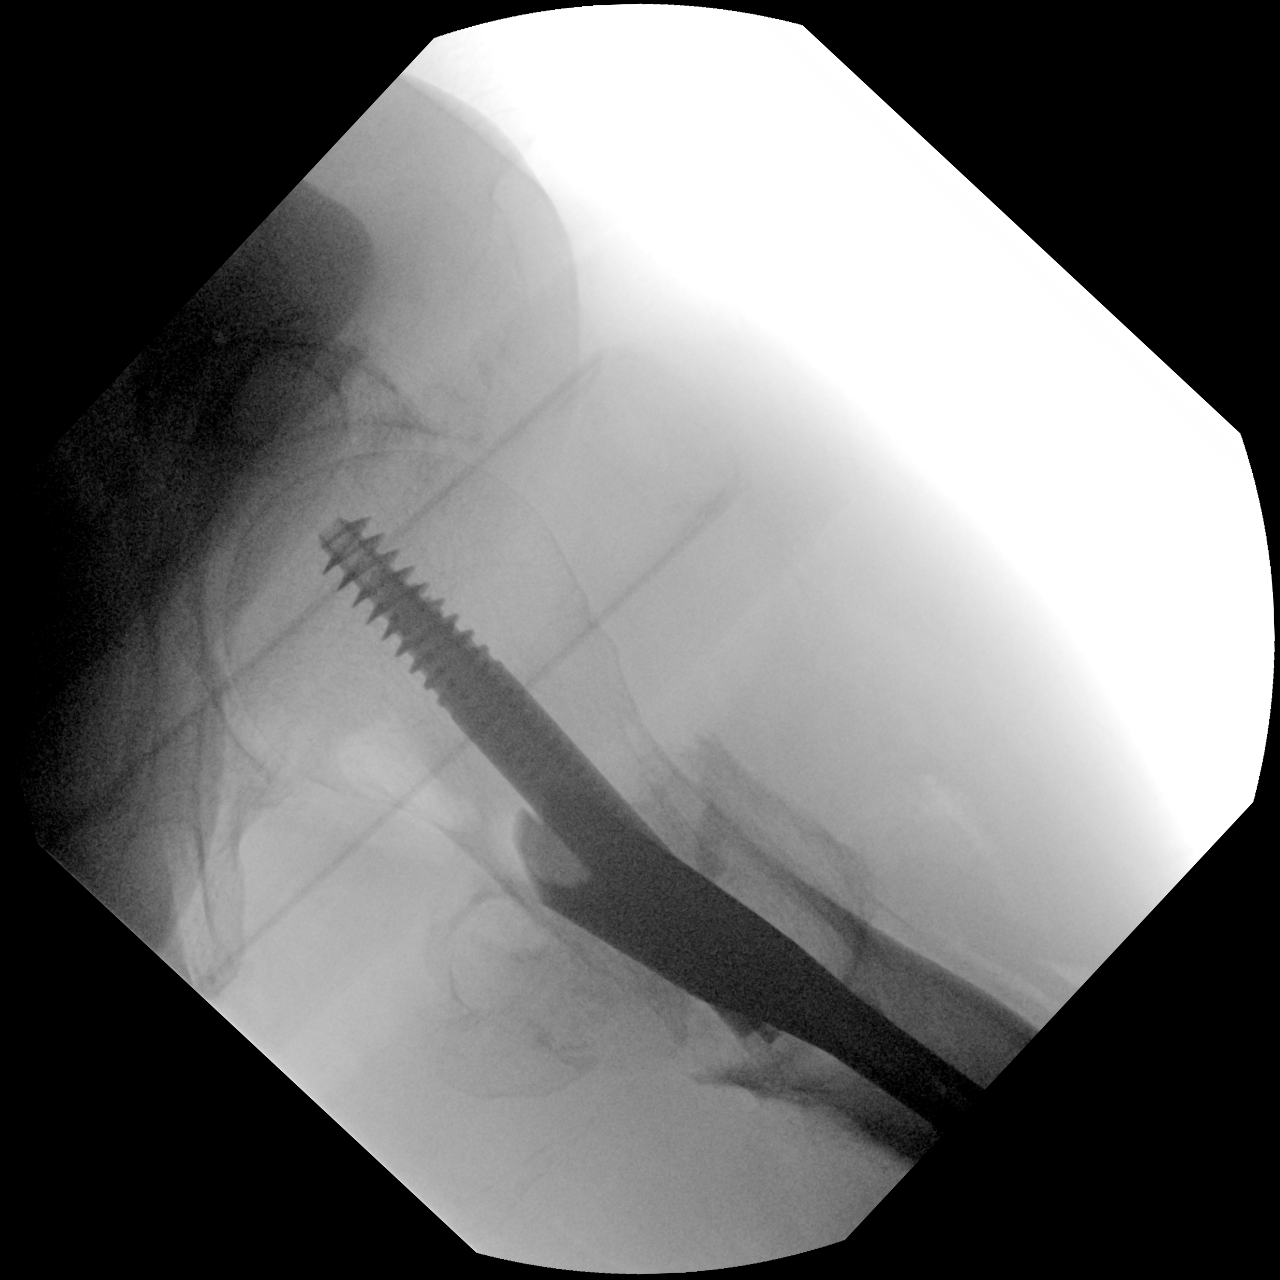
[im 3/3]
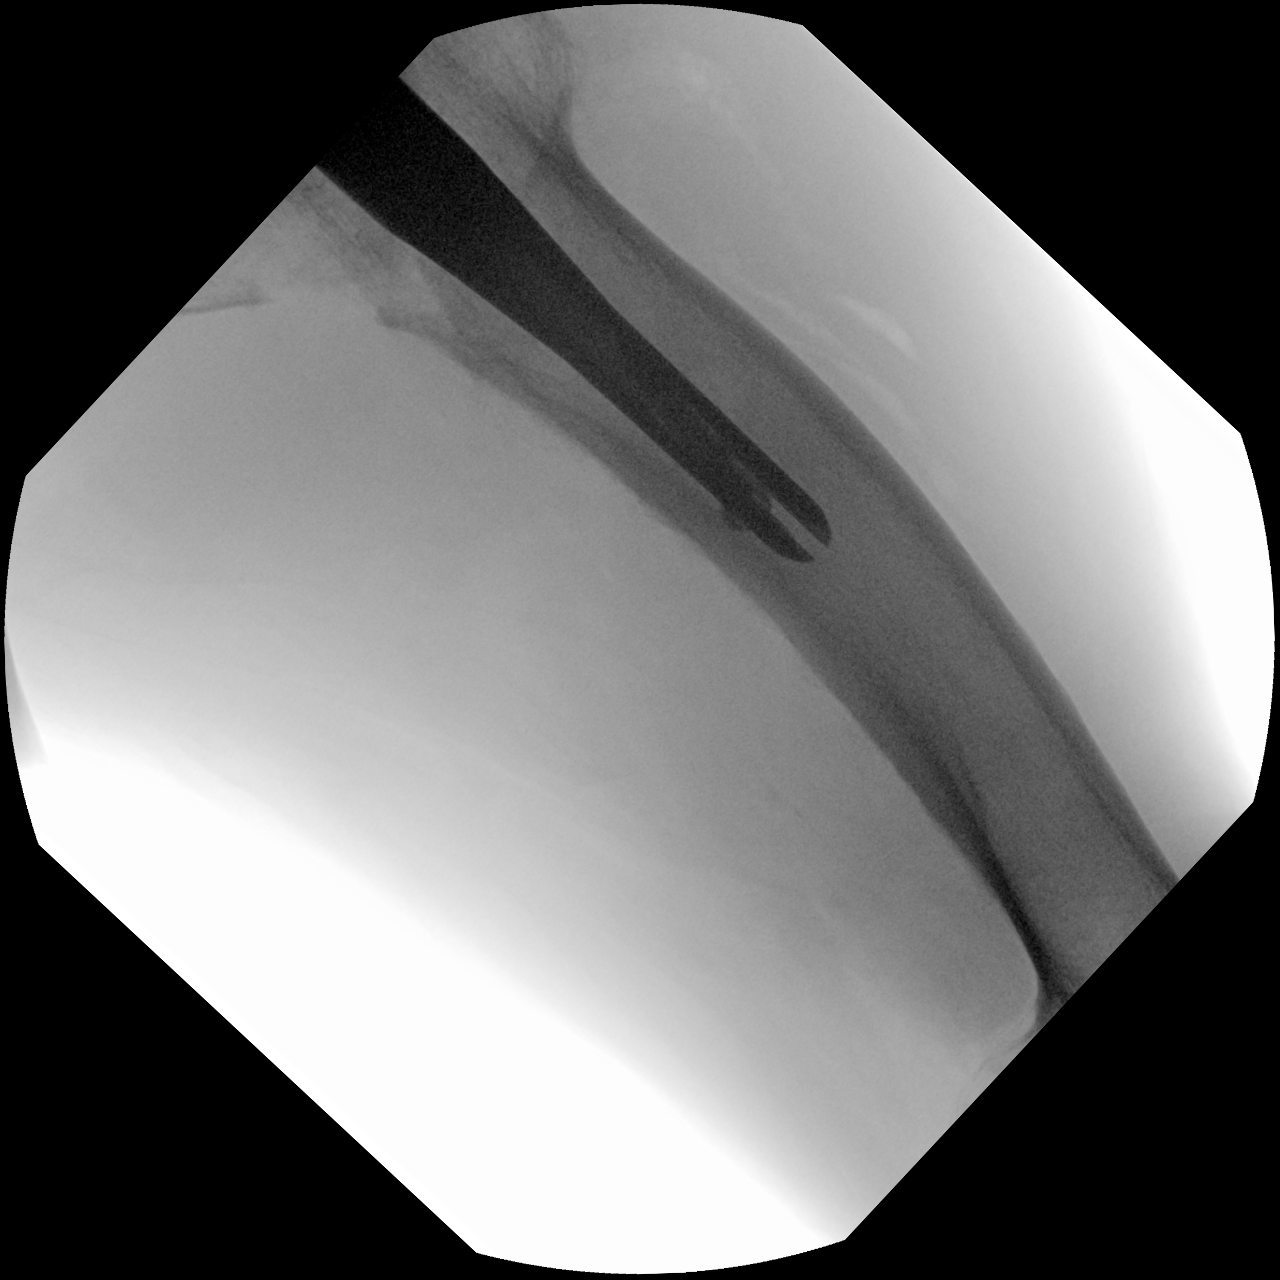

[3 of 3 positions shown; findings below may reference images not displayed]

FINDINGS: Intraoperative fluoroscopic radiographs during IM nail with dynamic
hip screw fixation of an intertrochanteric left hip fracture.
IMPRESSION: Status post ORIF of the left hip, as above.

## 2023-02-06 IMAGING — DX DG CHEST 1V PORT
1 series · 1 of 1 positions shown · non-contrast
Comparison: Chest x-ray 04/10/2021

CLINICAL DATA: Questionable sepsis - evaluate for abnormality

EXAM:
PORTABLE CHEST 1 VIEW

[chest ap]
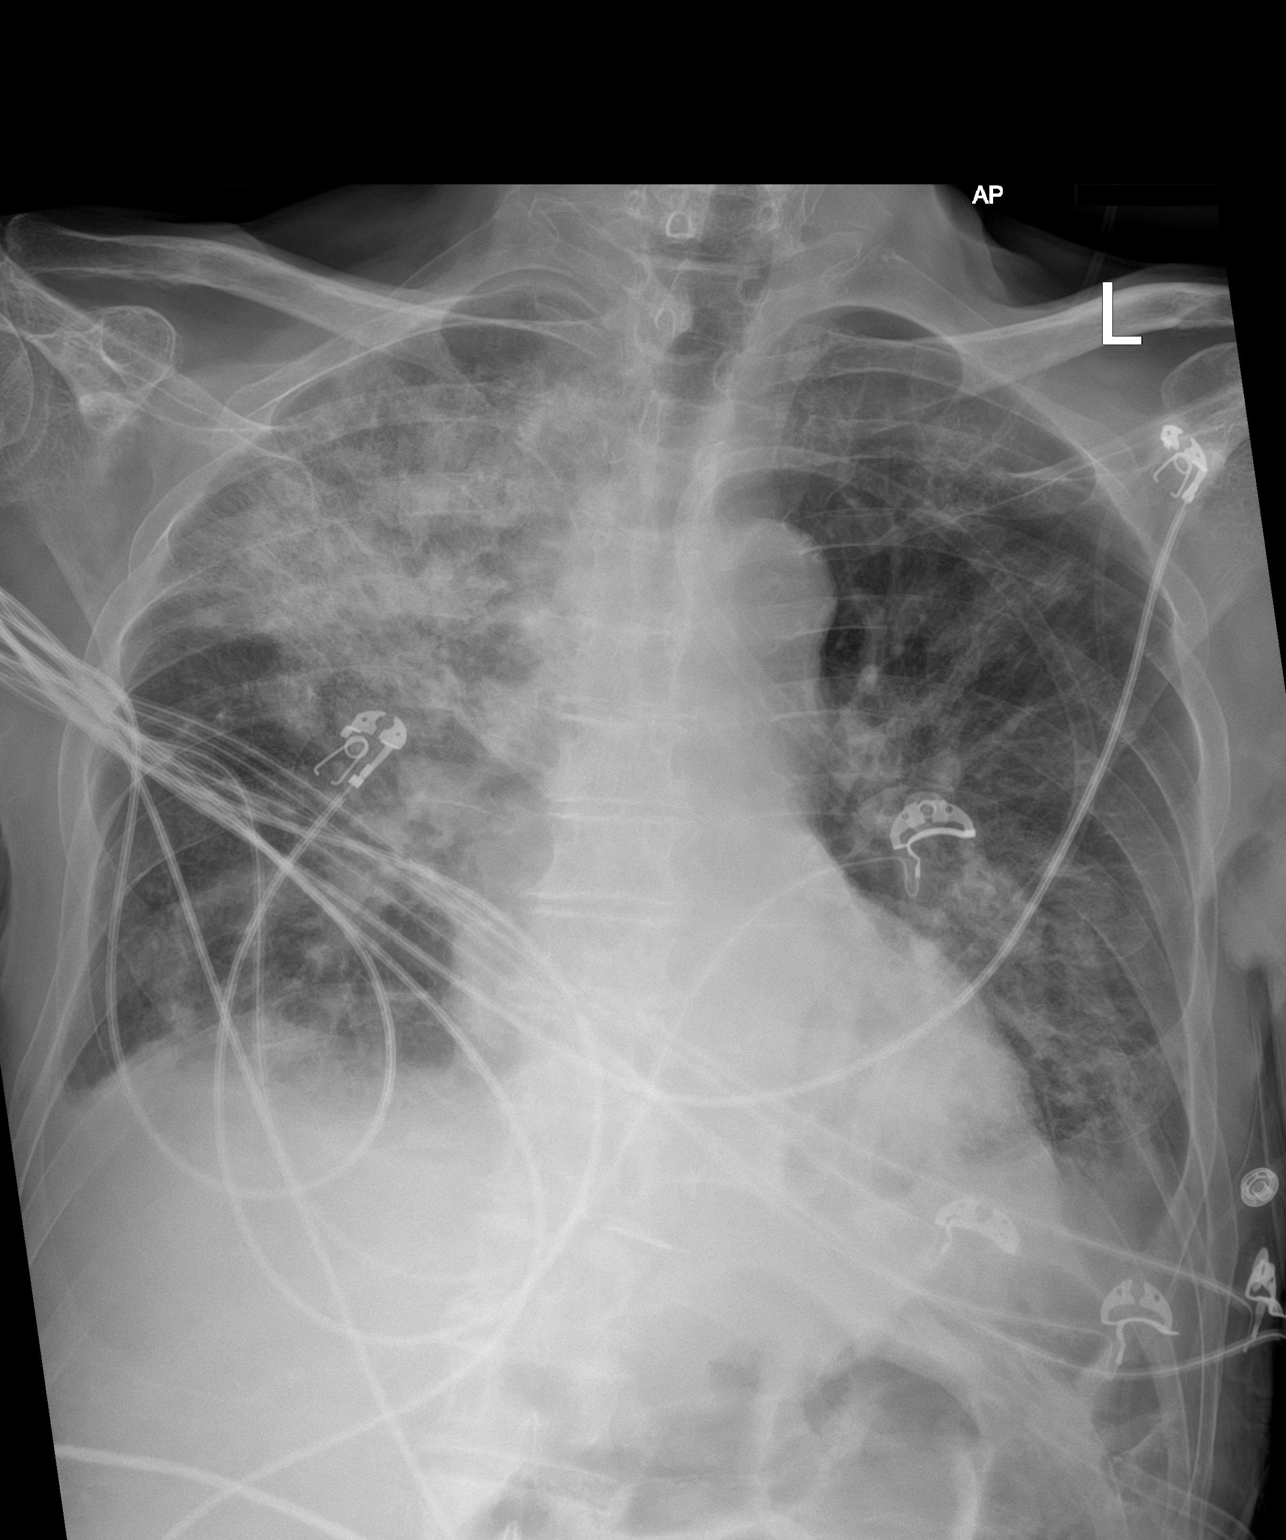

[1 of 1 positions shown; findings below may reference images not displayed]

FINDINGS: The heart and mediastinal contours are unchanged. Aortic
calcification.

Almost complete opacification of the right upper lobe. Patchy
airspace and interstitial opacity within the remainder of the lungs.
No pulmonary edema. At least bilateral trace pleural effusions. No
pneumothorax.

No acute osseous abnormality.

.
IMPRESSION: 1. Multifocal pneumonia. Followup PA and lateral chest X-ray is
recommended in 3-4 weeks following therapy to ensure resolution.
2.  At least bilateral trace pleural effusions.
3.  Aortic Atherosclerosis ([id number]).
